# Patient Record
Sex: Female | Born: 1935 | Race: White | Hispanic: No | Smoking: Never smoker
Health system: Southern US, Community
[De-identification: ages and names within clinical notes are randomized; demographics above are authoritative.]

## PROBLEM LIST (undated history)

## (undated) DIAGNOSIS — I509 Heart failure, unspecified: Secondary | ICD-10-CM

## (undated) DIAGNOSIS — D509 Iron deficiency anemia, unspecified: Secondary | ICD-10-CM

## (undated) DIAGNOSIS — I10 Essential (primary) hypertension: Secondary | ICD-10-CM

## (undated) DIAGNOSIS — E785 Hyperlipidemia, unspecified: Secondary | ICD-10-CM

## (undated) DIAGNOSIS — I2510 Atherosclerotic heart disease of native coronary artery without angina pectoris: Secondary | ICD-10-CM

## (undated) DIAGNOSIS — K922 Gastrointestinal hemorrhage, unspecified: Secondary | ICD-10-CM

## (undated) DIAGNOSIS — K449 Diaphragmatic hernia without obstruction or gangrene: Secondary | ICD-10-CM

## (undated) DIAGNOSIS — K219 Gastro-esophageal reflux disease without esophagitis: Secondary | ICD-10-CM

## (undated) HISTORY — DX: Diaphragmatic hernia without obstruction or gangrene: K44.9

## (undated) HISTORY — PX: CHOLECYSTECTOMY: SHX55

## (undated) HISTORY — DX: Gastrointestinal hemorrhage, unspecified: K92.2

## (undated) HISTORY — DX: Essential (primary) hypertension: I10

## (undated) HISTORY — DX: Hyperlipidemia, unspecified: E78.5

## (undated) HISTORY — DX: Iron deficiency anemia, unspecified: D50.9

## (undated) HISTORY — PX: APPENDECTOMY: SHX54

## (undated) HISTORY — DX: Gastro-esophageal reflux disease without esophagitis: K21.9

## (undated) HISTORY — DX: Heart failure, unspecified: I50.9

## (undated) HISTORY — DX: Atherosclerotic heart disease of native coronary artery without angina pectoris: I25.10

---

## 2003-02-18 ENCOUNTER — Ambulatory Visit (HOSPITAL_COMMUNITY): Admission: RE | Admit: 2003-02-18 | Discharge: 2003-02-18 | Payer: Self-pay | Admitting: Pulmonary Disease

## 2003-11-04 ENCOUNTER — Ambulatory Visit (HOSPITAL_COMMUNITY): Admission: RE | Admit: 2003-11-04 | Discharge: 2003-11-04 | Payer: Self-pay | Admitting: Pulmonary Disease

## 2003-11-18 ENCOUNTER — Ambulatory Visit (HOSPITAL_COMMUNITY): Admission: RE | Admit: 2003-11-18 | Discharge: 2003-11-18 | Payer: Self-pay | Admitting: Pulmonary Disease

## 2003-11-21 ENCOUNTER — Ambulatory Visit (HOSPITAL_COMMUNITY): Admission: RE | Admit: 2003-11-21 | Discharge: 2003-11-21 | Payer: Self-pay | Admitting: Pulmonary Disease

## 2004-11-04 ENCOUNTER — Ambulatory Visit (HOSPITAL_COMMUNITY): Admission: RE | Admit: 2004-11-04 | Discharge: 2004-11-04 | Payer: Self-pay

## 2004-12-23 ENCOUNTER — Ambulatory Visit (HOSPITAL_COMMUNITY): Admission: RE | Admit: 2004-12-23 | Discharge: 2004-12-23 | Payer: Self-pay | Admitting: Pulmonary Disease

## 2006-08-15 HISTORY — PX: COLONOSCOPY: SHX174

## 2006-12-19 ENCOUNTER — Ambulatory Visit (HOSPITAL_COMMUNITY): Admission: RE | Admit: 2006-12-19 | Discharge: 2006-12-19 | Payer: Self-pay | Admitting: Pulmonary Disease

## 2007-01-15 ENCOUNTER — Ambulatory Visit: Payer: Self-pay | Admitting: Internal Medicine

## 2007-01-24 ENCOUNTER — Ambulatory Visit (HOSPITAL_COMMUNITY): Admission: RE | Admit: 2007-01-24 | Discharge: 2007-01-24 | Payer: Self-pay | Admitting: Internal Medicine

## 2007-01-24 ENCOUNTER — Encounter: Payer: Self-pay | Admitting: Internal Medicine

## 2007-01-24 ENCOUNTER — Ambulatory Visit: Payer: Self-pay | Admitting: Internal Medicine

## 2008-06-20 ENCOUNTER — Ambulatory Visit (HOSPITAL_COMMUNITY): Admission: RE | Admit: 2008-06-20 | Discharge: 2008-06-20 | Payer: Self-pay | Admitting: Pulmonary Disease

## 2008-07-14 ENCOUNTER — Ambulatory Visit (HOSPITAL_COMMUNITY): Admission: RE | Admit: 2008-07-14 | Discharge: 2008-07-14 | Payer: Self-pay | Admitting: Ophthalmology

## 2008-10-06 ENCOUNTER — Encounter (INDEPENDENT_AMBULATORY_CARE_PROVIDER_SITE_OTHER): Payer: Self-pay | Admitting: *Deleted

## 2008-10-06 LAB — CONVERTED CEMR LAB
ALT: 11 units/L
AST: 18 units/L
Chloride: 5.2 meq/L
Creatinine, Ser: 0.89 mg/dL
Total Protein: 6.6 g/dL

## 2008-10-08 ENCOUNTER — Ambulatory Visit (HOSPITAL_COMMUNITY): Admission: RE | Admit: 2008-10-08 | Discharge: 2008-10-08 | Payer: Self-pay | Admitting: Pulmonary Disease

## 2008-10-08 ENCOUNTER — Ambulatory Visit: Payer: Self-pay | Admitting: Cardiology

## 2008-10-08 ENCOUNTER — Encounter (INDEPENDENT_AMBULATORY_CARE_PROVIDER_SITE_OTHER): Payer: Self-pay | Admitting: Pulmonary Disease

## 2008-10-10 ENCOUNTER — Ambulatory Visit (HOSPITAL_COMMUNITY): Admission: RE | Admit: 2008-10-10 | Discharge: 2008-10-10 | Payer: Self-pay | Admitting: Pulmonary Disease

## 2008-11-10 ENCOUNTER — Ambulatory Visit: Payer: Self-pay | Admitting: Cardiology

## 2008-11-11 ENCOUNTER — Inpatient Hospital Stay (HOSPITAL_COMMUNITY): Admission: AD | Admit: 2008-11-11 | Discharge: 2008-11-12 | Payer: Self-pay | Admitting: Internal Medicine

## 2008-11-11 ENCOUNTER — Ambulatory Visit: Payer: Self-pay | Admitting: Cardiovascular Disease

## 2008-11-11 ENCOUNTER — Ambulatory Visit: Payer: Self-pay | Admitting: Cardiology

## 2008-11-20 ENCOUNTER — Ambulatory Visit: Payer: Self-pay | Admitting: Cardiology

## 2008-11-28 ENCOUNTER — Ambulatory Visit: Payer: Self-pay | Admitting: Cardiology

## 2008-12-03 ENCOUNTER — Ambulatory Visit: Payer: Self-pay | Admitting: Cardiology

## 2008-12-22 ENCOUNTER — Encounter (INDEPENDENT_AMBULATORY_CARE_PROVIDER_SITE_OTHER): Payer: Self-pay | Admitting: *Deleted

## 2008-12-22 LAB — CONVERTED CEMR LAB
BUN: 24 mg/dL
Chloride: 103 meq/L
Creatinine, Ser: 0.85 mg/dL
Glucose, Bld: 76 mg/dL

## 2008-12-29 ENCOUNTER — Ambulatory Visit: Payer: Self-pay | Admitting: Cardiology

## 2009-03-02 ENCOUNTER — Ambulatory Visit (HOSPITAL_COMMUNITY): Admission: RE | Admit: 2009-03-02 | Discharge: 2009-03-02 | Payer: Self-pay | Admitting: Ophthalmology

## 2009-08-18 ENCOUNTER — Encounter: Payer: Self-pay | Admitting: *Deleted

## 2009-08-18 DIAGNOSIS — E7800 Pure hypercholesterolemia, unspecified: Secondary | ICD-10-CM

## 2009-08-18 DIAGNOSIS — I509 Heart failure, unspecified: Secondary | ICD-10-CM | POA: Insufficient documentation

## 2009-08-18 DIAGNOSIS — I10 Essential (primary) hypertension: Secondary | ICD-10-CM

## 2009-08-19 ENCOUNTER — Ambulatory Visit: Payer: Self-pay | Admitting: Cardiology

## 2009-08-19 ENCOUNTER — Encounter (INDEPENDENT_AMBULATORY_CARE_PROVIDER_SITE_OTHER): Payer: Self-pay | Admitting: *Deleted

## 2009-08-19 DIAGNOSIS — I5042 Chronic combined systolic (congestive) and diastolic (congestive) heart failure: Secondary | ICD-10-CM

## 2009-08-19 DIAGNOSIS — I2510 Atherosclerotic heart disease of native coronary artery without angina pectoris: Secondary | ICD-10-CM

## 2009-10-19 ENCOUNTER — Emergency Department (HOSPITAL_COMMUNITY): Admission: EM | Admit: 2009-10-19 | Discharge: 2009-10-20 | Payer: Self-pay | Admitting: Emergency Medicine

## 2010-03-31 ENCOUNTER — Ambulatory Visit: Payer: Self-pay | Admitting: Cardiology

## 2010-09-14 NOTE — Miscellaneous (Signed)
Summary: labs bmp,12/22/2008  Clinical Lists Changes  Observations: Added new observation of CALCIUM: 8.8 mg/dL (52/84/1324 40:10) Added new observation of CREATININE: 0.85 mg/dL (27/25/3664 40:34) Added new observation of BUN: 24 mg/dL (74/25/9563 87:56) Added new observation of BG RANDOM: 76 mg/dL (43/32/9518 84:16) Added new observation of CO2 PLSM/SER: 29 meq/L (12/22/2008 10:05) Added new observation of CL SERUM: 103 meq/L (12/22/2008 10:05) Added new observation of K SERUM: 4.4 meq/L (12/22/2008 10:05) Added new observation of NA: 138 meq/L (12/22/2008 10:05) Added new observation of CALCIUM: 9.5 mg/dL (60/63/0160 10:93) Added new observation of ALBUMIN: 4.4 g/dL (23/55/7322 02:54) Added new observation of PROTEIN, TOT: 6.6 g/dL (27/01/2375 28:31) Added new observation of SGPT (ALT): 11 units/L (10/06/2008 10:05) Added new observation of SGOT (AST): 18 units/L (10/06/2008 10:05) Added new observation of ALK PHOS: 72 units/L (10/06/2008 10:05) Added new observation of CREATININE: 0.89 mg/dL (51/76/1607 37:10) Added new observation of BUN: 27 mg/dL (62/69/4854 62:70) Added new observation of BG RANDOM: 72 mg/dL (35/00/9381 82:99) Added new observation of CO2 PLSM/SER: 104 meq/L (10/06/2008 10:05) Added new observation of CL SERUM: 5.2 meq/L (10/06/2008 10:05) Added new observation of K SERUM: 144 meq/L (10/06/2008 10:05)

## 2010-09-14 NOTE — Assessment & Plan Note (Signed)
Summary: Z6X   Visit Type:  Follow-up Primary Provider:  Dr.Edward Juanetta Gosling   History of Present Illness: Susan Glass returns today for evaluation management of her chronic combined congestive heart failure, nonobstructive coronary artery disease, moderate pulmonary hypertension, lower extremity edema, and hypertension.  She be 77 years of age this April. She remains very active and independent. She has had no chest discomfort, shortness of breath, her only limiting factor is her left knee.  In May 16, 2023, her husband died of lung cancer. She is very lonely and was tearful today. She lives alone. She does have a son and several grandchildren that check on her. Her memory is good and she is not prone to fall.  Current Medications (verified): 1)  Amlodipine Besylate 2.5 Mg Tabs (Amlodipine Besylate) .... Take One Tablet By Mouth Daily 2)  Furosemide 40 Mg Tabs (Furosemide) .... Take 1 Tab Daily 3)  Vitamin B-12 Cr 2000 Mcg Cr-Tabs (Cyanocobalamin) .... Take 1 Tab Alternating With 2 Next Day 4)  Vitamin D3 1000000 Unit/gm Liqd (Cholecalciferol) .... Take 1 Tab Daily 5)  Ferrous Sulfate 325 (65 Fe) Mg Tbec (Ferrous Sulfate) .... Take 1 Tab Daily 6)  Aspirin 325 Mg Tabs (Aspirin) .... Take 1/4 Tab Daily  Allergies (verified): No Known Drug Allergies  Past History:  Past Medical History: Last updated: 2009/09/05 Current Problems:  HYPERTENSION (ICD-401.9) HYPERCHOLESTEROLEMIA (ICD-272.0) CAD (ICD-414.00) CHF (ICD-428.0)  Past Surgical History: Last updated: 09-05-2009 cholecystectomy appendectomy  Family History: Last updated: 09/05/09 Father:deceased age 67 due to unatural causes Mother:deceased age 72 chf  Social History: Last updated: 09-05-09 Retired  Married  Tobacco Use - No.  Alcohol Use - no Regular Exercise - no Drug Use - no  Risk Factors: Exercise: no (2009-09-05)  Risk Factors: Smoking Status: never (2009-09-05)  Review of Systems       negative  other than history of present illness  Vital Signs:  Patient profile:   76 year old female Height:      61 inches Weight:      133 pounds Pulse rate:   71 / minute BP sitting:   136 / 76  (right arm)  Vitals Entered By: Dreama Saa, CNA (August 19, 2009 1:21 PM)  Physical Exam  General:  elderly, very sharp Head:  normocephalic and atraumatic Eyes:  PERRLA/EOM intact; conjunctiva and lids normal. Neck:  Neck supple, no JVD. No masses, thyromegaly or abnormal cervical nodes. Lungs:  Clear bilaterally to auscultation and percussion. Heart:  Non-displaced PMI, chest non-tender; regular rate and rhythm, S1, S2 without murmurs, rubs or gallops. Carotid upstroke normal, no bruit. Normal abdominal aortic size, no bruits. Femorals normal pulses, no bruits. Pedals normal pulses. No edema, no varicosities. Msk:  decreased ROM.   Pulses:  pulses normal in all 4 extremities Extremities:  No clubbing or cyanosis. Neurologic:  Alert and oriented x 3. Skin:  Intact without lesions or rashes. Psych:  Normal affect.   Problems:  Medical Problems Added: 1)  Dx of Cad, Native Vessel  (ICD-414.01) 2)  Dx of Combined Heart Failure, Chronic  (ICD-428.42)  Impression & Recommendations:  Problem # 1:  COMBINED HEART FAILURE, CHRONIC (ICD-428.42) Assessment Unchanged  Her updated medication list for this problem includes:    Amlodipine Besylate 2.5 Mg Tabs (Amlodipine besylate) .Marland Kitchen... Take one tablet by mouth daily    Furosemide 40 Mg Tabs (Furosemide) .Marland Kitchen... Take 1 tab daily    Aspirin 325 Mg Tabs (Aspirin) .Marland Kitchen... Take 1/4 tab daily  Problem # 2:  HYPERTENSION (ICD-401.9) Assessment: Improved  Her updated medication list for this problem includes:    Amlodipine Besylate 2.5 Mg Tabs (Amlodipine besylate) .Marland Kitchen... Take one tablet by mouth daily    Furosemide 40 Mg Tabs (Furosemide) .Marland Kitchen... Take 1 tab daily    Aspirin 325 Mg Tabs (Aspirin) .Marland Kitchen... Take 1/4 tab daily  Problem # 3:  CAD, NATIVE  VESSEL (ICD-414.01) Assessment: Unchanged  Her updated medication list for this problem includes:    Amlodipine Besylate 2.5 Mg Tabs (Amlodipine besylate) .Marland Kitchen... Take one tablet by mouth daily    Aspirin 325 Mg Tabs (Aspirin) .Marland Kitchen... Take 1/4 tab daily  Patient Instructions: 1)  Your physician recommends that you schedule a follow-up appointment in: 6 months

## 2010-09-14 NOTE — Assessment & Plan Note (Signed)
Summary: V4U   Visit Type:  Follow-up Primary Susan Glass:  Susan Glass  CC:  no cardiology complaints.  History of Present Illness: Susan Glass returns today for evaluation of her coronary disease, hypertension and palpitations.  She has no complaints today. Her husband died about 6 months ago and she now lives in an apartment which is made life a lot easier. She is quite lonely.  She denies any angina or chest pain. She still quite active. She goes to church almost every Sunday. She denies orthopnea, PND or edema. She has not fallen. She is very compliant with her medications.  Clinical Reports Reviewed:  Cardiac Cath:  11/11/2008: Cardiac Cath Findings:   IMPRESSION:   1. Mild nonobstructive coronary artery disease.   2. Mild global left ventricular systolic dysfunction.   3. Mild-to-moderate elevation in pulmonary artery pressure.   4. Elevated wedge pressure.      RECOMMENDATIONS:  I will transfer the patient to the telemetry unit   following the catheterization.  We will begin mild diuresis and aim for   better blood pressure control.  I would also like to perform a surface   echocardiogram prior to discharge to get another assessment of her   overall left ventricular systolic function.  This would also be helpful   to look for abnormal relaxation and possible diastolic dysfunction as an   etiology of her chest discomfort.  I also would like to have another   assessment of her mitral regurgitation.               Christopher McAlhany, MD   Electronically Signed    Current Medications (verified): 1)  Amlodipine Besylate 2.5 Mg Tabs (Amlodipine Besylate) .... Take One Tablet By Mouth Daily 2)  Furosemide 40 Mg Tabs (Furosemide) .... Take 1 Tab Daily 3)  Ferrous Sulfate 325 (65 Fe) Mg Tbec (Ferrous Sulfate) .... Take Prn 4)  Aspirin 325 Mg Tabs (Aspirin) .... Take 1/4 Tab Daily 5)  Fish Oil 1000 Mg Caps (Omega-3 Fatty Acids) .... Take 2 Tabs Daily  Allergies  (verified): No Known Drug Allergies  Review of Systems       negative other than history of present illness  Vital Signs:  Patient profile:   77 year old female Weight:      128 pounds BMI:     24 .27 Pulse rate:   41 / minute BP sitting:   141 / 70  (right arm)  Vitals Entered By: Dreama Saa, CNA (March 31, 2010 4:00 PM)  Physical Exam  General:  elderly, alert and oriented, in no acute distress Head:  normocephalic and atraumatic Eyes:  Lasix is otherwise normal Neck:  Neck supple, no JVD. No masses, thyromegaly or abnormal cervical nodes. Chest Wall:  no deformities or breast masses noted Lungs:  Clear bilaterally to auscultation and percussion. Heart:  PMI nondisplaced, irregular rate and rhythm, heart rate 60 Msk:  decreased ROM.   Pulses:  pulses normal in all 4 extremities Extremities:  trace left pedal edema and trace right pedal edema.   Neurologic:  Alert and oriented x 3. Skin:  Intact without lesions or rashes. Psych:  depressed affect.     Impression & Recommendations:  Problem # 1:  CAD, NATIVE VESSEL (ICD-414.01)  Her updated medication list for this problem includes:    Amlodipine Besylate 2.5 Mg Tabs (Amlodipine besylate) .Marland Kitchen... Take one tablet by mouth daily    Aspirin 325 Mg Tabs (Aspirin) .Marland Kitchen... Take 1/4 tab daily  Her  updated medication list for this problem includes:    Amlodipine Besylate 2.5 Mg Tabs (Amlodipine besylate) .Marland Kitchen... Take one tablet by mouth daily    Aspirin 325 Mg Tabs (Aspirin) .Marland Kitchen... Take 1/4 tab daily  Problem # 2:  COMBINED HEART FAILURE, CHRONIC (ICD-428.42)  Her updated medication list for this problem includes:    Amlodipine Besylate 2.5 Mg Tabs (Amlodipine besylate) .Marland Kitchen... Take one tablet by mouth daily    Furosemide 40 Mg Tabs (Furosemide) .Marland Kitchen... Take 1 tab daily    Aspirin 325 Mg Tabs (Aspirin) .Marland Kitchen... Take 1/4 tab daily  Her updated medication list for this problem includes:    Amlodipine Besylate 2.5 Mg Tabs (Amlodipine  besylate) .Marland Kitchen... Take one tablet by mouth daily    Furosemide 40 Mg Tabs (Furosemide) .Marland Kitchen... Take 1 tab daily    Aspirin 325 Mg Tabs (Aspirin) .Marland Kitchen... Take 1/4 tab daily  Problem # 3:  HYPERTENSION (ICD-401.9)  Her updated medication list for this problem includes:    Amlodipine Besylate 2.5 Mg Tabs (Amlodipine besylate) .Marland Kitchen... Take one tablet by mouth daily    Furosemide 40 Mg Tabs (Furosemide) .Marland Kitchen... Take 1 tab daily    Aspirin 325 Mg Tabs (Aspirin) .Marland Kitchen... Take 1/4 tab daily  Her updated medication list for this problem includes:    Amlodipine Besylate 2.5 Mg Tabs (Amlodipine besylate) .Marland Kitchen... Take one tablet by mouth daily    Furosemide 40 Mg Tabs (Furosemide) .Marland Kitchen... Take 1 tab daily    Aspirin 325 Mg Tabs (Aspirin) .Marland Kitchen... Take 1/4 tab daily  Problem # 4:  HYPERCHOLESTEROLEMIA (ICD-272.0)  Patient Instructions: 1)  Your physician recommends that you schedule a follow-up appointment in: 6 months 2)  Your physician recommends that you continue on your current medications as directed. Please refer to the Current Medication list given to you today.

## 2010-11-07 LAB — CBC
MCHC: 34.9 g/dL (ref 30.0–36.0)
MCV: 93.7 fL (ref 78.0–100.0)
Platelets: 162 10*3/uL (ref 150–400)
RDW: 14.5 % (ref 11.5–15.5)

## 2010-11-07 LAB — POCT CARDIAC MARKERS
CKMB, poc: 2.1 ng/mL (ref 1.0–8.0)
Myoglobin, poc: 88 ng/mL (ref 12–200)

## 2010-11-07 LAB — DIFFERENTIAL
Basophils Absolute: 0 10*3/uL (ref 0.0–0.1)
Basophils Relative: 0 % (ref 0–1)
Eosinophils Absolute: 0.1 10*3/uL (ref 0.0–0.7)
Neutro Abs: 3.9 10*3/uL (ref 1.7–7.7)
Neutrophils Relative %: 60 % (ref 43–77)

## 2010-11-07 LAB — BASIC METABOLIC PANEL
BUN: 19 mg/dL (ref 6–23)
CO2: 27 mEq/L (ref 19–32)
Calcium: 8.8 mg/dL (ref 8.4–10.5)
Creatinine, Ser: 0.84 mg/dL (ref 0.4–1.2)
GFR calc Af Amer: >60 mL/min (ref >60)
Glucose, Bld: 103 mg/dL — ABNORMAL HIGH (ref 70–99)

## 2010-11-17 ENCOUNTER — Encounter: Payer: Self-pay | Admitting: Adult Health

## 2010-11-17 ENCOUNTER — Ambulatory Visit (INDEPENDENT_AMBULATORY_CARE_PROVIDER_SITE_OTHER): Payer: Medicare Other | Admitting: Adult Health

## 2010-11-17 DIAGNOSIS — R609 Edema, unspecified: Secondary | ICD-10-CM

## 2010-11-17 DIAGNOSIS — R600 Localized edema: Secondary | ICD-10-CM | POA: Insufficient documentation

## 2010-11-17 DIAGNOSIS — I2510 Atherosclerotic heart disease of native coronary artery without angina pectoris: Secondary | ICD-10-CM

## 2010-11-17 DIAGNOSIS — I5042 Chronic combined systolic (congestive) and diastolic (congestive) heart failure: Secondary | ICD-10-CM

## 2010-11-17 DIAGNOSIS — I10 Essential (primary) hypertension: Secondary | ICD-10-CM

## 2010-11-17 NOTE — Patient Instructions (Signed)
Your physician recommends that you schedule a follow-up appointment in: 6 months Your physician recommends that you return for lab work in: today Your physician recommends that you continue on your current medications as directed. Please refer to the Current Medication list given to you today.

## 2010-11-17 NOTE — Progress Notes (Signed)
   Patient ID: Susan Glass, female    DOB: 1917-07-02, 77 y.o.   MRN: [id number]  HPI Susan Glass is a pleasant lady we are following for ongoing assessment and treatment of CAD, hypertension, and palpitations. She has a history of nonobstructive CAD per cardiac cath in 2010 but was found to have mild diastolic dysfunction.  She also has a history of debilitating arthritis which causes her to be quite sedentary.  She comes today with complaints of LEE that has been ongoing for about one month.  She has also had some skin cancer lesions removed from her lower extremities around the ankles bilaterally.  She is complaining of some discomfort with that as well.  She denies chest pain, shortness of breath, dizziness.  She is a little anxious, but once we talk a while she calms down.   Upon review of her medications, it is found that she is taking Alleve twice a day. She uses this for arthritis.  This is a new medication for her, as she was taking Tylenol Arthritis.    Review of Systems Review of systems complete and found to be negative unless listed above    Physical Exam General: Well developed, well nourished, in no acute distress Head: Eyes PERRLA, No xanthomas.   Normal cephalic and atramatic  Lungs: Clear bilaterally to auscultation and percussion. Heart: HRRR S1 S2,1/6 systolic murmur..  Pulses are 2+ & equal.            No carotid bruit. No JVD.  No abdominal bruits. No femoral bruits. Abdomen: Bowel sounds are positive, abdomen soft and non-tender without masses or                  Hernia's noted. Msk:  Kyphosis is noted, normal gait. Normal strength and tone for age. Healing skin wounds from            Recent removal of skin cancer lesions on her ankles. Extremities: No clubbing, cyanosis or edema.  DP +1 Neuro: Alert and oriented X 3. Psych:  Good affect, responds appropriately    Current Outpatient Prescriptions on File Prior to Visit  Medication Sig Dispense Refill  . amLODipine  (NORVASC) 2.5 MG tablet Take 2.5 mg by mouth daily.        . ferrous sulfate 325 (65 FE) MG tablet Take 325 mg by mouth daily with breakfast.        . furosemide (LASIX) 40 MG tablet Take 40 mg by mouth daily.        . Omega-3 Fatty Acids (FISH OIL) 1000 MG CAPS Take 1 capsule by mouth daily.        Marland Kitchen DISCONTD: aspirin 325 MG tablet Take 81 mg by mouth daily.

## 2010-11-17 NOTE — Assessment & Plan Note (Signed)
She is stable from cardiac standpoint.  No recurrence of chest pain, no increased DOE.  She is to continue current medications as directed.

## 2010-11-17 NOTE — Assessment & Plan Note (Signed)
Currently well controled.  She wants to go up on the lasix and I have advised against this, since she is stopping the Alleve. I do not want her to become dehydrated or hypotensive.

## 2010-11-18 LAB — BASIC METABOLIC PANEL
BUN: 23 mg/dL (ref 6–23)
Creat: 0.87 mg/dL (ref 0.40–1.20)
Glucose, Bld: 93 mg/dL (ref 70–99)

## 2010-11-21 LAB — HEMOGLOBIN AND HEMATOCRIT, BLOOD
HCT: 37.6 % (ref 36.0–46.0)
Hemoglobin: 13 g/dL (ref 12.0–15.0)

## 2010-11-21 LAB — BASIC METABOLIC PANEL
CO2: 28 mEq/L (ref 19–32)
Calcium: 9.2 mg/dL (ref 8.4–10.5)
Creatinine, Ser: 0.85 mg/dL (ref 0.4–1.2)
GFR calc Af Amer: >60 mL/min (ref >60)
GFR calc non Af Amer: >60 mL/min (ref >60)
Glucose, Bld: 120 mg/dL — ABNORMAL HIGH (ref 70–99)
Sodium: 142 mEq/L (ref 135–145)

## 2010-11-25 ENCOUNTER — Telehealth: Payer: Self-pay | Admitting: Adult Health

## 2010-11-25 LAB — POCT I-STAT 3, ART BLOOD GAS (G3+)
O2 Saturation: 91 %
TCO2: 23 mmol/L (ref 0–100)
pCO2 arterial: 36.6 mmHg (ref 35.0–45.0)
pH, Arterial: 7.383 (ref 7.350–7.400)

## 2010-11-25 LAB — POCT I-STAT 3, VENOUS BLOOD GAS (G3P V)
O2 Saturation: 63 %
TCO2: 24 mmol/L (ref 0–100)
pCO2, Ven: 38.9 mmHg — ABNORMAL LOW (ref 45.0–50.0)
pH, Ven: 7.383 — ABNORMAL HIGH (ref 7.250–7.300)

## 2010-11-25 LAB — CBC
HCT: 37.1 % (ref 36.0–46.0)
Hemoglobin: 12.7 g/dL (ref 12.0–15.0)
MCHC: 34.2 g/dL (ref 30.0–36.0)
MCV: 92.4 fL (ref 78.0–100.0)
Platelets: 123 10*3/uL — ABNORMAL LOW (ref 150–400)
Platelets: 135 10*3/uL — ABNORMAL LOW (ref 150–400)
RDW: 13.4 % (ref 11.5–15.5)
RDW: 13.7 % (ref 11.5–15.5)
WBC: 6.1 10*3/uL (ref 4.0–10.5)

## 2010-11-25 LAB — HEPARIN LEVEL (UNFRACTIONATED): Heparin Unfractionated: <0.1 IU/mL — ABNORMAL LOW (ref 0.30–0.70)

## 2010-11-25 LAB — COMPREHENSIVE METABOLIC PANEL
Albumin: 3.3 g/dL — ABNORMAL LOW (ref 3.5–5.2)
Alkaline Phosphatase: 58 U/L (ref 39–117)
BUN: 18 mg/dL (ref 6–23)
Creatinine, Ser: 0.79 mg/dL (ref 0.4–1.2)
Glucose, Bld: 82 mg/dL (ref 70–99)
Total Protein: 5.6 g/dL — ABNORMAL LOW (ref 6.0–8.3)

## 2010-11-25 LAB — LIPID PANEL
LDL Cholesterol: 76 mg/dL (ref 0–99)
Total CHOL/HDL Ratio: 3.3 RATIO
Triglycerides: 68 mg/dL (ref <150)
VLDL: 14 mg/dL (ref 0–40)

## 2010-11-25 LAB — CARDIAC PANEL(CRET KIN+CKTOT+MB+TROPI)
CK, MB: 2.7 ng/mL (ref 0.3–4.0)
Troponin I: <0.01 ng/mL (ref 0.00–0.06)

## 2010-11-25 LAB — PROTIME-INR
INR: 1.2 (ref 0.00–1.49)
Prothrombin Time: 15.3 seconds — ABNORMAL HIGH (ref 11.6–15.2)

## 2010-11-25 LAB — BASIC METABOLIC PANEL
BUN: 15 mg/dL (ref 6–23)
Calcium: 8.5 mg/dL (ref 8.4–10.5)
Creatinine, Ser: 0.87 mg/dL (ref 0.4–1.2)
GFR calc non Af Amer: >60 mL/min (ref >60)
Glucose, Bld: 91 mg/dL (ref 70–99)

## 2010-11-25 LAB — APTT: aPTT: 33 seconds (ref 24–37)

## 2010-11-25 NOTE — Telephone Encounter (Signed)
**Note De-Identified  Obfuscation** Telephone number provided [phone]) has been disconnected, results letter mailed to patient's address with a note asking pt. to contact this office with current phone number.

## 2010-11-25 NOTE — Telephone Encounter (Signed)
PT CALLING FOR LAB RESULTS

## 2010-11-30 ENCOUNTER — Telehealth: Payer: Self-pay | Admitting: Adult Health

## 2010-11-30 LAB — BLOOD GAS, ARTERIAL
FIO2: 0.21 %
Patient temperature: 37
TCO2: 21 mmol/L (ref 0–100)
pCO2 arterial: 34.8 mmHg — ABNORMAL LOW (ref 35.0–45.0)
pH, Arterial: 7.453 — ABNORMAL HIGH (ref 7.350–7.400)

## 2010-11-30 NOTE — Telephone Encounter (Signed)
Patient would like results of lab work / tg  °

## 2010-12-01 ENCOUNTER — Other Ambulatory Visit: Payer: Self-pay | Admitting: Cardiology

## 2010-12-28 NOTE — Procedures (Signed)
NAME:  Susan Glass, Susan Glass                ACCOUNT NO.:  [id number]   MEDICAL RECORD NO.:  [id number]          PATIENT TYPE:  INP   LOCATION:  2002                         FACILITY:  [facility]   PHYSICIAN:  Jesse Sans. Wall, MD, FACCDATE OF BIRTH:  01-21-1917   DATE OF PROCEDURE:  11/11/2008  DATE OF DISCHARGE:                                  STRESS TEST   INDICATIONS:  PVCs and dyspnea on exertion.   Susan Glass had a baseline blood pressure of 168/66.  Based on EKG, it  was normal except for PVCs.  At one point, she had ventricular bigeminy  which we have been seeing in the office the day before.   She exercised in a modified Bruce protocol.  One minute and 48 seconds  in exercise she experienced dizziness.  At that point, her heart rate  was about 110 which is close to 85% of predicted maximum.  There were no  ST-segment changes though her ectopy remained.  Right after we stopped  the treadmill, she had 1 brief period where she was having no  ventricular ectopy.   At the end of exercise, her blood pressure had dropped to 142/80.   About 2 minutes into recovery, she was back in ventricular bigeminy with  no ST-segment changes.  Her blood pressures came back up.   ASSESSMENT:  1. Extreme poor exercise tolerance.  2. Decrease in blood pressure with dizziness and shortness of breath,      rule out severe coronary artery disease.  3. No suppression of ventricular bigeminy with exercise up to a heart      rate of 110.  4. No ST-segment changes.   In retrospect, Susan Glass tells me she has been taking Actifed.  She  took 1 this morning for a cold.  In addition, she also tells me now she  has had some chest tightness off and on during the night.  I am very  concerned about severe coronary artery disease.   PLAN:  Transfer to [facility] for cardiac catheterization.  [facility] has  been notified and will arrange transport to a step-down bed.  We will go  ahead and heparinize.  She takes  aspirin.      Thomas C. Daleen Squibb, MD, [facility]  Electronically Signed     TCW/MEDQ  D:  11/11/2008  T:  11/12/2008  Job:  [id number]

## 2010-12-28 NOTE — H&P (Signed)
NAME:  Susan, Glass                ACCOUNT NO.:  [id number]   MEDICAL RECORD NO.:  [id number]          PATIENT TYPE:  AMB   LOCATION:  DAY                           FACILITY:  [facility]   PHYSICIAN:  R. Roetta Sessions, M.D. DATE OF BIRTH:  01-10-1917   DATE OF ADMISSION:  DATE OF DISCHARGE:  LH                              HISTORY & PHYSICAL   REASON FOR CONSULTATION:  Abdominal discomfort, diarrhea, iron  deficiency anemia.   HISTORY OF PRESENT ILLNESS:  [name]. Susan Glass is a very pleasant 13-  year-old Caucasian female with no prior GI issues aside from getting her  gallbladder out many years ago who, over the past couple of months,  developed abdominal discomfort and cramps and nonbloody diarrhea.  She  temporarily relates these symptoms to taking a nonsteroidal agent (query  meloxicam) for arthritis.  She stopped taking the medication and the  above-mentioned GI symptoms have resolved.  She has not had any melena  or rectal bleeding, although Dr. Juanetta Gosling documented her to be iron  deficient back in January with iron level of 29, saturation 8%, ferritin  10, H&H was 10.6 and 33.4, MCV was 83.9.  She was also of B12 deficiency  now on supplementation. She denies melena or hematochezia.  As far as I  known, she is not known to be hemoccult positive.  She has not lost any  weight.  She has never had her lower GI tract evaluated.  There is no  family history of colorectal neoplasia.  She denies any outpatient  dysphagia or early signs of reflux symptoms, nausea or vomiting.   PAST MEDICAL HISTORY:  She has enjoyed very good health over the years.  She is status post cholecystectomy many years ago, status post  appendectomy many years ago.   CURRENT MEDICATIONS:  1. B12 supplement.  2. Fish oil supplement.  3. ASA 81 mg daily.  4. Fluid pill daily.   ALLERGIES:  NO KNOWN DRUG ALLERGIES   FAMILY HISTORY:  Mother died with congestive heart failure age 62.  Father died with old age  at age 61.  One sister 66 with Alzheimer's  disease.  There is no history chronic GI or liver illness.   SOCIAL HISTORY:  The patient is married, has one son.  She lost her  first husband to cancer at 76 years of marriage.  She is currently her  second marriage of 14 years duration.  No tobacco, no alcohol, no  illicit drugs.   REVIEW OF SYSTEMS:  She walks two miles every day.  She is a Futures trader.  No chest pain.  No dyspnea on exertion.   PHYSICAL EXAMINATION:  GENERAL:  Reveals a pleasant, 77 year old lady,  alert, conversant, oriented.  VITAL SIGNS:  Weight 130.5, temperature 98.1, BP 148/80, pulse 64.  SKIN:  Warm and dry.  There is no jaundice or stigmata of chronic liver  disease.  HEENT:  No scleral icterus. JVD is not prominent.  CHEST:  Lungs are clear to auscultation.  CARDIAC:  Regular rate and rhythm without murmur, rub.  BREAST:  Exams  deferred.  ABDOMEN:  Nondistended.  Positive bowel sounds, soft, nontender without  appreciable mass or organomegaly.  EXTREMITIES:  No edema.  RECTAL:  Deferred for time of colonoscopy.   IMPRESSION:  [name] is a pleasant 77 year old lady with a  history of microcytic anemia.  She has had some nonspecific abdominal  symptoms and diarrhea, at least temporally related to what is believed  nonsteroidal therapy.  This agent was withdrawn, and the above-mentioned  GI symptoms have resolved.  I am going to start with the fact that this  lady has never had a colonoscopy.  It is certainly possible, if she was  taking a nonsteroidal this could cause upheaval to her entire GI tract  and could have caused enteritis or even a colitis with nonbloody  diarrhea.   It is certainly also possible she could have suffered NSAID gastropathy  with nonsteroidal administration.  However, symptoms she originally  complained of have now unfortunately resolved.   I told [name] she ought to go ahead and have a colonoscopy.  Potential risks,  benefits, alternatives have been reviewed, questions  answered.  She is agreeable.  Her questions were answered.  Will set up  a colonoscopy in the very near future.   I would like to thank Dr. Juanetta Gosling for allowing me to see this nice lady  today.   Please note this lady was originally scheduled on Dec 26, 2006, but she  got her appointments fouled up and inadvertently missed her appointment  in early May.      Jonathon Bellows, M.D.  Electronically Signed     RMR/MEDQ  D:  01/15/2007  T:  01/15/2007  Job:  [id number]   cc:   Ramon Dredge L. Juanetta Gosling, M.D.  Fax: [phone]

## 2010-12-28 NOTE — Assessment & Plan Note (Signed)
[facility]                       [place] CARDIOLOGY OFFICE NOTE   NAME:Susan Glass, Susan Glass                       MRN:          [id number]  DATE:12/03/2008                            DOB:          28-May-1917    Ms. Turay comes in today as a walk-in for chest discomfort that lasted  since 1:30 this morning.  She said she felt like she had a brick  pressure on her chest.   She denied any nausea, vomiting, diaphoresis, or shortness of breath.  She denied any reflux symptoms such as water brash or heartburn.  She  denies any melena or hematochezia.   She did not get her amlodipine filled it 2.5 mg a day as we had asked  her.  She has been eating a lot of potassium-laden fruits and her  potassium is 5.9 on December 01, 2008.  Her BUN was 30 and creatinine was  0.87.  We advised her to cut way back on her potassium.   MEDICATIONS:  She is currently on;  1. Aspirin 81 mg a day.  2. Furosemide 20-40 mg every other day.   PHYSICAL EXAMINATION:  VITAL SIGNS:  Today, her blood pressure is  136/70, her pulse is 60 and she is again in ventricular bigeminy by EKG  with no acute changes. Weight is 128 and stable.  GENERAL:  She is in no acute distress.  SKIN:  Warm and dry.  HEENT:  Unchanged.  NECK:  Carotid upstrokes are equal bilaterally without bruits.  Thyroid  is not enlarged.  Trachea is midline.  LUNGS:  Clear to auscultation and percussion with no rub and no absent  breath sounds.  HEART:  A irregular rate and rhythm.  No gallop.  There is no rub.  ABDOMEN:  Soft, good bowel sounds.  No tenderness.  EXTREMITIES:  No cyanosis, clubbing, or edema.  There is no sign of DVT.  NEURO:  Intact.   ASSESSMENT:  Chest discomfort with minimal coronary artery disease.  Her  EKG shows no ST-segment changes, just persistent ventricular bigeminy  which has been present since the initial evaluation.  I suspect, she has  had some esophageal spasm or reflux.    PLAN:  1. Begin amlodipine 2.5 mg a day for blood pressure and for esophageal      spasm.  2. Begin omeprazole 40 mg p.o. daily  3. Tums p.r.n. which she has at home.  4. Continue with her other medications, except cut back on potassium-      rich fruits and      vegetables.  5. Check a BMET in a week.     Thomas C. Daleen Squibb, MD, [facility]  Electronically Signed    TCW/MedQ  DD: 12/03/2008  DT: 12/03/2008  Job #: [id number]   cc:   Ramon Dredge L. Juanetta Gosling, M.D.

## 2010-12-28 NOTE — Assessment & Plan Note (Signed)
[facility]                       [place] CARDIOLOGY OFFICE NOTE   NAME:Susan Glass, Susan Glass                       MRN:          [id number]  DATE:11/10/2008                            DOB:          10/19/1916    Susan Glass is a delightful 77 year old younger appearing than stated  age, married white female, who comes in today with shortness of breath  and chest discomfort.  Her real complaint is shortness of breath.   She saw Dr. Juanetta Gosling and has had pulmonary function studies, which were  normal.  A chest x-ray a year ago was nonrevealing.  In addition, she  has had a 2-D echocardiogram, which also was normal except for some mild  increase in her pulmonary pressures, being around a peak of 42 mmHg, and  mildly reduced right ventricular systolic function.  She has some mild  mitral regurgitation, mild-to-moderate enlargement of her left atrium.  Inferior vena cava was upper limits of normal and respiratory phasic  changes were blunted.   She does not smoke.  She has not had a history of any arrhythmias  including atrial fib.  There is no history of pulmonary emboli.   Her biggest complaint is that of giving out with shortness of breath  about a quarter mile into her walk with her husband.  They walked at  relatively slow pace, but she has always been able to walk further than  this.  On one occasion, she had some uncomfortable feeling in the center  of her chest, which she cannot really describe per se.  It was certainly  of about 30 minutes duration, which slowly went away.  Rest of the time,  she has not had this.   At time, she will have this discomfort just sitting and doing nothing.   She denies any nausea, vomiting, or diaphoresis.  She has had no  hemoptysis, hematemesis, difficulty swallowing, gastroesophageal reflux,  change in bowel habits, and melena.   PAST MEDICAL HISTORY:  She has no known drug allergies.   MEDICATIONS:  She  currently takes:  1. Aspirin 81 mg a day.  2. Torsemide 50 mg a day for lower extremity edema, which she has been      on for some time she says.   The only surgery she lists is colonoscopy.   SOCIAL HISTORY:  She does not smoke or drink.  She is retired.  She has  lost her first husband with a cancer and has been remarried for 16  years.  She says they are still on their honeymoon.  She has 1 child.   FAMILY HISTORY:  Really noncontributory at this point.   REVIEW OF SYSTEMS:  She has had left eye cataract surgery.  She wears  glasses or corrective lenses.  She wears bilateral hearing aids.  Her  teeth are in good shape and she has no dentures.  The rest of her review  of systems are totally negative except for some arthritis in her knees.   PHYSICAL EXAMINATION:  VITAL SIGNS:  Blood pressure 140/74, her pulse is  81 and irregular.  HEENT:  Atraumatic, normocephalic.  She is mildly overweight.  HEENT:  Essentially normal otherwise.  NECK:  Carotids were equal bilateral without bruits.  Thyroid is not  enlarged.  Trachea is midline.  There is no obvious JVD.  LUNGS:  Remarkable for some expiratory crackles in both bases.  She has  some minimal wheezing.  She has a slight cough and a nasal drip.  HEART:  Normal S1 and S2 with a irregular rhythm consistent with  bigeminy.  ABDOMEN:  Soft.  Good bowel sounds.  No midline bruit.  No hepatomegaly.  EXTREMITIES:  No major edema maybe trace to 1+.  She has a lot of venous  varicosities.  No sign of DVT.  No calf tenderness.  Pulses were brisk  2+ or 4+, but dorsalis pedis and posterior tibial.  NEUROLOGIC:  Grossly intact.   EKG confirms sinus rhythm with ventricular bigeminy, which is of  unifocal source.  She has some left axis deviation.  There is no ST-  segment changes.  PR, QRS, and QTc intervals are otherwise normal.   ASSESSMENT:  Dyspnea on exertion.  Some of this or all of this could be  due to her ventricular bigeminy with  ineffective heart rate of about 40.  It would be interesting to see what she does with increased activity and  reproduce her symptoms.  She could also have some obstructive coronary  artery disease, considering her age and then 1 or 2 episodes of chest  tightness or discomfort.   PLAN:  GXT, modified, by me, to see the response of her heart rate and  whether her ventricular ectopy worsens or goes away.  I would like to  try to reproduce her symptoms to correlate.   This is not felt to be the major cause after the treadmill, we will  proceed with cardiac catheterization.  She is agreeable.     Thomas C. Daleen Squibb, MD, [facility]  Electronically Signed    TCW/MedQ  DD: 11/10/2008  DT: 11/11/2008  Job #: [id number]   cc:   Ramon Dredge L. Juanetta Gosling, M.D.

## 2010-12-28 NOTE — Assessment & Plan Note (Signed)
[facility]                       [place] CARDIOLOGY OFFICE NOTE   NAME:Susan Glass, Susan Glass                       MRN:          [id number]  DATE:12/29/2008                            DOB:          Feb 21, 1917    Ms. Menendez returns today for her dyspnea, elevated pulmonary capillary  wedge pressure, nonobstructive coronary disease, and hypertension.   She says she feels remarkably better.  She denies any orthopnea, PND,  but does have a little bit of pedal edema, particularly in the left  lower extremity at the end of the day.  It goes down at night.  Her  dyspnea is improved, and her blood pressure readings that she brings in  today are remarkably better.  Her average pressure is now running about  125-130 systolic.  Diastolics are in the 60s and 70s.  Her heart rate  again in the mornings is running in the high 40s but during the today is  in the 60s.   She is on amlodipine 2.5 mg a day, omeprazole 40 mg a day, iron,  furosemide 20-40 mg every other day, and aspirin 81 mg.   PHYSICAL EXAMINATION:  GENERAL:  She is in no acute distress.  VITAL SIGNS:  Respirations 18 and unlabored.  NECK:  No JVD.  Carotid upstrokes are equal bilateral bruits.  Thyroid  is not enlarged.  Trachea is midline.  CHEST:  Lungs are clear to auscultation and percussion.  Heart reveals a  normal S1 and S2.  No gallop.  ABDOMEN:  Soft, good bowel sounds.  EXTREMITIES:  Pitting 1+ edema on the left lower extremity, trace on the  right.  Pulses are present bilaterally symmetrical.  NEUROLOGIC:  Intact.  Gait is slow but steady.   ASSESSMENT AND PLAN:  Ms. Joo is doing remarkably better both in  terms of her blood pressure as well as her symptoms of dyspnea.  We will  plan on seeing her back again in 6 months.   She would like to stop her omeprazole.  We had prescribed this back in  the past for what we thought were reflux symptoms.  I have told her to  take Tums p.r.n.  which she prefers, but if she starts having more  symptoms of chest pain she will go back on the omeprazole.     Thomas C. Daleen Squibb, MD, [facility]  Electronically Signed    TCW/MedQ  DD: 12/29/2008  DT: 12/30/2008  Job #: [id number]   cc:   Ramon Dredge L. Juanetta Gosling, M.D.

## 2010-12-28 NOTE — Assessment & Plan Note (Signed)
[facility]                       [place] CARDIOLOGY OFFICE NOTE   Glass, Susan Glass                       MRN:          [id number]  DATE:11/28/2008                            DOB:          03/13/1917    Susan Glass comes back today after undergoing cardiac catheterization for  significant dyspnea on exertion with reduced exercise tolerance and a  drop in her blood pressure with exercise.  She also has ventricular  bigeminy which seems to be permanent.  She had normal 2-D echocardiogram  prior to that.   She did not tolerate metoprolol and it did not have any effect on her  PVCs as well.   Her catheterization showed minimal coronary artery disease with elevated  left ventricular end-diastolic pressure as well as some mild-to-moderate  pulmonary hypertension.   Since going home on furosemide 40 mg a day, she feels remarkably better.  Her shortness of breath is improved.   Unfortunately, her blood pressure has been running about 170-180  systolic and over 100 diastolic when she first gets up in the morning.  This is not with activity per se.  When she sits down and rest, it comes  back down.   She denies any PND, orthopnea, or peripheral edema.  She has had no  syncope.   PHYSICAL EXAMINATION:  VITAL SIGNS:  Her blood pressure today is 136/70,  pulse is 60 and she is in ventricular bigeminy once again.  Her weight  is 128 and stable.  HEENT:  Unchanged, unremarkable.  NECK:  There is no JVD.  Thyroid is not enlarged.  LUNGS:  Clear to auscultation and percussion.  HEART:  An irregular rate and rhythm consistent with ventricular  bigeminy.  ABDOMEN:  Soft.  EXTREMITIES:  No edema.  Pulses are present.  NEUROLOGIC:  Grossly intact.   EKG again shows the ventricular bigeminy.   ASSESSMENT:  1. Dyspnea on exertion, which is now improved by lowering her wedge      pressure with a diuretic.  2. Hypertension, which appears to be at rest.  3. Relatively asymptomatic ventricular bigeminy and held by beta-      blockade.   PLAN:  1. Check BMET for potential potassium replacement.  2. Begin amlodipine 2.5 mg p.o. q.a.m.  I have told her son that if      her blood pressure continues to run 160-170 range, we need to be      notified.  At that point, we would increase      her amlodipine to 5 mg a day.  I do want to be too aggressive with      her considering her age.  3. Follow with me in 4-6 weeks.     Thomas C. Daleen Squibb, MD, [facility]  Electronically Signed    TCW/MedQ  DD: 11/28/2008  DT: 11/29/2008  Job #: [id number]   cc:   Ramon Dredge L. Juanetta Gosling, M.D.

## 2010-12-28 NOTE — Cardiovascular Report (Signed)
NAME:  Susan Glass, Susan Glass                ACCOUNT NO.:  [id number]   MEDICAL RECORD NO.:  [id number]          PATIENT TYPE:  INP   LOCATION:  2002                         FACILITY:  [facility]   PHYSICIAN:  Verne Carrow, MDDATE OF BIRTH:  1916-10-14   DATE OF PROCEDURE:  DATE OF DISCHARGE:                            CARDIAC CATHETERIZATION   PRIMARY CARDIOLOGIST:  Maisie Fus C. Wall, MD, [facility]   PROCEDURE PERFORMED:  1. Left heart catheterization.  2. Selective coronary angiography.  3. Right heart catheterization  4. Left ventricular angiogram.   OPERATOR:  Verne Carrow, MD   INDICATIONS:  Chest pain and dyspnea on exertion in a 77 year old female  who has no prior cardiac history and no prior cardiac workup.  The  patient exercised on a treadmill earlier this morning in our regional  office with Dr. Valera Castle supervising and developed shortness of  breath and chest pain on the treadmill with a slight drop in her  systolic blood pressure and a rapid increase in her heart rate with 2  minutes of exercise.  There was some concern for multivessel coronary  artery disease, and the patient was transferred to [facility]  for diagnostic left and right heart catheterization today.   DETAILS OF PROCEDURE:  The patient was brought to the inpatient cardiac  catheterization laboratory after signing informed consent for the  procedure.  The right groin was prepped and draped in sterile fashion.  1% lidocaine was used for local anesthesia.  Initially, a 5-French  sheath was inserted into the right femoral artery without difficulty.  Selective coronary angiography was performed with standard diagnostic  catheters.  A left ventricular angiogram was performed with a 5-French  pigtail catheter.  Following the left heart catheterization, I felt that  it was important to perform a right heart catheterization to assess her  pulmonary artery pressures and her filling pressures.  A right  heart  catheterization was performed with a multipurpose catheter through the  right femoral vein.  A 6-French sheath was inserted into the right  femoral vein for access.  The patient tolerated the procedure well and  was taken to the holding area in stable condition.   HEMODYNAMIC FINDINGS:  Central aortic pressure 171/76, left ventricular  pressure 157/9, left ventricular end-diastolic pressure 12, right atrial  pressure 8, right ventricular pressure 51/4, right ventricular end-  diastolic pressure 10, pulmonary artery pressure 50/16 with a mean of  26, pulmonary capillary wedge pressure mean of 18-20, central aortic  saturation 91%, pulmonary artery saturation 63%.  Cardiac output 4.32  liters/per minute (Fick).  Cardiac index 2.75 liters/minute/meter  squared.   ANGIOGRAPHIC FINDINGS:  1. The left main coronary artery had no evidence of obstructive      disease.  2. The left anterior descending has a large vessel that courses to the      apex and has a smooth 30% plaque in the proximal portion and a      smooth 30% plaque in the midportion.  This gives off two small-to-      moderate sized diagonal branches that are free  of disease.  There      is no obstructive disease in the LAD system.  3. Circumflex artery has a large caliber vessel that has a mild 10-20%      plaque in the proximal portion.  The first obtuse marginal is very      small.  The second obtuse marginal is a moderate-sized vessel that      is tortuous but has no severe disease.  There is a large      bifurcating posterolateral branch that has no evidence of disease.      The AV groove circumflex has no evidence of disease.  4. The right coronary artery has a moderate size codominant vessel      that has no evidence of obstructive disease.  5. Left ventricular angiogram was performed in the RAO projection and      shows mild global left ventricular dysfunction with ejection      fraction of 40-45%.  There is mild  mitral regurgitation noted.  Of      note, there were multiple premature ventricular contractions that      occurred during the injection into the left ventricle.  This could      have altered the termination of overall left ventricular systolic      function slightly.   IMPRESSION:  1. Mild nonobstructive coronary artery disease.  2. Mild global left ventricular systolic dysfunction.  3. Mild-to-moderate elevation in pulmonary artery pressure.  4. Elevated wedge pressure.   RECOMMENDATIONS:  I will transfer the patient to the telemetry unit  following the catheterization.  We will begin mild diuresis and aim for  better blood pressure control.  I would also like to perform a surface  echocardiogram prior to discharge to get another assessment of her  overall left ventricular systolic function.  This would also be helpful  to look for abnormal relaxation and possible diastolic dysfunction as an  etiology of her chest discomfort.  I also would like to have another  assessment of her mitral regurgitation.      Verne Carrow, MD  Electronically Signed     CM/MEDQ  D:  11/11/2008  T:  11/12/2008  Job:  [id number]   cc:   Thomas C. Wall, MD, [facility]

## 2010-12-28 NOTE — Assessment & Plan Note (Signed)
[facility]                       [place] CARDIOLOGY OFFICE NOTE   NAME:Susan Glass                       MRN:          [id number]  DATE:11/20/2008                            DOB:          [birth year]    Susan Glass returns today after having a cardiac catheterization for  severe dyspnea, exercise limitation on a treadmill, as well as chest  discomfort.   Remarkably, she had minimal coronary artery disease with an ejection  fraction of 45%.  Specifically, she had 30% in the proximal and mid LAD  that was smooth.   She had an mild-to-moderate elevation of pulmonary artery pressure and  she had a wedge pressure that was elevated.   Dr. Clifton James and I spoke on the phone.  We placed on furosemide 40 mg a  day as well as metoprolol 25 mg p.o. b.i.d.   Unfortunately, she has continued to have problems with this viral URI.  She saw Dr. Juanetta Gosling the other day.  She is still significantly short of  breath, may be a little bit better.  She denies any palpitations or  chest pain.   MEDICINES:  1. Furosemide 40 mg a day.  2. Metoprolol 25 b.i.d.  3. Aspirin 81 mg a day.   PHYSICAL EXAMINATION:  VITAL SIGNS:  Her weight today is 128, down on 6  pounds since I saw her initially.  Her blood pressure is still high at  156/64, her heart rate was 79 and she is in ventricular bigeminy.  Her  effective heart rate is only about 41 in the periphery.  HEENT:  Her eyes were swollen and red.  Facial symmetry is normal.  The  rest of HEENT is unchanged.  NECK:  Supple.  Carotid upstrokes were equal bilaterally without bruits.  Thyroid is not enlarged.  Trachea is midline.  There is no JVD.  LUNGS:  Clear.  She has some upper respiratory sounds, but no wheezing  or rhonchi.  HEART:  A nondisplaced PMI.  She has a ventricular bigeminal rhythm with  no gallop.  ABDOMEN:  Soft, good bowel sounds.  EXTREMITIES:  No edema.  Pulses are present.  NEURO:  Intact.   Susan Glass is still having significant dyspnea, which much may be due to  her viral URI.  We have been able to get some fluid.  Also, I suspect a  wedge pressure has dropped.  The metoprolol has done no good with her  ventricular bigeminy and in fact she has been having diarrhea.   PLAN:  1. Continue aspirin and furosemide.  2. Potassium rich foods encouraged.  3. See me back in a couple of weeks to see if her dyspnea has improved      when she is over this viral URI.  If not, we may try a calcium      channel blocker to suppress her ventricular ectopy.  We will check      a BMET at that time as well.     Thomas C. Daleen Squibb, MD, [facility]  Electronically Signed    TCW/MedQ  DD: 11/20/2008  DT: 11/21/2008  Job #: [id number]   cc:   Oneal Deputy. Juanetta Gosling, M.D.

## 2010-12-28 NOTE — Op Note (Signed)
NAME:  Susan Glass, Susan Glass                ACCOUNT NO.:  [id number]   MEDICAL RECORD NO.:  [id number]          PATIENT TYPE:  AMB   LOCATION:  DAY                           FACILITY:  [facility]   PHYSICIAN:  R. Roetta Sessions, M.D. DATE OF BIRTH:  1916-12-30   DATE OF PROCEDURE:  01/24/2007  DATE OF DISCHARGE:                               OPERATIVE REPORT   PROCEDURE:  Colonoscopy with ileoscopy and biopsy.   INDICATIONS FOR PROCEDURE:  An 77 year old lady noted to have recent  iron deficiency anemia and some diarrhea and abdominal cramps which have  resolved since she stopped taking meloxicam.  She has never had a  colonoscopy.  There is no family history of colorectal neoplasia.  Colonoscopy is now being done.  This approach has been discussed with  the patient at length.  Potential risks, benefits and alternatives have  been reviewed.  Please see documentation in the medical record.   PROCEDURE NOTE:  O2 saturation, blood pressure, pulse and respirations  were monitored throughout the entire procedure.   CONSCIOUS SEDATION:  Versed 4 mg IV and Demerol 50 mg IV in divided  doses.   INSTRUMENT:  Pentax video chip adult and pediatric colonoscope.   FINDINGS:  Digital rectal exam revealed external hemorrhoids.   ENDOSCOPIC FINDINGS:  The prep was good.  Examination of colonic mucosa  was undertaken from the rectosigmoid junction through the left  transverse  and right colon to area of the appendiceal orifice,  ileocecal valve and cecum.  These structures were seen and photographed  for the record.  Terminal ileum was intubated to 5 cm.  From this level,  the scope was slowly withdrawn.  All previously mentioned mucosal  surfaces were again seen.  I initially had difficulty keeping the lumen  in view advancing the adult scope.  I was unable to get past the left  colon with the adult scope and pulled out and went with the pediatric  scope, was able to make it around.  The patient was noted  have a 3 mm  erosion in the ileocecal valve which was actually oozing a small amount  of blood.  Please see photos.  The patient had left-sided diverticula.  The terminal ileum appeared normal.  There was the 4 mm polyp adjacent  to a tick in the mid sigmoid colon which was cold biopsy/removed.  The  erosion at the ileocecal valve was also biopsied.  Remainder of colon  mucosa appeared normal.  Scope was pulled down the rectum where thorough  examination of the rectal mucosa including retroflexed view of the anal  verge demonstrated no abnormalities.  The patient tolerated the  procedure well as reactive to endoscopy.   IMPRESSION:  1. External hemorrhoids, otherwise normal rectum.  2. Left-sided diverticula.  3. Diminutive polyp mid sigmoid cold biopsy/removed.  4. Oozing erosion at ileocecal valve biopsied.  5. Otherwise colonic mucosa appeared normal, normal terminal ileum.   I suspect the patient has suffered nonsteroidal anti-inflammatory drug  insult to her GI tract and the erosion at the ileocecal valve may be the  residual  of that insult.   RECOMMENDATIONS:  1. Will see where we stand with a CBC today.  Diverticulosis      literature provided to Ms. Montfort.  Follow-up on path.  2. Recommended daily fiber supplement.  3. Further recommendations to follow.      Jonathon Bellows, M.D.  Electronically Signed     RMR/MEDQ  D:  01/24/2007  T:  01/24/2007  Job:  [id number]   cc:   Ramon Dredge L. Juanetta Gosling, M.D.  Fax: [phone]

## 2010-12-28 NOTE — Discharge Summary (Signed)
NAME:  Susan Glass, Susan Glass                ACCOUNT NO.:  [id number]   MEDICAL RECORD NO.:  [id number]          PATIENT TYPE:  INP   LOCATION:  2002                         FACILITY:  [facility]   PHYSICIAN:  Verne Carrow, MDDATE OF BIRTH:  1917-02-20   DATE OF ADMISSION:  11/11/2008  DATE OF DISCHARGE:  11/12/2008                               DISCHARGE SUMMARY   PRIMARY CARDIOLOGIST:  Maisie Fus C. Wall, MD, [facility], in [place] office.   PRIMARY CARE PHYSICIAN:  Edward L. Juanetta Gosling, MD   PROCEDURES PERFORMED DURING HOSPITALIZATION:  Cardiac catheterization  completed by Dr. Verne Carrow on November 11, 2008.  A.  Left main; no obstructive disease.  LAD; 30% proximal smooth plaque,  luminal irregularities mid LAD with 2 small diagonals.  Circ; mild  plaque proximal circumflex, OM1 small, OM2 moderate sized no disease.  LPDA; no disease.  Right coronary artery; moderate size, no obstructive  disease.  LVEF revealing global LV dysfunction with an EF of 40-45% with  mild MR, multiple PVCs during injection.   FINAL DISCHARGE DIAGNOSES:  1. 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  2. Nonobstructive coronary artery disease.      a.     Status post cardiac catheterization on November 11, 2008.  3. Hypercholesterolemia.  4. Frequent premature ventricular contractions.  5. Mild pulmonary hypertension.   HOSPITAL COURSE:  This is a very pleasant 77 year old Caucasian female  who was seen originally by Dr. Valera Castle on November 10, 2008, after  being referred there by Dr. Juanetta Gosling.  The patient had been complaining  of dyspnea on exertion.  The patient did have pulmonary function studies  and echocardiogram which were fairly normal.  She did have mild  pulmonary hypertension with a peak of 42 mmHg.  The patient had  complaints of chest pressure and uncomfortable feeling in the center of  her chest and lasting approximately about 30 minutes and slowly went  away.  The patient was also  noted to be in ventricular bigeminy.  A  stress test was planned for the patient at the request of Dr. Daleen Squibb to  evaluate the patient's heart rate response to exercise and to see if her  ventricular ectopy worsens and goes away.   The patient did have a modified stress test in the office in [place]  on March 30 and while observing the EKG during the stress test, her  heart rate went up to 110 in less than 2 minutes and she remained in  ventricular bigeminy, but there was no ST changes.  Her blood pressure  did drop some from original of 158/66 to 142/68 and the patient denied  feeling any chest pain, dizziness, or shortness of breath.   Based upon the results of the stress test, the patient was transferred  to [facility] by care line for planned cardiac catheterization  that day.  The patient did undergo cardiac catheterization per Dr.  Verne Carrow as described above.  Please see Dr. Cindra Eves note for more details.  Following cardiac catheterization,  the patient had no further complaints.  She ambulated  in the hallway  with assistance and did not have any recurrence of her chest pain.  The  patient continued to have multiple PVCs but felt much better.  The  patient was given Lasix during hospitalization and torsemide was  discontinued.  Dr. Clifton James did speak with Dr. Valera Castle on day of  discharge to discuss medication changes and for followup.  The patient  will be started on Lopressor 25 mg twice a day and will monitor for  bradycardia.  We will not start ACE inhibitor secondary to hypotension  during recent catheterization.  The patient will follow up with Dr. Daleen Squibb  in the [place] office in 1 week and will have a BMET drawn prior to  seeing him to evaluate renal function.  The patient was anxious to  return home and verbalized understanding about followup appointments.   DISCHARGE LABORATORY DATA:  Sodium 137, potassium 4.2, chloride 105,  CO2  of 27, BUN 15, creatinine 0.87, and glucose 91.  Hemoglobin 13,  hematocrit 38, white blood cells 6.1, and platelets 135.  Echocardiogram  dated October 08, 2008, revealing an EF of 55% with trivial AI, mild  MR, and moderate LAE.   DISCHARGE VITAL SIGNS:  Blood pressure 121/65, pulse 66, respirations  18, temperature 98.1, and O2 sat 93% on room air.  The patient diuresed  greater than 770 mL during admission.   DISCHARGE MEDICATIONS:  1. Aspirin 81 mg daily.  2. Lopressor 25 mg twice a day.  3. Lasix 40 mg 1 pill once daily on an empty stomach.  4. Lipitor 20 mg daily.   ALLERGIES:  No known drug allergies.   FOLLOWUP PLANS AND APPOINTMENT:  1. The patient will follow in the  office on Monday for a      BMET to be drawn with results sent to Dr. Daleen Squibb in the [place]      office.  2. The patient will follow with Dr. Valera Castle on November 20, 2008, at      11:45 p.m. for discussion of the patient's symptoms and response to      treatment.  3. The patient is given post cardiac catheterization instructions with      particular emphasis on the right groin site for evidence of      bleeding, hematoma, or signs of infection.  4. The patient is to follow up with her primary care physician, Dr.      Juanetta Gosling, on her own accord.   Time spent with the patient to include physician time 35 minutes.      Bettey Mare. Lyman Bishop, NP      Verne Carrow, MD  Electronically Signed    KML/MEDQ  D:  11/12/2008  T:  11/13/2008  Job:  [id number]   cc:   Ramon Dredge L. Juanetta Gosling, M.D.

## 2010-12-31 NOTE — Procedures (Signed)
NAME:  Susan Glass, GRAGERT                ACCOUNT NO.:  [id number]   MEDICAL RECORD NO.:  [id number]          PATIENT TYPE:  OUT   LOCATION:  RESP                          FACILITY:  [facility]   PHYSICIAN:  Edward L. Juanetta Gosling, M.D.DATE OF BIRTH:  09/12/1916   DATE OF PROCEDURE:  DATE OF DISCHARGE:                            PULMONARY FUNCTION TEST   PULMONARY FUNCTION TEST:  1. Spirometry shows no ventilatory defect, but does show evidence of      airflow obstruction.  2. Lung volumes are normal.  3. DLCO is normal.  4. Arterial blood gases are normal.      Edward L. Juanetta Gosling, M.D.  Electronically Signed     ELH/MEDQ  D:  10/11/2008  T:  10/11/2008  Job:  [id number]

## 2011-05-17 LAB — BASIC METABOLIC PANEL
BUN: 29 — ABNORMAL HIGH
Calcium: 9.5
Chloride: 103
Creatinine, Ser: 1.09

## 2011-05-17 LAB — HEMOGLOBIN AND HEMATOCRIT, BLOOD: Hemoglobin: 13.4

## 2011-06-02 LAB — CBC
HCT: 34.6 — ABNORMAL LOW
Hemoglobin: 11.8 — ABNORMAL LOW
MCHC: 34.2
Platelets: 169
RDW: 14.9 — ABNORMAL HIGH

## 2011-07-22 ENCOUNTER — Other Ambulatory Visit: Payer: Self-pay | Admitting: Cardiology

## 2011-09-15 ENCOUNTER — Other Ambulatory Visit: Payer: Self-pay | Admitting: Cardiology

## 2011-10-31 ENCOUNTER — Other Ambulatory Visit: Payer: Self-pay | Admitting: Cardiology

## 2011-10-31 MED ORDER — AMLODIPINE BESYLATE 2.5 MG PO TABS: 2.5000 mg | ORAL_TABLET | Freq: Every day | ORAL | Status: DC

## 2011-11-01 ENCOUNTER — Ambulatory Visit: Payer: Medicare Other | Admitting: Adult Health

## 2011-11-08 ENCOUNTER — Ambulatory Visit (INDEPENDENT_AMBULATORY_CARE_PROVIDER_SITE_OTHER): Payer: Medicare Other | Admitting: Adult Health

## 2011-11-08 ENCOUNTER — Encounter: Payer: Self-pay | Admitting: Adult Health

## 2011-11-08 ENCOUNTER — Other Ambulatory Visit: Payer: Self-pay | Admitting: Adult Health

## 2011-11-08 VITALS — BP 154/75 | HR 42 | Ht 60.0 in | Wt 126.0 lb

## 2011-11-08 DIAGNOSIS — I5042 Chronic combined systolic (congestive) and diastolic (congestive) heart failure: Secondary | ICD-10-CM

## 2011-11-08 DIAGNOSIS — I2510 Atherosclerotic heart disease of native coronary artery without angina pectoris: Secondary | ICD-10-CM

## 2011-11-08 DIAGNOSIS — I10 Essential (primary) hypertension: Secondary | ICD-10-CM

## 2011-11-08 DIAGNOSIS — E7800 Pure hypercholesterolemia, unspecified: Secondary | ICD-10-CM

## 2011-11-08 NOTE — Assessment & Plan Note (Signed)
Mildly elevated on this visit, but lower on recheck in the exam room of 140/72. She will continue medications without changes. BMET and CBC will be ordered. She will have them drawn today with results to Dr. Juanetta Gosling.

## 2011-11-08 NOTE — Assessment & Plan Note (Signed)
No cardiac complaints. She is very sedentary and does not exert herself very much. She is stable at this time. Refills on all medications are provided.

## 2011-11-08 NOTE — Assessment & Plan Note (Signed)
She is followed by Dr. Juanetta Gosling for this.  Not on any statin's at this time.

## 2011-11-08 NOTE — Patient Instructions (Signed)
Your physician recommends that you schedule a follow-up appointment in: 1 year  Your physician recommends that you return for lab work in: Today   

## 2011-11-08 NOTE — Progress Notes (Signed)
   HPI: [name] is a pleasant 78 y/o patient of Dr. Juanito Doom we are following for ongoing assessment for diastolic CHF, CAD, hypertension, with history of debilitating arthritis. She has not been seen for one year. She is also followed by Dr. Juanetta Gosling, PCP, but has not seen him for 2 years. She has had no hospitalizations or ER visits. She is quite sedentary with arthritis and is basically homebound. She has had no cardiac complaints   No Known Allergies  Current Outpatient Prescriptions  Medication Sig Dispense Refill  . amLODipine (NORVASC) 2.5 MG tablet Take 1 tablet (2.5 mg total) by mouth daily.  30 tablet  1  . aspirin 81 MG tablet Take 81 mg by mouth daily.        . ferrous sulfate 325 (65 FE) MG tablet Take 325 mg by mouth daily with breakfast.        . furosemide (LASIX) 40 MG tablet TAKE ONE TABLET BY MOUTH EVERY DAY  30 tablet  6  . naproxen sodium (ALEVE) 220 MG tablet Take 220 mg by mouth as needed.        . Omega-3 Fatty Acids (FISH OIL) 1000 MG CAPS Take 1 capsule by mouth daily.          Past Medical History  Diagnosis Date  . Hypertension   . Hyperlipidemia     hypercholesterolemia  . CAD (coronary artery disease)   . CHF (congestive heart failure)     Past Surgical History  Procedure Date  . Cholecystectomy   . Appendectomy     HQI:ONGEXB of systems complete and found to be negative unless listed above PHYSICAL EXAM BP 154/75  Pulse 42  Ht 5' (1.524 m)  Wt 126 lb (57.153 kg)  BMI 24.61 kg/m2 \\General : Well developed, well nourished, in no acute distress Head: Eyes PERRLA, No xanthomas.   Normal cephalic and atramatic  Lungs: Clear bilaterally to auscultation and percussion. Heart: HRIR , soft 1/6 systolic murmur.  Pulses are 2+ & equal.            No carotid bruit. No JVD.  No abdominal bruits. No femoral bruits. Abdomen: Bowel sounds are positive, abdomen soft and non-tender without masses or                  Hernia's noted. Msk:  Back kyphosis is  noted, slow gait.dimished strength and tone for age.Uses cane for ambulation Extremities: No clubbing, cyanosis Mild nonpitting edema of the knees and ankles..  DP +1 Neuro: Alert and oriented X 3. Psych:  Good affect, responds appropriately    ASSESSMENT AND PLAN

## 2011-11-09 LAB — CBC WITH DIFFERENTIAL/PLATELET
Basophils Relative: 0 % (ref 0–1)
Eosinophils Absolute: 0.1 10*3/uL (ref 0.0–0.7)
MCH: 30 pg (ref 26.0–34.0)
MCHC: 33.5 g/dL (ref 30.0–36.0)
Neutrophils Relative %: 58 % (ref 43–77)
Platelets: 178 10*3/uL (ref 150–400)
RBC: 4.5 MIL/uL (ref 3.87–5.11)

## 2011-11-23 DIAGNOSIS — M1990 Unspecified osteoarthritis, unspecified site: Secondary | ICD-10-CM | POA: Diagnosis not present

## 2011-11-23 DIAGNOSIS — I2510 Atherosclerotic heart disease of native coronary artery without angina pectoris: Secondary | ICD-10-CM | POA: Diagnosis not present

## 2011-11-23 DIAGNOSIS — G4700 Insomnia, unspecified: Secondary | ICD-10-CM | POA: Diagnosis not present

## 2011-11-23 DIAGNOSIS — I10 Essential (primary) hypertension: Secondary | ICD-10-CM | POA: Diagnosis not present

## 2012-01-31 ENCOUNTER — Other Ambulatory Visit: Payer: Self-pay | Admitting: Cardiology

## 2012-02-01 ENCOUNTER — Other Ambulatory Visit: Payer: Self-pay | Admitting: *Deleted

## 2012-02-01 MED ORDER — AMLODIPINE BESYLATE 2.5 MG PO TABS: 2.5000 mg | ORAL_TABLET | Freq: Every day | ORAL | Status: DC

## 2012-02-03 MED ORDER — AMLODIPINE BESYLATE 2.5 MG PO TABS: 2.5000 mg | ORAL_TABLET | Freq: Every day | ORAL | Status: DC

## 2012-02-03 NOTE — Addendum Note (Signed)
Addended by: Marrion Coy L on: 02/03/2012 11:21 AM   Modules accepted: Orders

## 2012-04-05 DIAGNOSIS — L570 Actinic keratosis: Secondary | ICD-10-CM | POA: Diagnosis not present

## 2012-04-05 DIAGNOSIS — L219 Seborrheic dermatitis, unspecified: Secondary | ICD-10-CM | POA: Diagnosis not present

## 2012-06-01 ENCOUNTER — Other Ambulatory Visit: Payer: Self-pay | Admitting: Adult Health

## 2012-06-13 DIAGNOSIS — Z23 Encounter for immunization: Secondary | ICD-10-CM | POA: Diagnosis not present

## 2012-06-26 DIAGNOSIS — E785 Hyperlipidemia, unspecified: Secondary | ICD-10-CM | POA: Diagnosis not present

## 2012-06-26 DIAGNOSIS — F411 Generalized anxiety disorder: Secondary | ICD-10-CM | POA: Diagnosis not present

## 2012-06-26 DIAGNOSIS — M1990 Unspecified osteoarthritis, unspecified site: Secondary | ICD-10-CM | POA: Diagnosis not present

## 2012-06-26 DIAGNOSIS — I10 Essential (primary) hypertension: Secondary | ICD-10-CM | POA: Diagnosis not present

## 2012-07-03 DIAGNOSIS — I259 Chronic ischemic heart disease, unspecified: Secondary | ICD-10-CM | POA: Diagnosis not present

## 2012-07-03 DIAGNOSIS — I10 Essential (primary) hypertension: Secondary | ICD-10-CM | POA: Diagnosis not present

## 2012-07-03 DIAGNOSIS — E785 Hyperlipidemia, unspecified: Secondary | ICD-10-CM | POA: Diagnosis not present

## 2012-07-03 DIAGNOSIS — M1990 Unspecified osteoarthritis, unspecified site: Secondary | ICD-10-CM | POA: Diagnosis not present

## 2012-10-29 ENCOUNTER — Ambulatory Visit: Payer: Medicare Other | Admitting: Cardiology

## 2012-10-31 ENCOUNTER — Encounter: Payer: Self-pay | Admitting: Adult Health

## 2012-10-31 NOTE — Progress Notes (Signed)
   HPI: Susan Glass is a pleasant 79 y/o patient of Dr. Juanito Doom we are following for ongoing assessment for diastolic CHF, CAD, hypertension, with history of debilitating arthritis. She has not been seen for one year. She is also followed by Dr. Juanetta Gosling, PCP.     She has had no ER visits or hospitalizations since being seen last. Saw Dr. Juanetta Gosling last month with a clean bill of health. She has no complaints of chest pain or DOE. Arthritis in the knees concerns her sometimes.    No Known Allergies  Current Outpatient Prescriptions  Medication Sig Dispense Refill  . ALPRAZolam (XANAX) 0.5 MG tablet Take 0.5 mg by mouth 2 (two) times daily.      Marland Kitchen amLODipine (NORVASC) 2.5 MG tablet Take 1 tablet (2.5 mg total) by mouth daily.  30 tablet  6  . aspirin 81 MG tablet Take 81 mg by mouth daily.        . furosemide (LASIX) 40 MG tablet TAKE ONE TABLET BY MOUTH EVERY DAY  30 tablet  5  . naproxen sodium (ALEVE) 220 MG tablet Take 220 mg by mouth as needed.        . Omega-3 Fatty Acids (FISH OIL) 1000 MG CAPS Take 1 capsule by mouth daily.         No current facility-administered medications for this visit.    Past Medical History  Diagnosis Date  . Hypertension   . Hyperlipidemia     hypercholesterolemia  . CAD (coronary artery disease)   . CHF (congestive heart failure)     Past Surgical History  Procedure Laterality Date  . Cholecystectomy    . Appendectomy      AVW:UJWJXB of systems complete and found to be negative unless listed above  PHYSICAL EXAM BP 131/76  Pulse 73  Ht 5' (1.524 m)  Wt 124 lb 12 oz (56.586 kg)  BMI 24.36 kg/m2  General: Well developed, well nourished, in no acute distress Head: Eyes PERRLA, No xanthomas.   Normal cephalic and atramatic  Lungs: Clear bilaterally to auscultation and percussion. Heart: HRRR S1 S2, without MRG.  Pulses are 2+ & equal.            No carotid bruit. No JVD.  No abdominal bruits. No femoral bruits. Abdomen: Bowel sounds are  positive, abdomen soft and non-tender without masses or                  Hernia's noted. Msk:  Back normal, normal gait. Normal strength and tone for age. Extremities: No clubbing, cyanosis or edema.  DP +1 Neuro: Alert and oriented X 3. Psych:  Good affect, responds appropriately  EKG: Normal sinus rhythm with decreasing R wave progression. Nonspecific T-wave abnormality in the inferior leads. Rate of 75 beats per minute  ASSESSMENT AND PLAN

## 2012-11-01 ENCOUNTER — Encounter: Payer: Self-pay | Admitting: Adult Health

## 2012-11-01 ENCOUNTER — Ambulatory Visit (INDEPENDENT_AMBULATORY_CARE_PROVIDER_SITE_OTHER): Payer: Medicare Other | Admitting: Adult Health

## 2012-11-01 VITALS — BP 131/76 | HR 73 | Ht 60.0 in | Wt 124.8 lb

## 2012-11-01 DIAGNOSIS — I2510 Atherosclerotic heart disease of native coronary artery without angina pectoris: Secondary | ICD-10-CM

## 2012-11-01 DIAGNOSIS — I10 Essential (primary) hypertension: Secondary | ICD-10-CM

## 2012-11-01 DIAGNOSIS — E7800 Pure hypercholesterolemia, unspecified: Secondary | ICD-10-CM

## 2012-11-01 NOTE — Patient Instructions (Addendum)
Your physician recommends that you schedule a follow-up appointment in: 1 year  

## 2012-11-01 NOTE — Assessment & Plan Note (Signed)
She is without cardiac complaints. She is now been hospitalized or seen in the ER for cardiac issue. In fact she has not been seen in the hospital or ER at all in the last year. She remains as active as she can with her debilitating arthritis in both her knees and back. She remains in good spirits has good family support. She is recently seen by Dr. Juanetta Gosling who has completed a labs within the last month. We will make no changes on her medications. Dr. Juanetta Gosling was provided refills on all medication she is taking. We will continue risk management in this very nice elderly woman.

## 2012-11-01 NOTE — Assessment & Plan Note (Signed)
Currently controlled. She will continue on medication regimen as directed. She is not on a statin but is taking fish oil tablets.

## 2012-11-01 NOTE — Progress Notes (Deleted)
Name: Susan Glass    DOB: Mar 10, 1917  Age: 79 y.o.  MR#: [id number]       PCP:  Fredirick Maudlin, MD      Insurance: Payor: MEDICARE  Plan: MEDICARE PART B  Product Type: *No Product type*    CC:    Chief Complaint  Patient presents with  . Coronary Artery Disease  . Hypertension    VS Filed Vitals:   11/01/12 1356  BP: 131/76  Pulse: 73  Height: 5' (1.524 m)  Weight: 124 lb 12 oz (56.586 kg)    Weights Current Weight  11/01/12 124 lb 12 oz (56.586 kg)  11/08/11 126 lb (57.153 kg)  11/17/10 133 lb (60.328 kg)    Blood Pressure  BP Readings from Last 3 Encounters:  11/01/12 131/76  11/08/11 154/75  11/17/10 137/72     Admit date:  (Not on file) Last encounter with RMR:  06/01/2012   Allergy Review of patient's allergies indicates no known allergies.  Current Outpatient Prescriptions  Medication Sig Dispense Refill  . ALPRAZolam (XANAX) 0.5 MG tablet Take 0.5 mg by mouth 2 (two) times daily.      Marland Kitchen amLODipine (NORVASC) 2.5 MG tablet Take 1 tablet (2.5 mg total) by mouth daily.  30 tablet  6  . aspirin 81 MG tablet Take 81 mg by mouth daily.        . furosemide (LASIX) 40 MG tablet TAKE ONE TABLET BY MOUTH EVERY DAY  30 tablet  5  . naproxen sodium (ALEVE) 220 MG tablet Take 220 mg by mouth as needed.        . Omega-3 Fatty Acids (FISH OIL) 1000 MG CAPS Take 1 capsule by mouth daily.         No current facility-administered medications for this visit.    Discontinued Meds:    Medications Discontinued During This Encounter  Medication Reason  . amLODipine (NORVASC) 2.5 MG tablet Error  . ferrous sulfate 325 (65 FE) MG tablet Error    Patient Active Problem List  Diagnosis  . HYPERCHOLESTEROLEMIA  . HYPERTENSION  . CAD, NATIVE VESSEL  . CHF  . COMBINED HEART FAILURE, CHRONIC  . Edema extremities    LABS    Component Value Date/Time   NA 141 11/17/2010 1407   NA 139 10/19/2009 2304   NA 142 02/24/2009 1353   K 4.4 11/17/2010 1407   K 4.0 10/19/2009 2304    K 4.2 02/24/2009 1353   CL 105 11/17/2010 1407   CL 107 10/19/2009 2304   CL 109 02/24/2009 1353   CO2 24 11/17/2010 1407   CO2 27 10/19/2009 2304   CO2 28 02/24/2009 1353   GLUCOSE 93 11/17/2010 1407   GLUCOSE 103* 10/19/2009 2304   GLUCOSE 120* 02/24/2009 1353   BUN 23 11/17/2010 1407   BUN 19 10/19/2009 2304   BUN 22 02/24/2009 1353   CREATININE 0.87 11/17/2010 1407   CREATININE 0.84 10/19/2009 2304   CREATININE 0.85 02/24/2009 1353   CREATININE 0.85 12/22/2008   CALCIUM 8.9 11/17/2010 1407   CALCIUM 8.8 10/19/2009 2304   CALCIUM 9.2 02/24/2009 1353   GFRNONAA >60 10/19/2009 2304   GFRNONAA >60 02/24/2009 1353   GFRNONAA >60 11/12/2008 0415   GFRAA  Value: >60        The eGFR has been calculated using the MDRD equation. This calculation has not been validated in all clinical situations. eGFR's persistently <60 mL/min signify possible Chronic Kidney Disease. 10/19/2009 2304   GFRAA  Value: >60        The eGFR has been calculated using the MDRD equation. This calculation has not been validated in all clinical situations. eGFR's persistently <60 mL/min signify possible Chronic Kidney Disease. 02/24/2009 1353   GFRAA  Value: >60        The eGFR has been calculated using the MDRD equation. This calculation has not been validated in all clinical situations. eGFR's persistently <60 mL/min signify possible Chronic Kidney Disease. 11/12/2008 0415   CMP     Component Value Date/Time   NA 141 11/17/2010 1407   K 4.4 11/17/2010 1407   CL 105 11/17/2010 1407   CO2 24 11/17/2010 1407   GLUCOSE 93 11/17/2010 1407   BUN 23 11/17/2010 1407   CREATININE 0.87 11/17/2010 1407   CREATININE 0.84 10/19/2009 2304   CALCIUM 8.9 11/17/2010 1407   PROT 5.6* 11/11/2008 1304   ALBUMIN 3.3* 11/11/2008 1304   AST 23 11/11/2008 1304   ALT 16 11/11/2008 1304   ALKPHOS 58 11/11/2008 1304   BILITOT 0.5 11/11/2008 1304   GFRNONAA >60 10/19/2009 2304   GFRAA  Value: >60        The eGFR has been calculated using the MDRD equation. This calculation has not been validated  in all clinical situations. eGFR's persistently <60 mL/min signify possible Chronic Kidney Disease. 10/19/2009 2304       Component Value Date/Time   WBC 7.1 11/08/2011 0000   WBC 6.5 10/19/2009 2304   WBC 6.1 11/12/2008 0415   HGB 13.5 11/08/2011 0000   HGB 12.8 10/19/2009 2304   HGB 13.0 02/24/2009 1353   HCT 40.3 11/08/2011 0000   HCT 36.8 10/19/2009 2304   HCT 37.6 02/24/2009 1353   MCV 89.6 11/08/2011 0000   MCV 93.7 10/19/2009 2304   MCV 93.1 11/12/2008 0415    Lipid Panel     Component Value Date/Time   CHOL  Value: 129        ATP III CLASSIFICATION:  <200     mg/dL   Desirable  960-454  mg/dL   Borderline High  >=098    mg/dL   High        09/02/1476 0400   TRIG 68 11/12/2008 0400   HDL 39* 11/12/2008 0400   CHOLHDL 3.3 11/12/2008 0400   VLDL 14 11/12/2008 0400   LDLCALC  Value: 76        Total Cholesterol/HDL:CHD Risk Coronary Heart Disease Risk Table                     Men   Women  1/2 Average Risk   3.4   3.3  Average Risk       5.0   4.4  2 X Average Risk   9.6   7.1  3 X Average Risk  23.4   11.0        Use the calculated Patient Ratio above and the CHD Risk Table to determine the patient's CHD Risk.        ATP III CLASSIFICATION (LDL):  <100     mg/dL   Optimal  295-621  mg/dL   Near or Above                    Optimal  130-159  mg/dL   Borderline  308-657  mg/dL   High  >846     mg/dL   Very High 9/62/9528 4132    ABG    Component Value  Date/Time   PHART 7.383 11/11/2008 1608   PCO2ART 36.6 11/11/2008 1608   PO2ART 62.0* 11/11/2008 1608   HCO3 21.8 11/11/2008 1608   TCO2 23 11/11/2008 1608   ACIDBASEDEF 3.0* 11/11/2008 1608   O2SAT 91.0 11/11/2008 1608     Lab Results  Component Value Date   TSH 0.750 ***Test methodology is 3rd generation TSH**** 11/11/2008   BNP (last 3 results) No results found for this basename: PROBNP,  in the last 8760 hours Cardiac Panel (last 3 results) No results found for this basename: CKTOTAL, CKMB, TROPONINI, RELINDX,  in the last 72 hours   Iron/TIBC/Ferritin No results found for this basename: iron, tibc, ferritin     EKG Orders placed in visit on 11/01/12  . EKG 12-LEAD     Prior Assessment and Plan Problem List as of 11/01/2012     ICD-9-CM   HYPERCHOLESTEROLEMIA   Last Assessment & Plan   11/08/2011 Office Visit Written 11/08/2011  2:36 PM by Jodelle Gross, NP     She is followed by Dr. Juanetta Gosling for this.  Not on any statin's at this time.    HYPERTENSION   Last Assessment & Plan   11/08/2011 Office Visit Written 11/08/2011  2:36 PM by Jodelle Gross, NP     Mildly elevated on this visit, but lower on recheck in the exam room of 140/72. She will continue medications without changes. BMET and CBC will be ordered. She will have them drawn today with results to Dr. Juanetta Gosling.    CAD, NATIVE VESSEL   Last Assessment & Plan   11/08/2011 Office Visit Written 11/08/2011  2:35 PM by Jodelle Gross, NP     No cardiac complaints. She is very sedentary and does not exert herself very much. She is stable at this time. Refills on all medications are provided.    CHF   COMBINED HEART FAILURE, CHRONIC   Edema extremities       Imaging: No results found.

## 2012-11-01 NOTE — Assessment & Plan Note (Signed)
Good control of blood pressure on amlodipine. She also has Lasix daily. Patient will continue her medications as she is tolerating them well without evidence of lower extremity edema, or dehydration. A followup on annual basis.

## 2013-01-14 DIAGNOSIS — M1990 Unspecified osteoarthritis, unspecified site: Secondary | ICD-10-CM | POA: Diagnosis not present

## 2013-01-14 DIAGNOSIS — E785 Hyperlipidemia, unspecified: Secondary | ICD-10-CM | POA: Diagnosis not present

## 2013-01-14 DIAGNOSIS — J449 Chronic obstructive pulmonary disease, unspecified: Secondary | ICD-10-CM | POA: Diagnosis not present

## 2013-01-14 DIAGNOSIS — I509 Heart failure, unspecified: Secondary | ICD-10-CM | POA: Diagnosis not present

## 2013-02-05 ENCOUNTER — Other Ambulatory Visit: Payer: Self-pay | Admitting: Cardiology

## 2013-02-05 ENCOUNTER — Telehealth: Payer: Self-pay

## 2013-02-05 MED ORDER — FUROSEMIDE 40 MG PO TABS: ORAL_TABLET | ORAL | Status: DC

## 2013-02-05 MED ORDER — AMLODIPINE BESYLATE 2.5 MG PO TABS: ORAL_TABLET | ORAL | Status: DC

## 2013-02-05 NOTE — Telephone Encounter (Signed)
Medication sent via escribe for Norvasc and Lasix

## 2013-02-05 NOTE — Telephone Encounter (Signed)
furosemide (LASIX) 40 MG tablet RX number [id number]-amLODipine (NORVASC) 2.5 MG tablet  Patient needs refill of medication

## 2013-03-27 DIAGNOSIS — I10 Essential (primary) hypertension: Secondary | ICD-10-CM | POA: Diagnosis not present

## 2013-03-27 DIAGNOSIS — I69998 Other sequelae following unspecified cerebrovascular disease: Secondary | ICD-10-CM | POA: Diagnosis not present

## 2013-03-27 DIAGNOSIS — H532 Diplopia: Secondary | ICD-10-CM | POA: Diagnosis not present

## 2013-03-27 DIAGNOSIS — M1990 Unspecified osteoarthritis, unspecified site: Secondary | ICD-10-CM | POA: Diagnosis not present

## 2013-06-06 DIAGNOSIS — Z23 Encounter for immunization: Secondary | ICD-10-CM | POA: Diagnosis not present

## 2013-07-15 DIAGNOSIS — I10 Essential (primary) hypertension: Secondary | ICD-10-CM | POA: Diagnosis not present

## 2013-07-15 DIAGNOSIS — R42 Dizziness and giddiness: Secondary | ICD-10-CM | POA: Diagnosis not present

## 2013-07-15 DIAGNOSIS — I110 Hypertensive heart disease with heart failure: Secondary | ICD-10-CM | POA: Diagnosis not present

## 2013-07-15 DIAGNOSIS — M1990 Unspecified osteoarthritis, unspecified site: Secondary | ICD-10-CM | POA: Diagnosis not present

## 2013-07-17 DIAGNOSIS — L259 Unspecified contact dermatitis, unspecified cause: Secondary | ICD-10-CM | POA: Diagnosis not present

## 2013-07-17 DIAGNOSIS — D235 Other benign neoplasm of skin of trunk: Secondary | ICD-10-CM | POA: Diagnosis not present

## 2013-07-17 DIAGNOSIS — L821 Other seborrheic keratosis: Secondary | ICD-10-CM | POA: Diagnosis not present

## 2013-07-17 DIAGNOSIS — I781 Nevus, non-neoplastic: Secondary | ICD-10-CM | POA: Diagnosis not present

## 2013-08-21 ENCOUNTER — Other Ambulatory Visit: Payer: Self-pay | Admitting: Adult Health

## 2013-08-22 ENCOUNTER — Telehealth: Payer: Self-pay | Admitting: Adult Health

## 2013-08-22 MED ORDER — AMLODIPINE BESYLATE 2.5 MG PO TABS: ORAL_TABLET | ORAL | Status: DC

## 2013-08-22 NOTE — Telephone Encounter (Signed)
Received fax refill request  Rx # [id number] Medication:  Amlodipine Besylate 2.5 mg tab Qty 30 Sig:  Take one tablet by mouth once daily Physician:  Purcell Nails

## 2013-08-22 NOTE — Telephone Encounter (Signed)
rx to pharmacy as requested

## 2013-11-26 ENCOUNTER — Encounter: Payer: Self-pay | Admitting: Adult Health

## 2013-11-26 ENCOUNTER — Ambulatory Visit (INDEPENDENT_AMBULATORY_CARE_PROVIDER_SITE_OTHER): Payer: Medicare Other | Admitting: Adult Health

## 2013-11-26 VITALS — BP 166/69 | HR 86 | Ht 60.0 in | Wt 125.0 lb

## 2013-11-26 DIAGNOSIS — I2510 Atherosclerotic heart disease of native coronary artery without angina pectoris: Secondary | ICD-10-CM

## 2013-11-26 DIAGNOSIS — I5042 Chronic combined systolic (congestive) and diastolic (congestive) heart failure: Secondary | ICD-10-CM | POA: Diagnosis not present

## 2013-11-26 DIAGNOSIS — I10 Essential (primary) hypertension: Secondary | ICD-10-CM | POA: Diagnosis not present

## 2013-11-26 NOTE — Progress Notes (Deleted)
Name: Susan Glass    DOB: [birth year]  Age: 80 y.o.  MR#: [id number]       PCP:  Alonza Bogus, MD      Insurance: Payor: MEDICARE / Plan: MEDICARE PART B / Product Type: *No Product type* /   CC:    Chief Complaint  Patient presents with  . Coronary Artery Disease  . Hypertension    VS Filed Vitals:   11/26/13 1442  BP: 166/69  Pulse: 86  Height: 5' (1.524 m)  Weight: 125 lb (56.7 kg)    Weights Current Weight  11/26/13 125 lb (56.7 kg)  11/01/12 124 lb 12 oz (56.586 kg)  11/08/11 126 lb (57.153 kg)    Blood Pressure  BP Readings from Last 3 Encounters:  11/26/13 166/69  11/01/12 131/76  11/08/11 154/75     Admit date:  (Not on file) Last encounter with RMR:  08/22/2013   Allergy Review of patient's allergies indicates no known allergies.  Current Outpatient Prescriptions  Medication Sig Dispense Refill  . ALPRAZolam (XANAX) 0.5 MG tablet Take 0.5 mg by mouth 2 (two) times daily.      Marland Kitchen amLODipine (NORVASC) 2.5 MG tablet TAKE ONE TABLET BY MOUTH EVERY DAY  30 tablet  6  . aspirin 81 MG tablet Take 81 mg by mouth daily.        . furosemide (LASIX) 40 MG tablet TAKE ONE TABLET BY MOUTH ONCE DAILY  30 tablet  3  . naproxen sodium (ALEVE) 220 MG tablet Take 220 mg by mouth as needed.        . Omega-3 Fatty Acids (FISH OIL) 1000 MG CAPS Take 1 capsule by mouth daily.         No current facility-administered medications for this visit.    Discontinued Meds:   There are no discontinued medications.  Patient Active Problem List   Diagnosis Date Noted  . Edema extremities 11/17/2010  . CAD, NATIVE VESSEL 08/19/2009  . COMBINED HEART FAILURE, CHRONIC 08/19/2009  . HYPERCHOLESTEROLEMIA 08/18/2009  . HYPERTENSION 08/18/2009  . CHF 08/18/2009    LABS    Component Value Date/Time   NA 141 11/17/2010 1407   NA 139 10/19/2009 2304   NA 142 02/24/2009 1353   K 4.4 11/17/2010 1407   K 4.0 10/19/2009 2304   K 4.2 02/24/2009 1353   CL 105 11/17/2010 1407   CL 107 10/19/2009  2304   CL 109 02/24/2009 1353   CO2 24 11/17/2010 1407   CO2 27 10/19/2009 2304   CO2 28 02/24/2009 1353   GLUCOSE 93 11/17/2010 1407   GLUCOSE 103* 10/19/2009 2304   GLUCOSE 120* 02/24/2009 1353   BUN 23 11/17/2010 1407   BUN 19 10/19/2009 2304   BUN 22 02/24/2009 1353   CREATININE 0.87 11/17/2010 1407   CREATININE 0.84 10/19/2009 2304   CREATININE 0.85 02/24/2009 1353   CREATININE 0.85 12/22/2008   CALCIUM 8.9 11/17/2010 1407   CALCIUM 8.8 10/19/2009 2304   CALCIUM 9.2 02/24/2009 1353   GFRNONAA >60 10/19/2009 2304   GFRNONAA >60 02/24/2009 1353   GFRNONAA >60 11/12/2008 0415   GFRAA  Value: >60        The eGFR has been calculated using the MDRD equation. This calculation has not been validated in all clinical situations. eGFR's persistently <60 mL/min signify possible Chronic Kidney Disease. 10/19/2009 2304   GFRAA  Value: >60        The eGFR has been calculated using the MDRD equation.  This calculation has not been validated in all clinical situations. eGFR's persistently <60 mL/min signify possible Chronic Kidney Disease. 02/24/2009 1353   GFRAA  Value: >60        The eGFR has been calculated using the MDRD equation. This calculation has not been validated in all clinical situations. eGFR's persistently <60 mL/min signify possible Chronic Kidney Disease. 11/12/2008 0415   CMP     Component Value Date/Time   NA 141 11/17/2010 1407   K 4.4 11/17/2010 1407   CL 105 11/17/2010 1407   CO2 24 11/17/2010 1407   GLUCOSE 93 11/17/2010 1407   BUN 23 11/17/2010 1407   CREATININE 0.87 11/17/2010 1407   CREATININE 0.84 10/19/2009 2304   CALCIUM 8.9 11/17/2010 1407   PROT 5.6* 11/11/2008 1304   ALBUMIN 3.3* 11/11/2008 1304   AST 23 11/11/2008 1304   ALT 16 11/11/2008 1304   ALKPHOS 58 11/11/2008 1304   BILITOT 0.5 11/11/2008 1304   GFRNONAA >60 10/19/2009 2304   GFRAA  Value: >60        The eGFR has been calculated using the MDRD equation. This calculation has not been validated in all clinical situations. eGFR's persistently <60 mL/min  signify possible Chronic Kidney Disease. 10/19/2009 2304       Component Value Date/Time   WBC 7.1 11/08/2011 0000   WBC 6.5 10/19/2009 2304   WBC 6.1 11/12/2008 0415   HGB 13.5 11/08/2011 0000   HGB 12.8 10/19/2009 2304   HGB 13.0 02/24/2009 1353   HCT 40.3 11/08/2011 0000   HCT 36.8 10/19/2009 2304   HCT 37.6 02/24/2009 1353   MCV 89.6 11/08/2011 0000   MCV 93.7 10/19/2009 2304   MCV 93.1 11/12/2008 0415    Lipid Panel     Component Value Date/Time   CHOL  Value: 129        ATP III CLASSIFICATION:  <200     mg/dL   Desirable  200-239  mg/dL   Borderline High  >=240    mg/dL   High        11/12/2008 0400   TRIG 68 11/12/2008 0400   HDL 39* 11/12/2008 0400   CHOLHDL 3.3 11/12/2008 0400   VLDL 14 11/12/2008 0400   LDLCALC  Value: 76        Total Cholesterol/HDL:CHD Risk Coronary Heart Disease Risk Table                     Men   Women  1/2 Average Risk   3.4   3.3  Average Risk       5.0   4.4  2 X Average Risk   9.6   7.1  3 X Average Risk  23.4   11.0        Use the calculated Patient Ratio above and the CHD Risk Table to determine the patient's CHD Risk.        ATP III CLASSIFICATION (LDL):  <100     mg/dL   Optimal  100-129  mg/dL   Near or Above                    Optimal  130-159  mg/dL   Borderline  160-189  mg/dL   High  >190     mg/dL   Very High 11/12/2008 0400    ABG    Component Value Date/Time   PHART 7.383 11/11/2008 1608   PCO2ART 36.6 11/11/2008 1608   PO2ART 62.0* 11/11/2008  1608   HCO3 21.8 11/11/2008 1608   TCO2 23 11/11/2008 1608   ACIDBASEDEF 3.0* 11/11/2008 1608   O2SAT 91.0 11/11/2008 1608     Lab Results  Component Value Date   TSH 0.750 ***Test methodology is 3rd generation TSH**** 11/11/2008   BNP (last 3 results) No results found for this basename: PROBNP,  in the last 8760 hours Cardiac Panel (last 3 results) No results found for this basename: CKTOTAL, CKMB, TROPONINI, RELINDX,  in the last 72 hours  Iron/TIBC/Ferritin No results found for this basename: iron, tibc,  ferritin     EKG Orders placed in visit on 11/26/13  . EKG 12-LEAD     Prior Assessment and Plan Problem List as of 11/26/2013     Cardiovascular and Mediastinum   HYPERTENSION   Last Assessment & Plan   11/01/2012 Office Visit Written 11/01/2012  2:13 PM by Lendon Colonel, NP     Good control of blood pressure on amlodipine. She also has Lasix daily. Patient will continue her medications as she is tolerating them well without evidence of lower extremity edema, or dehydration. A followup on annual basis.    CAD, NATIVE VESSEL   Last Assessment & Plan   11/01/2012 Office Visit Written 11/01/2012  2:12 PM by Lendon Colonel, NP     She is without cardiac complaints. She is now been hospitalized or seen in the ER for cardiac issue. In fact she has not been seen in the hospital or ER at all in the last year. She remains as active as she can with her debilitating arthritis in both her knees and back. She remains in good spirits has good family support. She is recently seen by Dr. Luan Pulling who has completed a labs within the last month. We will make no changes on her medications. Dr. Luan Pulling was provided refills on all medication she is taking. We will continue risk management in this very nice elderly woman.    CHF   COMBINED HEART FAILURE, CHRONIC     Other   HYPERCHOLESTEROLEMIA   Last Assessment & Plan   11/01/2012 Office Visit Written 11/01/2012  2:12 PM by Lendon Colonel, NP     Currently controlled. She will continue on medication regimen as directed. She is not on a statin but is taking fish oil tablets.    Edema extremities       Imaging: No results found.

## 2013-11-26 NOTE — Progress Notes (Signed)
    HPI: Susan Glass, is a 80 year old former patient of Dr. Verl Blalock to be est. with Dr. Bronson Ing we are following for ongoing assessment and management of diastolic CHF, CAD, and hypertension, with history of debilitating arthritis. She was last seen in the office one year ago and was stable from a cardiac standpoint.   She comes today for annual followup and is without complaints with exception of some mild lightheadedness. She is normally followed by Dr. Luan Pulling for medical management. Has not been admitted to the hospital or been seen in the emergency room.        No Known Allergies  Current Outpatient Prescriptions  Medication Sig Dispense Refill  . ALPRAZolam (XANAX) 0.5 MG tablet Take 0.5 mg by mouth 2 (two) times daily.      Marland Kitchen amLODipine (NORVASC) 2.5 MG tablet TAKE ONE TABLET BY MOUTH EVERY DAY  30 tablet  6  . aspirin 81 MG tablet Take 81 mg by mouth daily.        . furosemide (LASIX) 40 MG tablet TAKE ONE TABLET BY MOUTH ONCE DAILY  30 tablet  3  . naproxen sodium (ALEVE) 220 MG tablet Take 220 mg by mouth as needed.        . Omega-3 Fatty Acids (FISH OIL) 1000 MG CAPS Take 1 capsule by mouth daily.         No current facility-administered medications for this visit.    Past Medical History  Diagnosis Date  . Hypertension   . Hyperlipidemia     hypercholesterolemia  . CAD (coronary artery disease)   . CHF (congestive heart failure)     Past Surgical History  Procedure Laterality Date  . Cholecystectomy    . Appendectomy      ROS:  Review of systems complete and found to be negative unless listed above  PHYSICAL EXAM BP 166/69  Pulse 86  Ht 5' (1.524 m)  Wt 125 lb (56.7 kg)  BMI 24.41 kg/m2 General: Well developed, well nourished, in no acute distress Head: Eyes PERRLA, No xanthomas.   Normal cephalic and atramatic  Lungs: Clear bilaterally to auscultation and percussion. Heart: HRRR S1 S2, without MRG.  Pulses are 2+ & equal.            No carotid bruit. No  JVD.  No abdominal bruits. No femoral bruits. Abdomen: Bowel sounds are positive, abdomen soft and non-tender without masses or                  Hernia's noted. Msk:  Back normal, normal gait. Normal strength and tone for age. Debilitating arthritis of her knees, bilateral, and bilateral arthritis of her hands and elbows. Extremities: No clubbing, cyanosis or edema.  DP +1 Neuro: Alert and oriented X 3. Psych:  Good affect, responds appropriately   ASSESSMENT AND PLAN

## 2013-11-26 NOTE — Assessment & Plan Note (Signed)
She is completely asymptomatic from a cardiac standpoint. She denies any recurrent chest pain or dyspnea on exertion. The patient is basically sedentary due to her debilitating arthritis. She will be continued on current medication regimen to include aspirin 81 mg daily. No cardiac testing is planned. We will see her in one year unless she is symptomatic.

## 2013-11-26 NOTE — Assessment & Plan Note (Signed)
No evidence of fluid overload. As stated I was concerned about her dose of Lasix causing her to be lightheaded. She is not orthostatic. We will order a BMET, and a CBC. She is to be seen again in one year. She is avoiding salty foods. She is on Aleve daily due to her arthritis.

## 2013-11-26 NOTE — Assessment & Plan Note (Signed)
Blood pressure is well-controlled. I concern about her dose of Lasix at 40 mg daily causing her to be mildly lightheaded. Orthostatic blood pressures were completed in the office. Lying 118/76 with a heart rate of 76, sitting 122/78 with a heart rate of 83, standing 138/82 with a heart rate of 88. She will continue amlodipine 2.5 mg daily as directed to

## 2013-11-26 NOTE — Patient Instructions (Signed)
Your physician recommends that you schedule a follow-up appointment in: 1 year with Dr Koneswaran You will receive a reminder letter two months in advance reminding you to call and schedule your appointment. If you don't receive this letter, please contact our office.  Your physician recommends that you continue on your current medications as directed. Please refer to the Current Medication list given to you today.     

## 2013-11-27 NOTE — Addendum Note (Signed)
Addended by: Barbarann Ehlers A on: 11/27/2013 08:51 AM   Modules accepted: Orders

## 2013-11-27 NOTE — Addendum Note (Signed)
Addended by: Barbarann Ehlers A on: 11/27/2013 12:38 PM   Modules accepted: Orders

## 2013-12-03 ENCOUNTER — Telehealth: Payer: Self-pay | Admitting: *Deleted

## 2013-12-03 NOTE — Telephone Encounter (Signed)
Pt is just calling to let us know that she is not going to get labs done at this time. She has been taking care of her son and just doesn't have time. She feels like her kidneys are doing well. She has been having clear out put.

## 2013-12-03 NOTE — Telephone Encounter (Signed)
Ok thanks 

## 2013-12-03 NOTE — Telephone Encounter (Signed)
FYI: Told the pt she could get in done later if she would like. Pt states her son is sick at the moment and that's her only transportation.

## 2013-12-20 ENCOUNTER — Other Ambulatory Visit: Payer: Self-pay | Admitting: Adult Health

## 2014-01-14 DIAGNOSIS — I5042 Chronic combined systolic (congestive) and diastolic (congestive) heart failure: Secondary | ICD-10-CM | POA: Diagnosis not present

## 2014-01-14 DIAGNOSIS — I10 Essential (primary) hypertension: Secondary | ICD-10-CM | POA: Diagnosis not present

## 2014-01-14 DIAGNOSIS — F411 Generalized anxiety disorder: Secondary | ICD-10-CM | POA: Diagnosis not present

## 2014-01-14 DIAGNOSIS — R6889 Other general symptoms and signs: Secondary | ICD-10-CM | POA: Diagnosis not present

## 2014-01-14 DIAGNOSIS — M1990 Unspecified osteoarthritis, unspecified site: Secondary | ICD-10-CM | POA: Diagnosis not present

## 2014-01-15 LAB — CBC
HCT: 39.1 % (ref 36.0–46.0)
Hemoglobin: 13.4 g/dL (ref 12.0–15.0)
MCH: 29.5 pg (ref 26.0–34.0)
MCHC: 34.3 g/dL (ref 30.0–36.0)
MCV: 86.1 fL (ref 78.0–100.0)
PLATELETS: 206 10*3/uL (ref 150–400)
RBC: 4.54 MIL/uL (ref 3.87–5.11)
RDW: 14.7 % (ref 11.5–15.5)
WBC: 7.5 10*3/uL (ref 4.0–10.5)

## 2014-01-15 LAB — BASIC METABOLIC PANEL
BUN: 20 mg/dL (ref 6–23)
CALCIUM: 9.3 mg/dL (ref 8.4–10.5)
CHLORIDE: 101 meq/L (ref 96–112)
CO2: 28 mEq/L (ref 19–32)
CREATININE: 0.77 mg/dL (ref 0.50–1.10)
Glucose, Bld: 84 mg/dL (ref 70–99)
Potassium: 4.5 mEq/L (ref 3.5–5.3)
Sodium: 137 mEq/L (ref 135–145)

## 2014-03-21 ENCOUNTER — Other Ambulatory Visit: Payer: Self-pay | Admitting: Adult Health

## 2014-07-21 ENCOUNTER — Other Ambulatory Visit: Payer: Self-pay | Admitting: Cardiovascular Disease

## 2014-07-21 MED ORDER — FUROSEMIDE 40 MG PO TABS: 40.0000 mg | ORAL_TABLET | Freq: Every day | ORAL | Status: DC

## 2014-07-21 NOTE — Telephone Encounter (Signed)
Refill complete 

## 2014-07-21 NOTE — Telephone Encounter (Signed)
Received fax refill request  Rx # [id number]  Medication:  Furosemide 40 mg tab  Qty 30 Sig:  Take one tablet by mouth once daily Physician:  Bronson Ing

## 2014-09-22 DIAGNOSIS — I509 Heart failure, unspecified: Secondary | ICD-10-CM | POA: Diagnosis not present

## 2014-09-22 DIAGNOSIS — M159 Polyosteoarthritis, unspecified: Secondary | ICD-10-CM | POA: Diagnosis not present

## 2014-09-22 DIAGNOSIS — I10 Essential (primary) hypertension: Secondary | ICD-10-CM | POA: Diagnosis not present

## 2014-09-22 DIAGNOSIS — I2510 Atherosclerotic heart disease of native coronary artery without angina pectoris: Secondary | ICD-10-CM | POA: Diagnosis not present

## 2014-10-22 ENCOUNTER — Other Ambulatory Visit: Payer: Self-pay | Admitting: Adult Health

## 2014-11-27 ENCOUNTER — Encounter (HOSPITAL_COMMUNITY): Payer: Self-pay | Admitting: *Deleted

## 2014-11-27 ENCOUNTER — Inpatient Hospital Stay (HOSPITAL_COMMUNITY)
Admission: EM | Admit: 2014-11-27 | Discharge: 2014-11-29 | DRG: 378 | Disposition: A | Discharge status: Home Health | Payer: Medicare Other | Attending: Pulmonary Disease | Admitting: Pulmonary Disease

## 2014-11-27 DIAGNOSIS — E780 Pure hypercholesterolemia: Secondary | ICD-10-CM | POA: Diagnosis present

## 2014-11-27 DIAGNOSIS — I5042 Chronic combined systolic (congestive) and diastolic (congestive) heart failure: Secondary | ICD-10-CM | POA: Diagnosis present

## 2014-11-27 DIAGNOSIS — K209 Esophagitis, unspecified without bleeding: Secondary | ICD-10-CM | POA: Diagnosis present

## 2014-11-27 DIAGNOSIS — K259 Gastric ulcer, unspecified as acute or chronic, without hemorrhage or perforation: Secondary | ICD-10-CM | POA: Diagnosis not present

## 2014-11-27 DIAGNOSIS — I10 Essential (primary) hypertension: Secondary | ICD-10-CM | POA: Diagnosis not present

## 2014-11-27 DIAGNOSIS — R531 Weakness: Secondary | ICD-10-CM | POA: Diagnosis not present

## 2014-11-27 DIAGNOSIS — K2970 Gastritis, unspecified, without bleeding: Secondary | ICD-10-CM | POA: Diagnosis present

## 2014-11-27 DIAGNOSIS — R1110 Vomiting, unspecified: Secondary | ICD-10-CM | POA: Diagnosis not present

## 2014-11-27 DIAGNOSIS — I2510 Atherosclerotic heart disease of native coronary artery without angina pectoris: Secondary | ICD-10-CM | POA: Diagnosis present

## 2014-11-27 DIAGNOSIS — K922 Gastrointestinal hemorrhage, unspecified: Secondary | ICD-10-CM | POA: Diagnosis present

## 2014-11-27 DIAGNOSIS — M1990 Unspecified osteoarthritis, unspecified site: Secondary | ICD-10-CM | POA: Diagnosis present

## 2014-11-27 DIAGNOSIS — D62 Acute posthemorrhagic anemia: Secondary | ICD-10-CM | POA: Diagnosis not present

## 2014-11-27 DIAGNOSIS — K921 Melena: Secondary | ICD-10-CM | POA: Diagnosis not present

## 2014-11-27 DIAGNOSIS — R030 Elevated blood-pressure reading, without diagnosis of hypertension: Secondary | ICD-10-CM | POA: Diagnosis not present

## 2014-11-27 DIAGNOSIS — K208 Other esophagitis: Secondary | ICD-10-CM | POA: Diagnosis present

## 2014-11-27 DIAGNOSIS — K222 Esophageal obstruction: Secondary | ICD-10-CM | POA: Diagnosis not present

## 2014-11-27 DIAGNOSIS — R110 Nausea: Secondary | ICD-10-CM | POA: Diagnosis not present

## 2014-11-27 DIAGNOSIS — K2990 Gastroduodenitis, unspecified, without bleeding: Secondary | ICD-10-CM | POA: Diagnosis present

## 2014-11-27 DIAGNOSIS — R42 Dizziness and giddiness: Secondary | ICD-10-CM | POA: Diagnosis not present

## 2014-11-27 DIAGNOSIS — K449 Diaphragmatic hernia without obstruction or gangrene: Secondary | ICD-10-CM | POA: Diagnosis present

## 2014-11-27 DIAGNOSIS — E785 Hyperlipidemia, unspecified: Secondary | ICD-10-CM | POA: Diagnosis present

## 2014-11-27 DIAGNOSIS — K2210 Ulcer of esophagus without bleeding: Secondary | ICD-10-CM | POA: Diagnosis not present

## 2014-11-27 LAB — CBC
HEMATOCRIT: 27.1 % — AB (ref 36.0–46.0)
HEMOGLOBIN: 9.2 g/dL — AB (ref 12.0–15.0)
MCH: 30.2 pg (ref 26.0–34.0)
MCHC: 33.9 g/dL (ref 30.0–36.0)
MCV: 88.9 fL (ref 78.0–100.0)
Platelets: 157 10*3/uL (ref 150–400)
RBC: 3.05 MIL/uL — AB (ref 3.87–5.11)
RDW: 14.4 % (ref 11.5–15.5)
WBC: 12.5 10*3/uL — AB (ref 4.0–10.5)

## 2014-11-27 LAB — IRON AND TIBC
IRON: 20 ug/dL — AB (ref 42–145)
Saturation Ratios: 6 % — ABNORMAL LOW (ref 20–55)
TIBC: 316 ug/dL (ref 250–470)
UIBC: 296 ug/dL (ref 125–400)

## 2014-11-27 LAB — CBC WITH DIFFERENTIAL/PLATELET
BASOS ABS: 0 10*3/uL (ref 0.0–0.1)
BASOS PCT: 0 % (ref 0–1)
Eosinophils Absolute: 0 10*3/uL (ref 0.0–0.7)
Eosinophils Relative: 0 % (ref 0–5)
HCT: 19.7 % — ABNORMAL LOW (ref 36.0–46.0)
HEMOGLOBIN: 6.5 g/dL — AB (ref 12.0–15.0)
Lymphocytes Relative: 23 % (ref 12–46)
Lymphs Abs: 2.1 10*3/uL (ref 0.7–4.0)
MCH: 30.2 pg (ref 26.0–34.0)
MCHC: 33 g/dL (ref 30.0–36.0)
MCV: 91.6 fL (ref 78.0–100.0)
MONOS PCT: 6 % (ref 3–12)
Monocytes Absolute: 0.6 10*3/uL (ref 0.1–1.0)
NEUTROS ABS: 6.3 10*3/uL (ref 1.7–7.7)
NEUTROS PCT: 70 % (ref 43–77)
Platelets: 202 10*3/uL (ref 150–400)
RBC: 2.15 MIL/uL — AB (ref 3.87–5.11)
RDW: 13.7 % (ref 11.5–15.5)
WBC: 8.9 10*3/uL (ref 4.0–10.5)

## 2014-11-27 LAB — COMPREHENSIVE METABOLIC PANEL
ALBUMIN: 3.4 g/dL — AB (ref 3.5–5.2)
ALT: 10 U/L (ref 0–35)
ANION GAP: 9 (ref 5–15)
AST: 18 U/L (ref 0–37)
Alkaline Phosphatase: 45 U/L (ref 39–117)
BUN: 64 mg/dL — ABNORMAL HIGH (ref 6–23)
CO2: 24 mmol/L (ref 19–32)
CREATININE: 0.79 mg/dL (ref 0.50–1.10)
Calcium: 8.6 mg/dL (ref 8.4–10.5)
Chloride: 106 mmol/L (ref 96–112)
GFR calc Af Amer: 79 mL/min — ABNORMAL LOW (ref >90)
GFR calc non Af Amer: 68 mL/min — ABNORMAL LOW (ref >90)
Glucose, Bld: 122 mg/dL — ABNORMAL HIGH (ref 70–99)
Potassium: 4.3 mmol/L (ref 3.5–5.1)
Sodium: 139 mmol/L (ref 135–145)
TOTAL PROTEIN: 5.7 g/dL — AB (ref 6.0–8.3)
Total Bilirubin: 0.4 mg/dL (ref 0.3–1.2)

## 2014-11-27 LAB — FOLATE: Folate: 12.4 ng/mL

## 2014-11-27 LAB — POC OCCULT BLOOD, ED: Fecal Occult Bld: POSITIVE — AB

## 2014-11-27 LAB — FERRITIN: Ferritin: 12 ng/mL (ref 10–291)

## 2014-11-27 LAB — RETICULOCYTES
RBC.: 1.98 MIL/uL — ABNORMAL LOW (ref 3.87–5.11)
Retic Count, Absolute: 61.4 10*3/uL (ref 19.0–186.0)
Retic Ct Pct: 3.1 % (ref 0.4–3.1)

## 2014-11-27 LAB — PREPARE RBC (CROSSMATCH)

## 2014-11-27 LAB — ABO/RH: ABO/RH(D): O POS

## 2014-11-27 LAB — VITAMIN B12: VITAMIN B 12: 136 pg/mL — AB (ref 211–911)

## 2014-11-27 MED ORDER — ACETAMINOPHEN 650 MG RE SUPP: 650.0000 mg | Freq: Four times a day (QID) | RECTAL | Status: DC | PRN

## 2014-11-27 MED ORDER — SODIUM CHLORIDE 0.9 % IJ SOLN
3.0000 mL | Freq: Two times a day (BID) | INTRAMUSCULAR | Status: DC
  Administered 2014-11-27 – 2014-11-28 (×3): 3 mL via INTRAVENOUS

## 2014-11-27 MED ORDER — PANTOPRAZOLE SODIUM 40 MG IV SOLR
40.0000 mg | Freq: Once | INTRAVENOUS | Status: AC
  Administered 2014-11-27: 40 mg via INTRAVENOUS
  Filled 2014-11-27: qty 40

## 2014-11-27 MED ORDER — FUROSEMIDE 40 MG PO TABS
40.0000 mg | ORAL_TABLET | Freq: Every day | ORAL | Status: DC
  Administered 2014-11-27: 40 mg via ORAL
  Filled 2014-11-27: qty 1

## 2014-11-27 MED ORDER — TRAZODONE HCL 50 MG PO TABS: 25.0000 mg | ORAL_TABLET | Freq: Every evening | ORAL | Status: DC | PRN

## 2014-11-27 MED ORDER — ACETAMINOPHEN 325 MG PO TABS: 650.0000 mg | ORAL_TABLET | Freq: Four times a day (QID) | ORAL | Status: DC | PRN

## 2014-11-27 MED ORDER — SODIUM CHLORIDE 0.9 % IJ SOLN: 3.0000 mL | INTRAMUSCULAR | Status: DC | PRN

## 2014-11-27 MED ORDER — ALPRAZOLAM 0.5 MG PO TABS
0.5000 mg | ORAL_TABLET | Freq: Two times a day (BID) | ORAL | Status: DC
  Administered 2014-11-27 – 2014-11-29 (×4): 0.5 mg via ORAL
  Filled 2014-11-27 (×5): qty 1

## 2014-11-27 MED ORDER — PANTOPRAZOLE SODIUM 40 MG IV SOLR
40.0000 mg | Freq: Two times a day (BID) | INTRAVENOUS | Status: DC
  Administered 2014-11-27 – 2014-11-28 (×2): 40 mg via INTRAVENOUS
  Filled 2014-11-27 (×2): qty 40

## 2014-11-27 MED ORDER — HYDROCODONE-ACETAMINOPHEN 5-325 MG PO TABS: 1.0000 | ORAL_TABLET | ORAL | Status: DC | PRN

## 2014-11-27 MED ORDER — ONDANSETRON HCL 4 MG PO TABS: 4.0000 mg | ORAL_TABLET | Freq: Four times a day (QID) | ORAL | Status: DC | PRN

## 2014-11-27 MED ORDER — SODIUM CHLORIDE 0.9 % IV SOLN: Freq: Once | INTRAVENOUS | Status: DC

## 2014-11-27 MED ORDER — BISACODYL 5 MG PO TBEC: 5.0000 mg | DELAYED_RELEASE_TABLET | Freq: Every day | ORAL | Status: DC | PRN

## 2014-11-27 MED ORDER — SODIUM CHLORIDE 0.9 % IV SOLN: 250.0000 mL | INTRAVENOUS | Status: DC | PRN

## 2014-11-27 MED ORDER — SODIUM CHLORIDE 0.9 % IV BOLUS (SEPSIS)
250.0000 mL | Freq: Once | INTRAVENOUS | Status: AC
  Administered 2014-11-27: 250 mL via INTRAVENOUS

## 2014-11-27 MED ORDER — ONDANSETRON HCL 4 MG/2ML IJ SOLN: 4.0000 mg | Freq: Four times a day (QID) | INTRAMUSCULAR | Status: DC | PRN

## 2014-11-27 NOTE — ED Notes (Addendum)
Hospitalist at bedside 

## 2014-11-27 NOTE — Progress Notes (Signed)
2nd of RBC's began transfusing at 1420. Vital signs within range for pt. Will monitor

## 2014-11-27 NOTE — ED Notes (Signed)
Pt states dizziness began yesterday, also states hx of vertigo. States bilateral ear pain, left side hurting more, which began during the night. States diarrhea began yesterday, states, three lose stools, "black as black can be."

## 2014-11-27 NOTE — H&P (Signed)
[facility] History and Physical  KEANU FRICKEY JXB:[id number] DOB: December 12, 1916 DOA: 11/27/2014  Referring physician:  PCP: Alonza Bogus, MD   Chief Complaint: weakness/dizziness  HPI: Susan Glass is a very pleasant 81 y.o. female with a past medical history that includes hypertension, chronic combined heart failure, CAD, resents to the emergency department with the chief complaint of weakness and dizziness that began 3 days ago. Initial evaluation in the emergency department reveals hemoglobin of 6.5 and heme positive stool.  Patient reports 2 days ago she had 3 or 4 episodes of large amount very loose stool that was very dark. Since that time she has felt weak and dizzy particularly with position change. Associated symptoms include nausea without vomiting and decreased by mouth intake. She denies headache shortness of breath chest pain palpitations abdominal pain syncope or near-syncope. She denies fever chills dysuria hematuria frequency or urgency. Home medications include Aleve which she takes "fairly frequently" for arthritic pain. She does report having had a colonoscopy in the past approximately 3 years ago and had been told she had polyps.  Workup in the emergency department includes a complete blood count significant for hemoglobin of 6.5 hematocrit 19.7 otherwise unremarkable, basic metabolic panel significant for BUN of 64 and serum glucose of 122. FOBT positive, KG with sinus rhythm prolonged QT. In the emergency department she is afebrile hemodynamically stable and not hypoxic. She is typed and crossed and 2 units of packed RBCs were ordered the first unit being hung during my examination.    Review of Systems:  10 point review of systems completed and all systems are negative except as indicated in the history of present illness Past Medical History  Diagnosis Date  . Hypertension   . Hyperlipidemia     hypercholesterolemia  . CAD (coronary artery disease)   .  CHF (congestive heart failure)    Past Surgical History  Procedure Laterality Date  . Cholecystectomy    . Appendectomy     Social History:  reports that she has never smoked. She has never used smokeless tobacco. She reports that she does not drink alcohol or use illicit drugs. She is retired she lives in an apartment alone she has had no recent falls she does use a walker she cooks and does her laundry but has family that assists her with housecleaning and transportation to appointments. She is fairly independent with ADLs No Known Allergies  No family history on file. family medical history is reviewed and noncontributory to the admission of this 81 year old female  Prior to Admission medications   Medication Sig Start Date End Date Taking? Authorizing Provider  ALPRAZolam Duanne Moron) 0.5 MG tablet Take 0.5 mg by mouth 2 (two) times daily.   Yes Historical Provider, MD  amLODipine (NORVASC) 2.5 MG tablet TAKE ONE TABLET BY MOUTH ONCE DAILY 10/22/14  Yes Lendon Colonel, NP  aspirin 81 MG tablet Take 81 mg by mouth daily.     Yes Historical Provider, MD  furosemide (LASIX) 40 MG tablet Take 1 tablet (40 mg total) by mouth daily. 07/21/14  Yes Herminio Commons, MD  naproxen sodium (ALEVE) 220 MG tablet Take 220 mg by mouth as needed (pain).    Yes Historical Provider, MD  Omega-3 Fatty Acids (FISH OIL) 1000 MG CAPS Take 1 capsule by mouth daily.     Yes Historical Provider, MD   Physical Exam: Filed Vitals:   11/27/14 1030 11/27/14 1041 11/27/14 1100 11/27/14 1105  BP: 111/53 120/55 116/44 116/44  Pulse: 77 77 82 84  Temp:  97.7 F (36.5 C)  97.9 F (36.6 C)  TempSrc:  Oral  Oral  Resp: 14 20 17 12   Height:      Weight:      SpO2: 100% 98% 97% 100%    Wt Readings from Last 3 Encounters:  11/27/14 56.7 kg (125 lb)  11/26/13 56.7 kg (125 lb)  11/01/12 56.586 kg (124 lb 12 oz)    General:  Appears calm and comfortable somewhat thin and frail and pale Eyes: PERRL, normal lids,  irises & conjunctiva ENT: grossly normal hearing, lips & tongue mucous membranes of her mouth are pale slightly dry Neck: no LAD, masses or thyromegaly Cardiovascular: RRR, no m/r/g. Trace lower extremity edema.   Respiratory: CTA bilaterally, Normal respiratory effort. Rest sounds slightly diminished throughout no wheezes no rhonchi Abdomen: soft, ntnd positive bowel sounds somewhat sluggish nondistended no guarding or rebounding Skin: no rash or induration seen on limited exam Musculoskeletal: grossly normal tone BUE/BLE Psychiatric: grossly normal mood and affect, speech fluent and appropriate Neurologic: grossly non-focal. Speech clear facial symmetry she is alert and oriented 3           Labs on Admission:  Basic Metabolic Panel:  Recent Labs Lab 11/27/14 0842  NA 139  K 4.3  CL 106  CO2 24  GLUCOSE 122*  BUN 64*  CREATININE 0.79  CALCIUM 8.6   Liver Function Tests:  Recent Labs Lab 11/27/14 0842  AST 18  ALT 10  ALKPHOS 45  BILITOT 0.4  PROT 5.7*  ALBUMIN 3.4*   No results for input(s): LIPASE, AMYLASE in the last 168 hours. No results for input(s): AMMONIA in the last 168 hours. CBC:  Recent Labs Lab 11/27/14 0842  WBC 8.9  NEUTROABS 6.3  HGB 6.5*  HCT 19.7*  MCV 91.6  PLT 202   Cardiac Enzymes: No results for input(s): CKTOTAL, CKMB, CKMBINDEX, TROPONINI in the last 168 hours.  BNP (last 3 results) No results for input(s): BNP in the last 8760 hours.  ProBNP (last 3 results) No results for input(s): PROBNP in the last 8760 hours.  CBG: No results for input(s): GLUCAP in the last 168 hours.  Radiological Exams on Admission: No results found.  EKG: Independently reviewed sinus rhythm with a prolonged QT  Assessment/Plan Principal Problem:   Acute blood loss anemia: Likely related to GI bleed. Patient does take Aleve fairly frequently. No history of the same. She does report being diagnosed with polyps on last colonoscopy at least 3  years ago. Will admit to MedSurg. 2 units of packed RBCs have been ordered. Will check a CBC later this evening. She's had no melena for 36 hours. Active Problems:    Essential hypertension GI bleed: Positive FOBT. We'll continue protonic every 12. She does take Aleve fairly frequently. Will request gastroenterology consult. Provider with clear liquids until they see her. Monitor vital signs closely. Home medications include 81 mg of aspirin all hold this. See #1  Weakness: Likely related to #1. However son does report gradual decline over the last year or so as she has stopped going to church due to weakness. Will give her 2 units of packed red blood cells as indicated and request PT consult tomorrow. She may benefit from assisted living or short-term rehabilitation.    COMBINED HEART FAILURE, CHRONIC: Compensated. Last cardiology visit 1 year ago exactly. Home medications include 40 mg of Lasix daily. I will continue this today specifically as she  is getting 2 units of packed RBCs. Blood pressure fairly stable. He does not appear overloaded. Will monitor intake and output and obtain daily weights.   Hypertension: She is on Lasix 40 mg daily as well as amlodipine. I will hold the amlodipine for now. I will give her Lasix as she is given 2 units of blood. Will monitor closely  CAD. Denies chest pain. I will hold her aspirin due to #2.  Arthritis. Fairly debilitating. She takes Aleve almost daily. Will hold this for now. PT consult tomorrow   gastroenterology      Code Status: DNR DVT Prophylaxis: Family Communication: son at bedside Disposition Plan: may benefit assisted living  Time spent: 50 minutes  [facility] Pager [phone]

## 2014-11-27 NOTE — Consult Note (Signed)
Referring Provider: No ref. provider found Primary Care Physician:  Alonza Bogus, MD Primary Gastroenterologist:  Dr.  Date of Admission: 11/27/14 Date of Consultation: 11/27/14  Reason for Consultation:  Anemia, melena  HPI:  81 year old female presented to the emergency room with onset of weakness and dizziness from 3 days ago and hemoglobin is 6.5 as well as heme positive stool. Patient admitted 3-4 episodes of large tarry loose black sticky stools approximately 2 days ago. Since then she has had progressive weakness and dizziness especially with positional changes. She also admits intermittent nausea. She also states she has esophageal burning and has to take TUMS after every meal. She also admits dysphagia and sensation of food getting "stuck." Denies hematochezia. States at night she lies flat on her back to sleep without any issue. She denies shortness of breath and chest pain, palpitations, syncope, near syncope. She admits to taking daily aspirin as well as Aleve "fairly regularly" for her arthritis, but there is some question as to the accuracy of this from her son and PCPs reports.. She had a colonoscopy approximately 3 years ago which patient states showed she had polyps, however no records are available in the system. Emergency department she was hemodynamically stable. She was typed and crossed 2 units of PRBCs. She was subsequently admitted to the medical floor for further evaluation and treatment. Per admitting provider, based on son's report, she cares for herself at home, does her own cooking and laundry. Is very self-sufficient.   Past Medical History  Diagnosis Date  . Hypertension   . Hyperlipidemia     hypercholesterolemia  . CAD (coronary artery disease)   . CHF (congestive heart failure)     Past Surgical History  Procedure Laterality Date  . Cholecystectomy    . Appendectomy      Prior to Admission medications   Medication Sig Start Date End Date Taking?  Authorizing Provider  ALPRAZolam Duanne Moron) 0.5 MG tablet Take 0.5 mg by mouth 2 (two) times daily.   Yes Historical Provider, MD  amLODipine (NORVASC) 2.5 MG tablet TAKE ONE TABLET BY MOUTH ONCE DAILY 10/22/14  Yes Lendon Colonel, NP  aspirin 81 MG tablet Take 81 mg by mouth daily.     Yes Historical Provider, MD  furosemide (LASIX) 40 MG tablet Take 1 tablet (40 mg total) by mouth daily. 07/21/14  Yes Herminio Commons, MD  naproxen sodium (ALEVE) 220 MG tablet Take 220 mg by mouth as needed (pain).    Yes Historical Provider, MD  Omega-3 Fatty Acids (FISH OIL) 1000 MG CAPS Take 1 capsule by mouth daily.     Yes Historical Provider, MD    Current Facility-Administered Medications  Medication Dose Route Frequency Provider Last Rate Last Dose  . 0.9 %  sodium chloride infusion   Intravenous Once Milton Ferguson, MD      . 0.9 %  sodium chloride infusion  250 mL Intravenous PRN Radene Gunning, NP      . acetaminophen (TYLENOL) tablet 650 mg  650 mg Oral Q6H PRN Radene Gunning, NP       Or  . acetaminophen (TYLENOL) suppository 650 mg  650 mg Rectal Q6H PRN Radene Gunning, NP      . ALPRAZolam Duanne Moron) tablet 0.5 mg  0.5 mg Oral BID Lezlie Octave Black, NP   0.5 mg at 11/27/14 1256  . bisacodyl (DULCOLAX) EC tablet 5 mg  5 mg Oral Daily PRN Radene Gunning, NP      .  furosemide (LASIX) tablet 40 mg  40 mg Oral Daily Radene Gunning, NP   40 mg at 11/27/14 1256  . HYDROcodone-acetaminophen (NORCO/VICODIN) 5-325 MG per tablet 1-2 tablet  1-2 tablet Oral Q4H PRN Radene Gunning, NP      . ondansetron [facility]) tablet 4 mg  4 mg Oral Q6H PRN Radene Gunning, NP       Or  . ondansetron [facility]) injection 4 mg  4 mg Intravenous Q6H PRN Radene Gunning, NP      . pantoprazole (PROTONIX) injection 40 mg  40 mg Intravenous Q12H Lezlie Octave Black, NP      . sodium chloride 0.9 % injection 3 mL  3 mL Intravenous Q12H Radene Gunning, NP   3 mL at 11/27/14 1256  . sodium chloride 0.9 % injection 3 mL  3 mL Intravenous PRN Radene Gunning, NP      . traZODone (DESYREL) tablet 25 mg  25 mg Oral QHS PRN Radene Gunning, NP        Allergies as of 11/27/2014  . (No Known Allergies)    History reviewed. No pertinent family history.  History   Social History  . Marital Status: Married    Spouse Name: N/A  . Number of Children: N/A  . Years of Education: N/A   Occupational History  . Not on file.   Social History Main Topics  . Smoking status: Never Smoker   . Smokeless tobacco: Never Used  . Alcohol Use: No  . Drug Use: No  . Sexual Activity: Not on file   Other Topics Concern  . Not on file   Social History Narrative    Review of Systems: Gen: Denies fever, chills, loss of appetite, change in weight or weight loss CV: Denies chest pain, heart palpitations, syncope. Admits baseline LE edema per her history, currently stable.  Resp: Denies shortness of breath with rest, cough, wheezing GI: See HPI. Denies vomiting blood, jaundice, and fecal incontinence.  MS: Admits arthritis pain. Derm: Denies rash, itching, dry skin Psych: Denies depression, anxiety,confusion. Heme: Denies bruising, bleeding, and enlarged lymph nodes.  Physical Exam: Vital signs in last 24 hours: Temp:  [97.6 F (36.4 C)-98.6 F (37 C)] 97.9 F (36.6 C) (04/14 1435) Pulse Rate:  [63-89] 71 (04/14 1435) Resp:  [12-20] 14 (04/14 1435) BP: (101-129)/(43-76) 111/53 mmHg (04/14 1435) SpO2:  [96 %-100 %] 97 % (04/14 1435) Weight:  [125 lb (56.7 kg)] 125 lb (56.7 kg) (04/14 0832) Last BM Date: 11/25/14 General:   Alert,  Well-developed, well-nourished, pleasant and cooperative in NAD. Lying comfortably in bed. Head:  Normocephalic and atraumatic. Eyes:  Sclera clear, no icterus. Conjunctiva pale. Ears:  Normal auditory acuity. Mouth:  No deformity or lesions. No OP edema. Neck:  Supple; no masses or thyromegaly. Lungs:  Clear throughout to auscultation.   No wheezes, crackles, or rhonchi. No acute distress. Heart:  Regular rate  and rhythm; no murmurs, clicks, rubs,  or gallops. Abdomen:  Soft, nontender and nondistended. No masses, hepatosplenomegaly or hernias noted. Normal bowel sounds, without guarding, and without rebound.   Rectal:  Deferred.   Pulses:  Normal 2+ DP pulses noted. Extremities:  Without clubbing. Mild non-pitting bilateral LE edema. Neurologic:  Alert and  oriented;  grossly normal neurologically. Skin:  Intact without significant lesions or rashes. Psych:  Alert and cooperative. Normal mood and affect.  Intake/Output from previous day:   Intake/Output this shift:    Lab Results:  Recent Labs  11/27/14 0842  WBC 8.9  HGB 6.5*  HCT 19.7*  PLT 202   BMET  Recent Labs  11/27/14 0842  NA 139  K 4.3  CL 106  CO2 24  GLUCOSE 122*  BUN 64*  CREATININE 0.79  CALCIUM 8.6   LFT  Recent Labs  11/27/14 0842  PROT 5.7*  ALBUMIN 3.4*  AST 18  ALT 10  ALKPHOS 45  BILITOT 0.4   PT/INR No results for input(s): LABPROT, INR in the last 72 hours. Hepatitis Panel No results for input(s): HEPBSAG, HCVAB, HEPAIGM, HEPBIGM in the last 72 hours. C-Diff No results for input(s): CDIFFTOX in the last 72 hours.  Studies/Results: No results found.  Impression: 81 year old female with melena stools/GI bleed and anemia. Melena occurred about 2 days ago for 3-4 large dark loose bowel movements. No recurrent melena or hematochezia since. No hematemesis. Takes daily 81 mg ASA and possibly additional NSAIDs for arthritis pain. Also describes GERD symptoms requiring TUMS with every meal and dysphagia. GI bleed possibly from reflux esophagitis, NSAID gastritis, or PUD. Currently receiveing 2nd unit of PRBC as well as fluids. Continued weakness, lightheadedness especially with positional changes. Remains hemodynamically stable.  Plan: 1. Continue bid PPI 2. Closely monitor H/H, transfuse as necessary 3. Closely monitor for any signs of recurrent GI bleed 4. NPO after midnight for diagnostic  endoscopy tomorrow with possible dilation   Walden Field, AGNP-C Adult & Gerontological Nurse Practitioner [facility]    LOS: 0 days     11/27/2014, 3:15 PM

## 2014-11-27 NOTE — ED Notes (Addendum)
CRITICAL VALUE ALERT  Critical value received:  Hgb 6.5  Date of notification:11/27/2014  Time of notification:  0930 Critical value read back: yes  Nurse who received alert: Di Kindle, RN  MD notified (1st page):  Dr.Zammit  Time of first page:  0930  MD notified (2nd page):  Time of second page:  Responding MD:  Dr. Roderic Palau Time MD responded: 0300

## 2014-11-27 NOTE — ED Provider Notes (Signed)
CSN: [id number]     Arrival date & time 11/27/14  1696 History  This chart was scribed for Milton Ferguson, MD by Mercy Moore, ED scribe.  This patient was seen in room APA12/APA12 and the patient's care was started at 8:47 AM.   Chief Complaint  Patient presents with  . Diarrhea  . Dizziness   Patient is a 81 y.o. female presenting with diarrhea. The history is provided by the patient and a relative. No language interpreter was used.  Diarrhea Quality:  Black and tarry and semi-solid Onset quality:  Sudden Number of episodes:  3 Duration:  2 days Relieved by:  Nothing Worsened by:  Nothing tried Ineffective treatments:  None tried Associated symptoms: no abdominal pain, no chills, no recent cough, no diaphoresis, no fever, no headaches, no URI and no vomiting    HPI Comments: BURNELL MATLIN is a 81 y.o. female who presents to the Emergency Department complaining of an array of symptoms which all began yesterday. Patient with a history of vertigo reports dizziness and bilateral ear pain. Most concerningly patient complains of diarrhea and melena with a total of three loose stools yesterday. Patient's son accompanies her states that patient endorses a normal bowel movement Tuesday morning after waking and then development of incessant nausea. When asked patient denies true abdominal or rectal pain, but reports weakness, dizziness and tarry, coal like stools of which she's never had a history.     Past Medical History  Diagnosis Date  . Hypertension   . Hyperlipidemia     hypercholesterolemia  . CAD (coronary artery disease)   . CHF (congestive heart failure)    Past Surgical History  Procedure Laterality Date  . Cholecystectomy    . Appendectomy     No family history on file. History  Substance Use Topics  . Smoking status: Never Smoker   . Smokeless tobacco: Never Used  . Alcohol Use: No   OB History    No data available     Review of Systems  Constitutional: Negative  for fever, chills, diaphoresis, appetite change and fatigue.  HENT: Positive for ear pain. Negative for congestion, ear discharge, sinus pressure and sore throat.   Eyes: Negative for pain and discharge.  Respiratory: Negative for cough.   Cardiovascular: Negative for chest pain.  Gastrointestinal: Positive for nausea, diarrhea and blood in stool. Negative for vomiting, abdominal pain and rectal pain.  Genitourinary: Negative for dysuria, frequency and hematuria.  Musculoskeletal: Negative for back pain.  Skin: Negative for rash.  Allergic/Immunologic: Negative for immunocompromised state.  Neurological: Positive for dizziness. Negative for seizures and headaches.  Psychiatric/Behavioral: Negative for hallucinations.  All other systems reviewed and are negative.     Allergies  Review of patient's allergies indicates no known allergies.  Home Medications   Prior to Admission medications   Medication Sig Start Date End Date Taking? Authorizing Provider  ALPRAZolam Duanne Moron) 0.5 MG tablet Take 0.5 mg by mouth 2 (two) times daily.   Yes Historical Provider, MD  amLODipine (NORVASC) 2.5 MG tablet TAKE ONE TABLET BY MOUTH ONCE DAILY 10/22/14  Yes Lendon Colonel, NP  aspirin 81 MG tablet Take 81 mg by mouth daily.     Yes Historical Provider, MD  furosemide (LASIX) 40 MG tablet Take 1 tablet (40 mg total) by mouth daily. 07/21/14  Yes Herminio Commons, MD  naproxen sodium (ALEVE) 220 MG tablet Take 220 mg by mouth as needed (pain).    Yes Historical Provider, MD  Omega-3 Fatty Acids (FISH OIL) 1000 MG CAPS Take 1 capsule by mouth daily.     Yes Historical Provider, MD   Triage Vitals: BP 129/76 mmHg  Pulse 89  Temp(Src) 97.7 F (36.5 C) (Oral)  Resp 16  Ht 5' (1.524 m)  Wt 125 lb (56.7 kg)  BMI 24.41 kg/m2  SpO2 100% Physical Exam  Constitutional: She is oriented to person, place, and time. She appears well-developed.  HENT:  Head: Normocephalic.  Dry mucus membranes  Eyes:  Conjunctivae and EOM are normal. No scleral icterus.  Pale conjunctiva  Neck: Neck supple. No thyromegaly present.  Cardiovascular: Normal rate and regular rhythm.  Exam reveals no gallop and no friction rub.   No murmur heard. Pulmonary/Chest: No stridor. She has no wheezes. She has no rales. She exhibits no tenderness.  Abdominal: She exhibits no distension. There is no tenderness. There is no rebound.  Genitourinary: Rectum normal.  Dark brown stool was heme positive   Musculoskeletal: Normal range of motion. She exhibits no edema.  Severe arthritis   Lymphadenopathy:    She has no cervical adenopathy.  Neurological: She is oriented to person, place, and time. She exhibits normal muscle tone. Coordination normal.  Skin: No rash noted. No erythema.  Psychiatric: She has a normal mood and affect. Her behavior is normal.  Nursing note and vitals reviewed.   ED Course  Procedures (including critical care time)  COORDINATION OF CARE: 8:57 AM- Discussed treatment plan with patient at bedside and patient agreed to plan.   Labs Review Labs Reviewed - No data to display  Imaging Review No results found.   EKG Interpretation   Date/Time:  Thursday November 27 2014 08:36:36 EDT Ventricular Rate:  86 PR Interval:  34 QRS Duration: 68 QT Interval:  468 QTC Calculation: 560 R Axis:   28 Text Interpretation:  Sinus rhythm Short PR interval Low voltage,  precordial leads Borderline T abnormalities, diffuse leads Prolonged QT  interval Confirmed by [place]  MD, Myrle Wanek ([id number]) on 11/27/2014 10:48:59 AM     CRITICAL CARE Performed by: Oscar Forman L Total critical care time:40 Critical care time was exclusive of separately billable procedures and treating other patients. Critical care was necessary to treat or prevent imminent or life-threatening deterioration. Critical care was time spent personally by me on the following activities: development of treatment plan with patient and/or  surrogate as well as nursing, discussions with consultants, evaluation of patient's response to treatment, examination of patient, obtaining history from patient or surrogate, ordering and performing treatments and interventions, ordering and review of laboratory studies, ordering and review of radiographic studies, pulse oximetry and re-evaluation of patient's condition.   MDM   Final diagnoses:  None    The chart was scribed for me under my direct supervision.  I personally performed the history, physical, and medical decision making and all procedures in the evaluation of this patient.Milton Ferguson, MD 11/27/14 [id number]

## 2014-11-28 ENCOUNTER — Encounter (HOSPITAL_COMMUNITY): Payer: Self-pay | Admitting: *Deleted

## 2014-11-28 ENCOUNTER — Ambulatory Visit: Payer: Medicare Other | Admitting: Cardiovascular Disease

## 2014-11-28 ENCOUNTER — Encounter (HOSPITAL_COMMUNITY)
Admission: EM | Disposition: A | Discharge status: Home Health | Payer: Self-pay | Source: Home / Self Care | Attending: Pulmonary Disease

## 2014-11-28 DIAGNOSIS — K209 Esophagitis, unspecified: Secondary | ICD-10-CM

## 2014-11-28 DIAGNOSIS — K222 Esophageal obstruction: Secondary | ICD-10-CM

## 2014-11-28 DIAGNOSIS — K922 Gastrointestinal hemorrhage, unspecified: Secondary | ICD-10-CM | POA: Diagnosis present

## 2014-11-28 DIAGNOSIS — K921 Melena: Principal | ICD-10-CM

## 2014-11-28 HISTORY — PX: ESOPHAGEAL DILATION: SHX303

## 2014-11-28 HISTORY — PX: ESOPHAGOGASTRODUODENOSCOPY: SHX5428

## 2014-11-28 LAB — TYPE AND SCREEN
ABO/RH(D): O POS
Antibody Screen: NEGATIVE
UNIT DIVISION: 0
UNIT DIVISION: 0

## 2014-11-28 LAB — CBC
HCT: 24.9 % — ABNORMAL LOW (ref 36.0–46.0)
Hemoglobin: 8.2 g/dL — ABNORMAL LOW (ref 12.0–15.0)
MCH: 29.5 pg (ref 26.0–34.0)
MCHC: 32.9 g/dL (ref 30.0–36.0)
MCV: 89.6 fL (ref 78.0–100.0)
Platelets: 150 10*3/uL (ref 150–400)
RBC: 2.78 MIL/uL — ABNORMAL LOW (ref 3.87–5.11)
RDW: 14.8 % (ref 11.5–15.5)
WBC: 6.6 10*3/uL (ref 4.0–10.5)

## 2014-11-28 LAB — BASIC METABOLIC PANEL
Anion gap: 7 (ref 5–15)
BUN: 38 mg/dL — AB (ref 6–23)
CO2: 27 mmol/L (ref 19–32)
Calcium: 8.1 mg/dL — ABNORMAL LOW (ref 8.4–10.5)
Chloride: 108 mmol/L (ref 96–112)
Creatinine, Ser: 0.7 mg/dL (ref 0.50–1.10)
GFR calc non Af Amer: 71 mL/min — ABNORMAL LOW (ref >90)
GFR, EST AFRICAN AMERICAN: 82 mL/min — AB (ref >90)
GLUCOSE: 89 mg/dL (ref 70–99)
Potassium: 3.7 mmol/L (ref 3.5–5.1)
SODIUM: 142 mmol/L (ref 135–145)

## 2014-11-28 LAB — HEMOGLOBIN AND HEMATOCRIT, BLOOD
HEMATOCRIT: 27.2 % — AB (ref 36.0–46.0)
Hemoglobin: 9 g/dL — ABNORMAL LOW (ref 12.0–15.0)

## 2014-11-28 SURGERY — EGD (ESOPHAGOGASTRODUODENOSCOPY)
Anesthesia: Moderate Sedation

## 2014-11-28 MED ORDER — MIDAZOLAM HCL 5 MG/5ML IJ SOLN
INTRAMUSCULAR | Status: AC
Start: 2014-11-28 — End: 2014-11-29
  Filled 2014-11-28: qty 10

## 2014-11-28 MED ORDER — MIDAZOLAM HCL 5 MG/5ML IJ SOLN
INTRAMUSCULAR | Status: DC | PRN
  Administered 2014-11-28 (×5): 1 mg via INTRAVENOUS

## 2014-11-28 MED ORDER — STERILE WATER FOR IRRIGATION IR SOLN
Status: DC | PRN
  Administered 2014-11-28: 13:00:00

## 2014-11-28 MED ORDER — PANTOPRAZOLE SODIUM 40 MG PO TBEC
40.0000 mg | DELAYED_RELEASE_TABLET | Freq: Two times a day (BID) | ORAL | Status: DC
  Administered 2014-11-28 – 2014-11-29 (×2): 40 mg via ORAL
  Filled 2014-11-28 (×2): qty 1

## 2014-11-28 MED ORDER — ATROPINE SULFATE 1 MG/ML IJ SOLN
INTRAMUSCULAR | Status: DC | PRN
  Administered 2014-11-28: .5 mg via INTRAVENOUS

## 2014-11-28 MED ORDER — MINERAL OIL PO OIL
TOPICAL_OIL | ORAL | Status: AC
  Filled 2014-11-28: qty 30

## 2014-11-28 MED ORDER — LIDOCAINE VISCOUS 2 % MT SOLN
OROMUCOSAL | Status: AC
  Filled 2014-11-28: qty 15

## 2014-11-28 MED ORDER — LIDOCAINE VISCOUS 2 % MT SOLN
OROMUCOSAL | Status: DC | PRN
  Administered 2014-11-28: 4 mL via OROMUCOSAL

## 2014-11-28 MED ORDER — MEPERIDINE HCL 100 MG/ML IJ SOLN
INTRAMUSCULAR | Status: AC
  Filled 2014-11-28: qty 2

## 2014-11-28 MED ORDER — SODIUM CHLORIDE 0.9 % IV SOLN: 250.0000 mL | INTRAVENOUS | Status: DC | PRN

## 2014-11-28 MED ORDER — SODIUM CHLORIDE 0.9 % IV SOLN: 250.0000 mL | INTRAVENOUS | Status: AC | PRN

## 2014-11-28 MED ORDER — SODIUM CHLORIDE 0.9 % IV SOLN
INTRAVENOUS | Status: DC
Start: 2014-11-28 — End: 2014-11-28
  Administered 2014-11-28: 12:00:00 via INTRAVENOUS

## 2014-11-28 MED ORDER — MEPERIDINE HCL 100 MG/ML IJ SOLN
INTRAMUSCULAR | Status: DC | PRN
  Administered 2014-11-28: 25 mg via INTRAVENOUS

## 2014-11-28 MED ORDER — ATROPINE SULFATE 1 MG/ML IJ SOLN
INTRAMUSCULAR | Status: AC
  Filled 2014-11-28: qty 1

## 2014-11-28 NOTE — Op Note (Addendum)
[place], [id number]   ENDOSCOPY PROCEDURE REPORT  PATIENT: Susan, Glass  MR#: [id number] BIRTHDATE: [birth year] , 89  yrs. old GENDER: female  ENDOSCOPIST: Danie Binder, MD REFFERED NO:[facility] Luan Pulling, M.D.  [place]field Cornea, M.D. PROCEDURE DATE:  12/16/14 PROCEDURE:   EGD with biopsy and EGD with dilatation over guidewire   INDICATIONS:1.  dysphagia.   2.  melenic bleeding.   3.  anemia. MEDICATIONS: Demerol 25 mg IV, Versed 5 mg IV, and Atropine 0.5 mg IV TOPICAL ANESTHETIC: Viscous Xylocaine  DESCRIPTION OF PROCEDURE:   After the risks benefits and alternatives of the procedure were thoroughly explained, informed consent was obtained.  The EG-[id number] ([id number])  endoscope was introduced through the mouth and advanced to the second portion of the duodenum. The instrument was slowly withdrawn as the mucosa was carefully examined.  Prior to withdrawal of the scope, the guidwire was placed.  The esophagus was dilated successfully.  The patient was recovered in endoscopy and discharged home in satisfactory condition.   ESOPHAGUS: FRIABLE 3-4 cm segment in distal esophagus with erythema, edema, and ulcerations/exudate.  Cold biopsies(3) obtained & active bleeding noted.  CAUTERY APPLIED WITH 7 Fr GOLD PROBE(20W). HEMOSTASIS ACHEIVED.  STRICTURE AT [place].  DIAGNOSTIC GASTROSCOPE PASSED WITHOUT RESISTANCE.   STOMACH: A hiatal hernia was found in the gastric body, gastric fundus, and cardia.  A few cameron ulcers were found.  Multiple biopsies were performed using cold forceps.   Mild non-erosive gastritis (inflammation) was found in the gastric antrum.  Multiple biopsies were performed using cold forceps.   DUODENUM: The duodenal mucosa showed no abnormalities in the bulb and second portion of the duodenum.   Dilation was then performed at the gastroesphageal junction Dilator: Savary over guidewire Size(s): 12.8-16 MM Resistance:  moderate Heme: yes  COMPLICATIONS: s.  Atropine 0.5 mg iv given due to HR 40-45. HR INCREASED TO > 70 FOR REMAINDER OF EGD.  ENDOSCOPIC IMPRESSION: 1.   UGI BLEED DUE TO SEVERE DISTAL EROSIVE ESOPHAGITIS IN SETTING OF GE JXN STRICTURE 2.   LARGE SLIDING Hiatal hernia 3.   MILD Non-erosive gastritis  RECOMMENDATIONS: BID PPI FOR 3 MOS THEN DAILY FOREVER ADVANCE DIET AWAIT BIOPSY. OPV IN 3 MOS WITH DR.  Gala Romney. DISCUSSED WITH SON(STEVE HOPPER).   eSigned:  Danie Binder, MD 12/16/14 1:42 PM Revised: Dec 16, 2014 1:42 PM  CPT CODES: ICD CODES:  The ICD and CPT codes recommended by this software are interpretations from the data that the clinical staff has captured with the software.  The verification of the translation of this report to the ICD and CPT codes and modifiers is the sole responsibility of the health care institution and practicing physician where this report was generated.  [employer] will not be held responsible for the validity of the ICD and CPT codes included on this report.  AMA assumes no liability for data contained or not contained herein. CPT is a Designer, television/film set of the [employer].

## 2014-11-28 NOTE — Evaluation (Signed)
Physical Therapy Evaluation Patient Details Name: Susan Glass MRN: [id number] DOB: May 03, 1917 Today's Date: 11/28/2014   History of Present Illness  Pt is a 81 year old female who was admitted with a GI bleed and anemia.  She lives alone in an apartment and ambulates with a walker.  Her son lives nearby and assists as needed.  Pt is independent with dressing and food preparation but states that she has difficulty with bathing.  Clinical Impression   Pt was seen for evaluation following her EGD earlier today.  She is alert and oriented, able to follow all directions.  Pt is found to be close to her functional baseline.  She is able to transfer OOB with SBA and ambulate 20' with a walker.  She should be able to transition to home with HHPT.    Follow Up Recommendations Home health PT    Equipment Recommendations  None recommended by PT    Recommendations for Other Services   none    Precautions / Restrictions Precautions Precautions: Fall Restrictions Weight Bearing Restrictions: No      Mobility  Bed Mobility Overal bed mobility: Needs Assistance Bed Mobility: Supine to Sit     Supine to sit: Supervision        Transfers Overall transfer level: Modified independent Equipment used: Rolling walker (2 wheeled)                Ambulation/Gait Ambulation/Gait assistance: Supervision Ambulation Distance (Feet): 20 Feet Assistive device: Standard walker Gait Pattern/deviations: Trunk flexed   Gait velocity interpretation: at or above normal speed for age/gender    Stairs            Wheelchair Mobility    Modified Rankin (Stroke Patients Only)       Balance Overall balance assessment: No apparent balance deficits (not formally assessed)                                           Pertinent Vitals/Pain Pain Assessment: No/denies pain    Home Living Family/patient expects to be discharged to:: Private residence Living  Arrangements: Alone Available Help at Discharge: Family;Available PRN/intermittently Type of Home: Apartment Home Access: Level entry     Home Layout: One level Home Equipment: Walker - standard;Cane - single point Additional Comments: a w/c is available as needed    Prior Function Level of Independence: Independent with assistive device(s)               Hand Dominance        Extremity/Trunk Assessment   Upper Extremity Assessment: Generalized weakness           Lower Extremity Assessment: Generalized weakness      Cervical / Trunk Assessment: Kyphotic  Communication   Communication: No difficulties  Cognition Arousal/Alertness: Awake/alert Behavior During Therapy: WFL for tasks assessed/performed Overall Cognitive Status: Within Functional Limits for tasks assessed                      General Comments      Exercises        Assessment/Plan    PT Assessment Patient needs continued PT services  PT Diagnosis Difficulty walking;Generalized weakness   PT Problem List Decreased strength;Decreased activity tolerance;Decreased mobility;Cardiopulmonary status limiting activity  PT Treatment Interventions Gait training;Therapeutic exercise   PT Goals (Current goals can be found in the Care Plan  section) Acute Rehab PT Goals Patient Stated Goal: return home to independence PT Goal Formulation: With patient Time For Goal Achievement: 12/05/14 Potential to Achieve Goals: Good    Frequency Min 3X/week   Barriers to discharge   none    Co-evaluation               End of Session Equipment Utilized During Treatment: Gait belt (O2 sat=100% on room air) Activity Tolerance: Patient tolerated treatment well Patient left: in chair;with call bell/phone within reach;with chair alarm set Nurse Communication: Mobility status         Time: 4081-4481 PT Time Calculation (min) (ACUTE ONLY): 50 min   Charges:   PT Evaluation $Initial PT  Evaluation Tier I: 1 Procedure     PT G CodesDemetrios Isaacs L 11/28/2014, 4:14 PM

## 2014-11-28 NOTE — Care Management Note (Addendum)
    Page 1 of 2   11/28/2014     4:15:56 PM CARE MANAGEMENT NOTE 11/28/2014  Patient:  Susan Glass, Susan Glass   Account Number:  [id number]  Date Initiated:  11/28/2014  Documentation initiated by:  Theophilus Kinds  Subjective/Objective Assessment:   Pt admitted from home with gi bleed. Pt lives with alone and will return home at discharge. Pts son is very active in the care of the pt. Pt has a walker she uses and stated she is fairly independent with ADl's.     Action/Plan:   Awaiting PT eval. If pt needs home health services, weekend staff can arrange with agency of choice.   Anticipated DC Date:  11/30/2014   Anticipated DC Plan:  [place]  CM consult      [facility] Choice  HOME HEALTH   Choice offered to / List presented to:  C-1 Patient        [place] arranged  [place].   Status of service:  Completed, signed off Medicare Important Message given?  YES (If response is "NO", the following Medicare IM given date fields will be blank) Date Medicare IM given:  11/28/2014 Medicare IM given by:  Theophilus Kinds Date Additional Medicare IM given:   Additional Medicare IM given by:    Discharge Disposition:  [place]  Per UR Regulation:    If discussed at Long Length of Stay Meetings, dates discussed:    Comments:  11/28/14 [place], RN BSN CM PT recommends [facility] PT and aide at discharge. Pt choses [facility]. Romualdo Bolk of [facility] is aware and will collect the pts information from the chart. Hh services to start within 48 hours of discharge. No DME needs noted. Weekend staff to call and fax orders at discharge. Pt and pts nurse aware of discharge arrangements.  11/28/14 [place], RN BSN CM

## 2014-11-28 NOTE — H&P (Signed)
  Primary Care Physician:  Alonza Bogus, MD Primary Gastroenterologist:  Dr. Oneida Alar  Pre-Procedure History & Physical: HPI:  Susan Glass is a 81 y.o. female here for MELENA/DYSPHAGIA.  Past Medical History  Diagnosis Date  . Hypertension   . Hyperlipidemia     hypercholesterolemia  . CAD (coronary artery disease)   . CHF (congestive heart failure)     Past Surgical History  Procedure Laterality Date  . Cholecystectomy    . Appendectomy      Prior to Admission medications   Medication Sig Start Date End Date Taking? Authorizing Provider  ALPRAZolam Duanne Moron) 0.5 MG tablet Take 0.5 mg by mouth 2 (two) times daily.   Yes Historical Provider, MD  amLODipine (NORVASC) 2.5 MG tablet TAKE ONE TABLET BY MOUTH ONCE DAILY 10/22/14  Yes Lendon Colonel, NP  aspirin 81 MG tablet Take 81 mg by mouth daily.     Yes Historical Provider, MD  furosemide (LASIX) 40 MG tablet Take 1 tablet (40 mg total) by mouth daily. 07/21/14  Yes Herminio Commons, MD  naproxen sodium (ALEVE) 220 MG tablet Take 220 mg by mouth as needed (pain).    Yes Historical Provider, MD  Omega-3 Fatty Acids (FISH OIL) 1000 MG CAPS Take 1 capsule by mouth daily.     Yes Historical Provider, MD    Allergies as of 11/27/2014  . (No Known Allergies)    History reviewed. No pertinent family history.  History   Social History  . Marital Status: Married    Spouse Name: N/A  . Number of Children: N/A  . Years of Education: N/A   Occupational History  . Not on file.   Social History Main Topics  . Smoking status: Never Smoker   . Smokeless tobacco: Never Used  . Alcohol Use: No  . Drug Use: No  . Sexual Activity: Not on file   Other Topics Concern  . Not on file   Social History Narrative    Review of Systems: See HPI, otherwise negative ROS   Physical Exam: BP 119/56 mmHg  Pulse 68  Temp(Src) 98 F (36.7 C) (Oral)  Resp 17  Ht 5' (1.524 m)  Wt 125 lb (56.7 kg)  BMI 24.41 kg/m2  SpO2  94% General:   Alert,  pleasant and cooperative in NAD Head:  Normocephalic and atraumatic. Neck:  Supple; Lungs:  Clear throughout to auscultation.    Heart:  Regular rate and rhythm. Abdomen:  Soft, nontender and nondistended. Normal bowel sounds, without guarding, and without rebound.   Neurologic:  Alert and  oriented x4;  NO  NEW FOCAL DEFICITS   Impression/Plan:    MELENA/DYSPHAGIA  PLAN: 1. EGD/?DIL TODAY

## 2014-11-28 NOTE — Care Management Utilization Note (Signed)
UR completed 

## 2014-11-28 NOTE — Progress Notes (Signed)
Patient loss of IV access. Dr. Everette Rank notified and ok with no access as patient has no IV meds.

## 2014-11-28 NOTE — Progress Notes (Signed)
Subjective: She is generally doing better. She was admitted yesterday with GI bleeding. She has not noticed any more bleeding but her hemoglobin has dropped another gram  Objective: Vital signs in last 24 hours: Temp:  [97.6 F (36.4 C)-98.8 F (37.1 C)] 98 F (36.7 C) (04/15 0652) Pulse Rate:  [63-84] 72 (04/15 0652) Resp:  [12-20] 16 (04/15 0652) BP: (101-139)/(43-74) 124/62 mmHg (04/15 0652) SpO2:  [96 %-100 %] 96 % (04/15 0947) Weight change:  Last BM Date: 11/25/14  Intake/Output from previous day: 04/14 0701 - 04/15 0700 In: 709 [Blood:709] Out: -   PHYSICAL EXAM General appearance: alert, cooperative and mild distress Resp: clear to auscultation bilaterally Cardio: regular rate and rhythm, S1, S2 normal, no murmur, click, rub or gallop GI: soft, non-tender; bowel sounds normal; no masses,  no organomegaly Extremities: extremities normal, atraumatic, no cyanosis or edema  Lab Results:  Results for orders placed or performed during the hospital encounter of 11/27/14 (from the past 48 hour(s))  CBC with Differential/Platelet     Status: Abnormal   Collection Time: 11/27/14  8:42 AM  Result Value Ref Range   WBC 8.9 4.0 - 10.5 K/uL   RBC 2.15 (L) 3.87 - 5.11 MIL/uL   Hemoglobin 6.5 (LL) 12.0 - 15.0 g/dL    Comment: RESULT REPEATED AND VERIFIED CRITICAL RESULT CALLED TO, READ BACK BY AND VERIFIED WITH: JOAQNNA KEITH RN ON [id number] AT 0930 BY RESSEGGER R     HCT 19.7 (L) 36.0 - 46.0 %   MCV 91.6 78.0 - 100.0 fL   MCH 30.2 26.0 - 34.0 pg   MCHC 33.0 30.0 - 36.0 g/dL   RDW 13.7 11.5 - 15.5 %   Platelets 202 150 - 400 K/uL   Neutrophils Relative % 70 43 - 77 %   Neutro Abs 6.3 1.7 - 7.7 K/uL   Lymphocytes Relative 23 12 - 46 %   Lymphs Abs 2.1 0.7 - 4.0 K/uL   Monocytes Relative 6 3 - 12 %   Monocytes Absolute 0.6 0.1 - 1.0 K/uL   Eosinophils Relative 0 0 - 5 %   Eosinophils Absolute 0.0 0.0 - 0.7 K/uL   Basophils Relative 0 0 - 1 %   Basophils Absolute 0.0 0.0 -  0.1 K/uL  Comprehensive metabolic panel     Status: Abnormal   Collection Time: 11/27/14  8:42 AM  Result Value Ref Range   Sodium 139 135 - 145 mmol/L   Potassium 4.3 3.5 - 5.1 mmol/L   Chloride 106 96 - 112 mmol/L   CO2 24 19 - 32 mmol/L   Glucose, Bld 122 (H) 70 - 99 mg/dL   BUN 64 (H) 6 - 23 mg/dL   Creatinine, Ser 0.79 0.50 - 1.10 mg/dL   Calcium 8.6 8.4 - 10.5 mg/dL   Total Protein 5.7 (L) 6.0 - 8.3 g/dL   Albumin 3.4 (L) 3.5 - 5.2 g/dL   AST 18 0 - 37 U/L   ALT 10 0 - 35 U/L   Alkaline Phosphatase 45 39 - 117 U/L   Total Bilirubin 0.4 0.3 - 1.2 mg/dL   GFR calc non Af Amer 68 (L) >90 mL/min   GFR calc Af Amer 79 (L) >90 mL/min    Comment: (NOTE) The eGFR has been calculated using the CKD EPI equation. This calculation has not been validated in all clinical situations. eGFR's persistently <90 mL/min signify possible Chronic Kidney Disease.    Anion gap 9 5 - 15  Type  and screen     Status: None   Collection Time: 11/27/14  8:42 AM  Result Value Ref Range   ABO/RH(D) O POS    Antibody Screen NEG    Sample Expiration 11/30/2014    Unit Number [id number]    Blood Component Type RED CELLS,LR    Unit division 00    Status of Unit ISSUED,FINAL    Transfusion Status OK TO TRANSFUSE    Crossmatch Result Compatible    Unit Number [id number]    Blood Component Type RED CELLS,LR    Unit division 00    Status of Unit ISSUED,FINAL    Transfusion Status OK TO TRANSFUSE    Crossmatch Result Compatible   ABO/Rh     Status: None   Collection Time: 11/27/14  8:42 AM  Result Value Ref Range   ABO/RH(D) O POS   POC occult blood, ED     Status: Abnormal   Collection Time: 11/27/14  8:53 AM  Result Value Ref Range   Fecal Occult Bld POSITIVE (A) NEGATIVE  Prepare RBC     Status: None   Collection Time: 11/27/14  9:32 AM  Result Value Ref Range   Order Confirmation ORDER PROCESSED BY BLOOD BANK   Vitamin B12     Status: Abnormal   Collection Time: 11/27/14 10:56 AM   Result Value Ref Range   Vitamin B-12 136 (L) 211 - 911 pg/mL    Comment: Performed at [facility]  Folate     Status: None   Collection Time: 11/27/14 10:56 AM  Result Value Ref Range   Folate 12.4 ng/mL    Comment: (NOTE) Reference Ranges        Deficient:       0.4 - 3.3 ng/mL        Indeterminate:   3.4 - 5.4 ng/mL        Normal:              > 5.4 ng/mL Performed at [facility]   Iron and TIBC     Status: Abnormal   Collection Time: 11/27/14 10:56 AM  Result Value Ref Range   Iron 20 (L) 42 - 145 ug/dL   TIBC 316 250 - 470 ug/dL   Saturation Ratios 6 (L) 20 - 55 %   UIBC 296 125 - 400 ug/dL    Comment: Performed at [facility]  Ferritin     Status: None   Collection Time: 11/27/14 10:56 AM  Result Value Ref Range   Ferritin 12 10 - 291 ng/mL    Comment: Performed at [facility]  Reticulocytes     Status: Abnormal   Collection Time: 11/27/14 10:56 AM  Result Value Ref Range   Retic Ct Pct 3.1 0.4 - 3.1 %   RBC. 1.98 (L) 3.87 - 5.11 MIL/uL   Retic Count, Manual 61.4 19.0 - 186.0 K/uL  CBC     Status: Abnormal   Collection Time: 11/27/14  8:04 PM  Result Value Ref Range   WBC 12.5 (H) 4.0 - 10.5 K/uL   RBC 3.05 (L) 3.87 - 5.11 MIL/uL   Hemoglobin 9.2 (L) 12.0 - 15.0 g/dL    Comment: DELTA CHECK NOTED POST TRANSFUSION SPECIMEN    HCT 27.1 (L) 36.0 - 46.0 %   MCV 88.9 78.0 - 100.0 fL   MCH 30.2 26.0 - 34.0 pg   MCHC 33.9 30.0 - 36.0 g/dL   RDW 14.4 11.5 - 15.5 %  Platelets 157 150 - 400 K/uL  Basic metabolic panel     Status: Abnormal   Collection Time: 11/28/14  7:17 AM  Result Value Ref Range   Sodium 142 135 - 145 mmol/L   Potassium 3.7 3.5 - 5.1 mmol/L   Chloride 108 96 - 112 mmol/L   CO2 27 19 - 32 mmol/L   Glucose, Bld 89 70 - 99 mg/dL   BUN 38 (H) 6 - 23 mg/dL    Comment: DELTA CHECK NOTED   Creatinine, Ser 0.70 0.50 - 1.10 mg/dL   Calcium 8.1 (L) 8.4 - 10.5 mg/dL   GFR calc non Af Amer 71 (L) >90 mL/min   GFR  calc Af Amer 82 (L) >90 mL/min    Comment: (NOTE) The eGFR has been calculated using the CKD EPI equation. This calculation has not been validated in all clinical situations. eGFR's persistently <90 mL/min signify possible Chronic Kidney Disease.    Anion gap 7 5 - 15  CBC     Status: Abnormal   Collection Time: 11/28/14  7:17 AM  Result Value Ref Range   WBC 6.6 4.0 - 10.5 K/uL   RBC 2.78 (L) 3.87 - 5.11 MIL/uL   Hemoglobin 8.2 (L) 12.0 - 15.0 g/dL   HCT 24.9 (L) 36.0 - 46.0 %   MCV 89.6 78.0 - 100.0 fL   MCH 29.5 26.0 - 34.0 pg   MCHC 32.9 30.0 - 36.0 g/dL   RDW 14.8 11.5 - 15.5 %   Platelets 150 150 - 400 K/uL    ABGS No results for input(s): PHART, PO2ART, TCO2, HCO3 in the last 72 hours.  Invalid input(s): PCO2 CULTURES No results found for this or any previous visit (from the past 240 hour(s)). Studies/Results: No results found.  Medications:  Prior to Admission:  Prescriptions prior to admission  Medication Sig Dispense Refill Last Dose  . ALPRAZolam (XANAX) 0.5 MG tablet Take 0.5 mg by mouth 2 (two) times daily.   11/26/2014 at Unknown time  . amLODipine (NORVASC) 2.5 MG tablet TAKE ONE TABLET BY MOUTH ONCE DAILY 30 tablet 3 11/26/2014 at Unknown time  . aspirin 81 MG tablet Take 81 mg by mouth daily.     11/26/2014 at Unknown time  . furosemide (LASIX) 40 MG tablet Take 1 tablet (40 mg total) by mouth daily. 30 tablet 11 11/26/2014 at Unknown time  . naproxen sodium (ALEVE) 220 MG tablet Take 220 mg by mouth as needed (pain).    Past Week at Unknown time  . Omega-3 Fatty Acids (FISH OIL) 1000 MG CAPS Take 1 capsule by mouth daily.     11/26/2014 at Unknown time   Scheduled: . sodium chloride   Intravenous Once  . ALPRAZolam  0.5 mg Oral BID  . pantoprazole (PROTONIX) IV  40 mg Intravenous Q12H  . sodium chloride  3 mL Intravenous Q12H   Continuous:  [id number]:[id number] chloride, acetaminophen **OR** acetaminophen, bisacodyl, HYDROcodone-acetaminophen, ondansetron **OR**  ondansetron (ZOFRAN) IV, sodium chloride, traZODone  Assesment: She has GI bleeding. She set for EGD today. Her hemoglobin level has dropped a little more salt check that again later she may need another unit of blood. Principal Problem:   Weakness Active Problems:   Essential hypertension   COMBINED HEART FAILURE, CHRONIC   GI bleed   Acute blood loss anemia   Melena    Plan: Continue current treatments. EGD today and check hemoglobin    LOS: 1 day   Beya Tipps L 11/28/2014, 9:02 AM

## 2014-11-28 NOTE — Discharge Instructions (Signed)
YOU HAVE ESOPHAGITIS. I STRETCHED your esophagus DUE TO A STRICTURE AT THE BOTTOM OF YOUR ESOPHAGUS. You have a LARGE hiatal hernia, STOMACH EROSIONS, AND gastritis. I biopsied your stomach AND ESOPHAGUS.   CONTINUE PROTONIX. TAKE 30 MINUTES PRIOR TO MEALS TWICE DAILY FOR 3 MOS THEN ONCE DAILY FOREVER.  YOUR BIOPSY RESULTS WILL BE AVAILABLE IN MY CHART AFTER APR 19 AND MY OFFICE WILL CONTACT YOU IN 10-14 DAYS WITH YOUR RESULTS.   FOLLOW UP APPT IN 3 MONTHS WITH DR. Gala Romney.    UPPER ENDOSCOPY AFTER CARE Read the instructions outlined below and refer to this sheet in the next week. These discharge instructions provide you with general information on caring for yourself after you leave the hospital. While your treatment has been planned according to the most current medical practices available, unavoidable complications occasionally occur. If you have any problems or questions after discharge, call DR. Saathvik Every, [phone].  ACTIVITY  You may resume your regular activity, but move at a slower pace for the next 24 hours.   Take frequent rest periods for the next 24 hours.   Walking will help get rid of the air and reduce the bloated feeling in your belly (abdomen).   No driving for 24 hours (because of the medicine (anesthesia) used during the test).   You may shower.   Do not sign any important legal documents or operate any machinery for 24 hours (because of the anesthesia used during the test).    NUTRITION  Drink plenty of fluids.   You may resume your normal diet as instructed by your doctor.   Begin with a light meal and progress to your normal diet. Heavy or fried foods are harder to digest and may make you feel sick to your stomach (nauseated).   Avoid alcoholic beverages for 24 hours or as instructed.    MEDICATIONS  You may resume your normal medications.   WHAT YOU CAN EXPECT TODAY  Some feelings of bloating in the abdomen.   Passage of more gas than usual.     IF YOU HAD A BIOPSY TAKEN DURING THE UPPER ENDOSCOPY:  Eat a soft diet IF YOU HAVE NAUSEA, BLOATING, ABDOMINAL PAIN, OR VOMITING.    FINDING OUT THE RESULTS OF YOUR TEST Not all test results are available during your visit. DR. Oneida Alar WILL CALL YOU WITHIN 14 DAYS OF YOUR PROCEDUE WITH YOUR RESULTS. Do not assume everything is normal if you have not heard from DR. Giancarlo Askren, CALL HER OFFICE AT [phone].  SEEK IMMEDIATE MEDICAL ATTENTION AND CALL THE OFFICE: [phone] IF:  You have more than a spotting of blood in your stool.   Your belly is swollen (abdominal distention).   You are nauseated or vomiting.   You have a temperature over 101F.   You have abdominal pain or discomfort that is severe or gets worse throughout the day.   Gastritis  Gastritis is an inflammation (the body's way of reacting to injury and/or infection) of the stomach. It is often caused by bacterial (germ) infections. It can also be caused BY ASPIRIN, BC/GOODY POWDER'S, (IBUPROFEN) MOTRIN, OR ALEVE (NAPROXEN), chemicals (including alcohol), SPICY FOODS, and medications. This illness may be associated with generalized malaise (feeling tired, not well), UPPER ABDOMINAL STOMACH cramps, and fever. One common bacterial cause of gastritis is an organism known as H. Pylori. This can be treated with antibiotics.   Hiatal Hernia A hiatal hernia occurs when a part of the stomach slides above the diaphragm. The diaphragm is  the thin muscle separating the belly (abdomen) from the chest. A hiatal hernia can be something you are born with or develop over time. Hiatal hernias may allow stomach acid to flow back into your esophagus, the tube which carries food from your mouth to your stomach. If this acid causes problems it is called GERD (gastro-esophageal reflux disease).   SYMPTOMS Common symptoms of GERD are heartburn (burning in your chest). This is worse when lying down or bending over. It may also cause belching and  indigestion. Some of the things which make GERD worse are:  HOME CARE INSTRUCTIONS  Do not wear tight clothing around your chest or stomach.   Eat smaller meals and eat more frequently. This keeps your stomach from getting too full. Eat slowly.   Do not lie down for 2 or 3 hours after eating. Do not eat or drink anything 1 to 2 hours before going to bed.   Avoid caffeine beverages (colas, coffee, cocoa, tea), fatty foods, citrus fruits and all other foods and drinks that contain acid and that seem to increase the problems.   Avoid bending over OR STRAINING, especially after eating. Anything that increases the pressure in your belly increases the amount of acid that may be pushed up into your esophagus.    ESOPHAGEAL STRICTURE  Esophageal strictures can be caused by stomach acid backing up into the tube that carries food from the mouth down to the stomach (lower esophagus).  TREATMENT There are a number of medicines used to treat reflux/stricture, including: Antacids.  ZANTAC Proton-pump inhibitors: PROTONIX  HOME CARE INSTRUCTIONS Eat 2-3 hours before going to bed.  Try to reach and maintain a healthy weight.  Do not eat just a few very large meals. Instead, eat 4 TO 6 smaller meals throughout the day.  Try to identify foods and beverages that make your symptoms worse, and avoid these.  Avoid tight clothing.  Do not exercise right after eating.

## 2014-11-29 DIAGNOSIS — K2970 Gastritis, unspecified, without bleeding: Secondary | ICD-10-CM | POA: Diagnosis present

## 2014-11-29 DIAGNOSIS — K209 Esophagitis, unspecified without bleeding: Secondary | ICD-10-CM | POA: Diagnosis present

## 2014-11-29 DIAGNOSIS — K2990 Gastroduodenitis, unspecified, without bleeding: Secondary | ICD-10-CM

## 2014-11-29 MED ORDER — FUROSEMIDE 40 MG PO TABS: 40.0000 mg | ORAL_TABLET | Freq: Every day | ORAL | Status: DC

## 2014-11-29 MED ORDER — FERROUS SULFATE 325 (65 FE) MG PO TABS: 325.0000 mg | ORAL_TABLET | Freq: Every day | ORAL | Status: AC

## 2014-11-29 MED ORDER — PANTOPRAZOLE SODIUM 40 MG PO TBEC: 40.0000 mg | DELAYED_RELEASE_TABLET | Freq: Two times a day (BID) | ORAL | Status: DC

## 2014-11-29 NOTE — Discharge Summary (Signed)
Physician Discharge Summary  Patient ID: Susan Glass MRN: [id number] DOB/AGE: 03-20-1917 82 y.o. Primary Care Physician:Hosie Sharman L, MD Admit date: 11/27/2014 Discharge date: 11/29/2014    Discharge Diagnoses:   Principal Problem:   Weakness Active Problems:   Essential hypertension   COMBINED HEART FAILURE, CHRONIC   GI bleed   Acute blood loss anemia   Melena   Upper GI bleed   Acute esophagitis   Gastritis and gastroduodenitis     Medication List    STOP taking these medications        ALEVE 220 MG tablet  Generic drug:  naproxen sodium     aspirin 81 MG tablet      TAKE these medications        ALPRAZolam 0.5 MG tablet  Commonly known as:  XANAX  Take 0.5 mg by mouth 2 (two) times daily.     amLODipine 2.5 MG tablet  Commonly known as:  NORVASC  TAKE ONE TABLET BY MOUTH ONCE DAILY     ferrous sulfate 325 (65 FE) MG tablet  Commonly known as:  FERROUSUL  Take 1 tablet (325 mg total) by mouth daily with breakfast.     Fish Oil 1000 MG Caps  Take 1 capsule by mouth daily.     furosemide 40 MG tablet  Commonly known as:  LASIX  Take 1 tablet (40 mg total) by mouth daily.     pantoprazole 40 MG tablet  Commonly known as:  PROTONIX  Take 1 tablet (40 mg total) by mouth 2 (two) times daily before a meal.        Discharged Condition: Improved    Consults: GI  Significant Diagnostic Studies: No results found.  Lab Results: Basic Metabolic Panel:  Recent Labs  11/27/14 0842 11/28/14 0717  NA 139 142  K 4.3 3.7  CL 106 108  CO2 24 27  GLUCOSE 122* 89  BUN 64* 38*  CREATININE 0.79 0.70  CALCIUM 8.6 8.1*   Liver Function Tests:  Recent Labs  11/27/14 0842  AST 18  ALT 10  ALKPHOS 45  BILITOT 0.4  PROT 5.7*  ALBUMIN 3.4*     CBC:  Recent Labs  11/27/14 0842 11/27/14 2004 11/28/14 0717 11/28/14 1914  WBC 8.9 12.5* 6.6  --   NEUTROABS 6.3  --   --   --   HGB 6.5* 9.2* 8.2* 9.0*  HCT 19.7* 27.1* 24.9* 27.2*  MCV  91.6 88.9 89.6  --   PLT 202 157 150  --     No results found for this or any previous visit (from the past 240 hour(s)).   Hospital Course: This is a 81 year old who came to the emergency department with bleeding. She had several episodes of dark blood. She was noted to have hemoglobin level that was in the 6 range. She received 2 units of packed red blood cells and had EGD. She also had dilatation of her esophagus. She had biopsies are pending but appeared to have severe erosive esophagitis and had gastritis. Her hemoglobin stabilized and was at 9 on the day of discharge. She had no complaints and felt that she could go home.  Discharge Exam: Blood pressure 136/53, pulse 66, temperature 97.8 F (36.6 C), temperature source Oral, resp. rate 18, height 5' (1.524 m), weight 56.7 kg (125 lb), SpO2 100 %. She is awake and alert. No acute distress. Her chest is clear.  Disposition: Home with home health services      Discharge  Instructions    Discharge patient    Complete by:  As directed      Face-to-face encounter (required for Medicare/Medicaid patients)    Complete by:  As directed   I Jeliyah Middlebrooks L certify that this patient is under my care and that I, or a nurse practitioner or physician's assistant working with me, had a face-to-face encounter that meets the physician face-to-face encounter requirements with this patient on 11/29/2014. The encounter with the patient was in whole, or in part for the following medical condition(s) which is the primary reason for home health care (List medical condition): GI bleeding  The encounter with the patient was in whole, or in part, for the following medical condition, which is the primary reason for home health care:  GI bleeding  I certify that, based on my findings, the following services are medically necessary home health services:  Nursing  Reason for Medically Necessary Home Health Services:  Skilled Nursing- Change/Decline in Patient Status   My clinical findings support the need for the above services:  Unable to leave home safely without assistance and/or assistive device  Further, I certify that my clinical findings support that this patient is homebound due to:  Unable to leave home safely without assistance     Home Health    Complete by:  As directed   To provide the following care/treatments:   [place]             Follow-up Information    Follow up with [facility].   Contact information:   [address] [place] [address] [phone]       Signed: Alonza Bogus   11/29/2014, 9:40 AM

## 2014-11-29 NOTE — Progress Notes (Signed)
Pt did not have IV access. Discharge instructions and medications reviewed and discussed with patient's family. Follow up appointments reviewed and discussed with patient's family. All questions were answered and no further questions at this time. Pt in stable condition and in no apparent distress. Pt escorted by nurse.

## 2014-11-29 NOTE — Progress Notes (Signed)
Subjective: She is feeling very well. She has no new complaints. She lost IV access. Her hemoglobin level has come up some.  Objective: Vital signs in last 24 hours: Temp:  [97.8 F (36.6 C)-98 F (36.7 C)] 97.8 F (36.6 C) (04/16 0441) Pulse Rate:  [62-78] 66 (04/16 0441) Resp:  [13-22] 18 (04/16 0441) BP: (84-144)/(36-61) 136/53 mmHg (04/16 0441) SpO2:  [94 %-100 %] 100 % (04/16 0441) Weight change:  Last BM Date: 11/28/14  Intake/Output from previous day: 04/15 0701 - 04/16 0700 In: 700 [I.V.:700] Out: 550 [Urine:550]  PHYSICAL EXAM General appearance: alert, cooperative and no distress Resp: clear to auscultation bilaterally Cardio: regular rate and rhythm, S1, S2 normal, no murmur, click, rub or gallop GI: soft, non-tender; bowel sounds normal; no masses,  no organomegaly Extremities: extremities normal, atraumatic, no cyanosis or edema  Lab Results:  Results for orders placed or performed during the hospital encounter of 11/27/14 (from the past 48 hour(s))  Vitamin B12     Status: Abnormal   Collection Time: 11/27/14 10:56 AM  Result Value Ref Range   Vitamin B-12 136 (L) 211 - 911 pg/mL    Comment: Performed at [facility]  Folate     Status: None   Collection Time: 11/27/14 10:56 AM  Result Value Ref Range   Folate 12.4 ng/mL    Comment: (NOTE) Reference Ranges        Deficient:       0.4 - 3.3 ng/mL        Indeterminate:   3.4 - 5.4 ng/mL        Normal:              > 5.4 ng/mL Performed at [facility]   Iron and TIBC     Status: Abnormal   Collection Time: 11/27/14 10:56 AM  Result Value Ref Range   Iron 20 (L) 42 - 145 ug/dL   TIBC 316 250 - 470 ug/dL   Saturation Ratios 6 (L) 20 - 55 %   UIBC 296 125 - 400 ug/dL    Comment: Performed at [facility]  Ferritin     Status: None   Collection Time: 11/27/14 10:56 AM  Result Value Ref Range   Ferritin 12 10 - 291 ng/mL    Comment: Performed at [facility]   Reticulocytes     Status: Abnormal   Collection Time: 11/27/14 10:56 AM  Result Value Ref Range   Retic Ct Pct 3.1 0.4 - 3.1 %   RBC. 1.98 (L) 3.87 - 5.11 MIL/uL   Retic Count, Manual 61.4 19.0 - 186.0 K/uL  CBC     Status: Abnormal   Collection Time: 11/27/14  8:04 PM  Result Value Ref Range   WBC 12.5 (H) 4.0 - 10.5 K/uL   RBC 3.05 (L) 3.87 - 5.11 MIL/uL   Hemoglobin 9.2 (L) 12.0 - 15.0 g/dL    Comment: DELTA CHECK NOTED POST TRANSFUSION SPECIMEN    HCT 27.1 (L) 36.0 - 46.0 %   MCV 88.9 78.0 - 100.0 fL   MCH 30.2 26.0 - 34.0 pg   MCHC 33.9 30.0 - 36.0 g/dL   RDW 14.4 11.5 - 15.5 %   Platelets 157 150 - 400 K/uL  Basic metabolic panel     Status: Abnormal   Collection Time: 11/28/14  7:17 AM  Result Value Ref Range   Sodium 142 135 - 145 mmol/L   Potassium 3.7 3.5 - 5.1 mmol/L  Chloride 108 96 - 112 mmol/L   CO2 27 19 - 32 mmol/L   Glucose, Bld 89 70 - 99 mg/dL   BUN 38 (H) 6 - 23 mg/dL    Comment: DELTA CHECK NOTED   Creatinine, Ser 0.70 0.50 - 1.10 mg/dL   Calcium 8.1 (L) 8.4 - 10.5 mg/dL   GFR calc non Af Amer 71 (L) >90 mL/min   GFR calc Af Amer 82 (L) >90 mL/min    Comment: (NOTE) The eGFR has been calculated using the CKD EPI equation. This calculation has not been validated in all clinical situations. eGFR's persistently <90 mL/min signify possible Chronic Kidney Disease.    Anion gap 7 5 - 15  CBC     Status: Abnormal   Collection Time: 11/28/14  7:17 AM  Result Value Ref Range   WBC 6.6 4.0 - 10.5 K/uL   RBC 2.78 (L) 3.87 - 5.11 MIL/uL   Hemoglobin 8.2 (L) 12.0 - 15.0 g/dL   HCT 24.9 (L) 36.0 - 46.0 %   MCV 89.6 78.0 - 100.0 fL   MCH 29.5 26.0 - 34.0 pg   MCHC 32.9 30.0 - 36.0 g/dL   RDW 14.8 11.5 - 15.5 %   Platelets 150 150 - 400 K/uL  Hemoglobin and hematocrit, blood     Status: Abnormal   Collection Time: 11/28/14  7:14 PM  Result Value Ref Range   Hemoglobin 9.0 (L) 12.0 - 15.0 g/dL   HCT 27.2 (L) 36.0 - 46.0 %    ABGS No results for  input(s): PHART, PO2ART, TCO2, HCO3 in the last 72 hours.  Invalid input(s): PCO2 CULTURES No results found for this or any previous visit (from the past 240 hour(s)). Studies/Results: No results found.  Medications:  Prior to Admission:  Prescriptions prior to admission  Medication Sig Dispense Refill Last Dose  . ALPRAZolam (XANAX) 0.5 MG tablet Take 0.5 mg by mouth 2 (two) times daily.   11/26/2014 at Unknown time  . amLODipine (NORVASC) 2.5 MG tablet TAKE ONE TABLET BY MOUTH ONCE DAILY 30 tablet 3 11/26/2014 at Unknown time  . aspirin 81 MG tablet Take 81 mg by mouth daily.     11/26/2014 at Unknown time  . naproxen sodium (ALEVE) 220 MG tablet Take 220 mg by mouth as needed (pain).    Past Week at Unknown time  . Omega-3 Fatty Acids (FISH OIL) 1000 MG CAPS Take 1 capsule by mouth daily.     11/26/2014 at Unknown time  . [DISCONTINUED] furosemide (LASIX) 40 MG tablet Take 1 tablet (40 mg total) by mouth daily. 30 tablet 11 11/26/2014 at Unknown time   Scheduled: . sodium chloride   Intravenous Once  . ALPRAZolam  0.5 mg Oral BID  . pantoprazole  40 mg Oral BID AC  . sodium chloride  3 mL Intravenous Q12H   Continuous:  IEP:PIRJJOACZYSAY **OR** acetaminophen, bisacodyl, HYDROcodone-acetaminophen, ondansetron **OR** ondansetron (ZOFRAN) IV, sodium chloride, traZODone  Assesment: She had GI bleeding and acute blood loss anemia. She is much improved. She wants to go home. I think that's appropriate. Principal Problem:   Weakness Active Problems:   Essential hypertension   COMBINED HEART FAILURE, CHRONIC   GI bleed   Acute blood loss anemia   Melena   Upper GI bleed    Plan: Discharge home today with home health services    LOS: 2 days   Etter Royall L 11/29/2014, 9:36 AM

## 2014-12-01 ENCOUNTER — Encounter (HOSPITAL_COMMUNITY): Payer: Self-pay | Admitting: Gastroenterology

## 2014-12-02 DIAGNOSIS — K2991 Gastroduodenitis, unspecified, with bleeding: Secondary | ICD-10-CM | POA: Diagnosis not present

## 2014-12-02 DIAGNOSIS — K2901 Acute gastritis with bleeding: Secondary | ICD-10-CM | POA: Diagnosis not present

## 2014-12-02 DIAGNOSIS — K200 Eosinophilic esophagitis: Secondary | ICD-10-CM | POA: Diagnosis not present

## 2014-12-02 DIAGNOSIS — K209 Esophagitis, unspecified: Secondary | ICD-10-CM | POA: Diagnosis not present

## 2014-12-02 DIAGNOSIS — Z5181 Encounter for therapeutic drug level monitoring: Secondary | ICD-10-CM | POA: Diagnosis not present

## 2014-12-02 DIAGNOSIS — I5042 Chronic combined systolic (congestive) and diastolic (congestive) heart failure: Secondary | ICD-10-CM | POA: Diagnosis not present

## 2014-12-02 DIAGNOSIS — I10 Essential (primary) hypertension: Secondary | ICD-10-CM | POA: Diagnosis not present

## 2014-12-02 DIAGNOSIS — I2510 Atherosclerotic heart disease of native coronary artery without angina pectoris: Secondary | ICD-10-CM | POA: Diagnosis not present

## 2014-12-04 DIAGNOSIS — I5042 Chronic combined systolic (congestive) and diastolic (congestive) heart failure: Secondary | ICD-10-CM | POA: Diagnosis not present

## 2014-12-04 DIAGNOSIS — K2901 Acute gastritis with bleeding: Secondary | ICD-10-CM | POA: Diagnosis not present

## 2014-12-04 DIAGNOSIS — I2510 Atherosclerotic heart disease of native coronary artery without angina pectoris: Secondary | ICD-10-CM | POA: Diagnosis not present

## 2014-12-04 DIAGNOSIS — K2991 Gastroduodenitis, unspecified, with bleeding: Secondary | ICD-10-CM | POA: Diagnosis not present

## 2014-12-04 DIAGNOSIS — K209 Esophagitis, unspecified: Secondary | ICD-10-CM | POA: Diagnosis not present

## 2014-12-04 DIAGNOSIS — I10 Essential (primary) hypertension: Secondary | ICD-10-CM | POA: Diagnosis not present

## 2014-12-04 DIAGNOSIS — D599 Acquired hemolytic anemia, unspecified: Secondary | ICD-10-CM | POA: Diagnosis not present

## 2014-12-05 DIAGNOSIS — I5042 Chronic combined systolic (congestive) and diastolic (congestive) heart failure: Secondary | ICD-10-CM | POA: Diagnosis not present

## 2014-12-05 DIAGNOSIS — I2510 Atherosclerotic heart disease of native coronary artery without angina pectoris: Secondary | ICD-10-CM | POA: Diagnosis not present

## 2014-12-05 DIAGNOSIS — K209 Esophagitis, unspecified: Secondary | ICD-10-CM | POA: Diagnosis not present

## 2014-12-05 DIAGNOSIS — K2991 Gastroduodenitis, unspecified, with bleeding: Secondary | ICD-10-CM | POA: Diagnosis not present

## 2014-12-05 DIAGNOSIS — I10 Essential (primary) hypertension: Secondary | ICD-10-CM | POA: Diagnosis not present

## 2014-12-05 DIAGNOSIS — K2901 Acute gastritis with bleeding: Secondary | ICD-10-CM | POA: Diagnosis not present

## 2014-12-08 DIAGNOSIS — K2901 Acute gastritis with bleeding: Secondary | ICD-10-CM | POA: Diagnosis not present

## 2014-12-08 DIAGNOSIS — K2991 Gastroduodenitis, unspecified, with bleeding: Secondary | ICD-10-CM | POA: Diagnosis not present

## 2014-12-08 DIAGNOSIS — I5042 Chronic combined systolic (congestive) and diastolic (congestive) heart failure: Secondary | ICD-10-CM | POA: Diagnosis not present

## 2014-12-08 DIAGNOSIS — I2510 Atherosclerotic heart disease of native coronary artery without angina pectoris: Secondary | ICD-10-CM | POA: Diagnosis not present

## 2014-12-08 DIAGNOSIS — K209 Esophagitis, unspecified: Secondary | ICD-10-CM | POA: Diagnosis not present

## 2014-12-08 DIAGNOSIS — I10 Essential (primary) hypertension: Secondary | ICD-10-CM | POA: Diagnosis not present

## 2014-12-09 DIAGNOSIS — I2510 Atherosclerotic heart disease of native coronary artery without angina pectoris: Secondary | ICD-10-CM | POA: Diagnosis not present

## 2014-12-09 DIAGNOSIS — K2901 Acute gastritis with bleeding: Secondary | ICD-10-CM | POA: Diagnosis not present

## 2014-12-09 DIAGNOSIS — I10 Essential (primary) hypertension: Secondary | ICD-10-CM | POA: Diagnosis not present

## 2014-12-09 DIAGNOSIS — I5042 Chronic combined systolic (congestive) and diastolic (congestive) heart failure: Secondary | ICD-10-CM | POA: Diagnosis not present

## 2014-12-09 DIAGNOSIS — K209 Esophagitis, unspecified: Secondary | ICD-10-CM | POA: Diagnosis not present

## 2014-12-09 DIAGNOSIS — K2991 Gastroduodenitis, unspecified, with bleeding: Secondary | ICD-10-CM | POA: Diagnosis not present

## 2014-12-10 DIAGNOSIS — D649 Anemia, unspecified: Secondary | ICD-10-CM | POA: Diagnosis not present

## 2014-12-10 DIAGNOSIS — I5042 Chronic combined systolic (congestive) and diastolic (congestive) heart failure: Secondary | ICD-10-CM | POA: Diagnosis not present

## 2014-12-10 DIAGNOSIS — I2510 Atherosclerotic heart disease of native coronary artery without angina pectoris: Secondary | ICD-10-CM | POA: Diagnosis not present

## 2014-12-10 DIAGNOSIS — K209 Esophagitis, unspecified: Secondary | ICD-10-CM | POA: Diagnosis not present

## 2014-12-10 DIAGNOSIS — K2991 Gastroduodenitis, unspecified, with bleeding: Secondary | ICD-10-CM | POA: Diagnosis not present

## 2014-12-10 DIAGNOSIS — I10 Essential (primary) hypertension: Secondary | ICD-10-CM | POA: Diagnosis not present

## 2014-12-10 DIAGNOSIS — K2901 Acute gastritis with bleeding: Secondary | ICD-10-CM | POA: Diagnosis not present

## 2014-12-11 DIAGNOSIS — I10 Essential (primary) hypertension: Secondary | ICD-10-CM | POA: Diagnosis not present

## 2014-12-11 DIAGNOSIS — K2991 Gastroduodenitis, unspecified, with bleeding: Secondary | ICD-10-CM | POA: Diagnosis not present

## 2014-12-11 DIAGNOSIS — I2510 Atherosclerotic heart disease of native coronary artery without angina pectoris: Secondary | ICD-10-CM | POA: Diagnosis not present

## 2014-12-11 DIAGNOSIS — I5042 Chronic combined systolic (congestive) and diastolic (congestive) heart failure: Secondary | ICD-10-CM | POA: Diagnosis not present

## 2014-12-11 DIAGNOSIS — K2901 Acute gastritis with bleeding: Secondary | ICD-10-CM | POA: Diagnosis not present

## 2014-12-11 DIAGNOSIS — K209 Esophagitis, unspecified: Secondary | ICD-10-CM | POA: Diagnosis not present

## 2014-12-15 ENCOUNTER — Encounter: Payer: Self-pay | Admitting: Internal Medicine

## 2014-12-15 ENCOUNTER — Telehealth: Payer: Self-pay | Admitting: Gastroenterology

## 2014-12-15 NOTE — Telephone Encounter (Signed)
Please call pt. HER ESOPHAGUS BIOPSIES SHOWED DAMAGE FROM ACID REFLUX. HER stomach Bx shows mild gastritis. PROTONIX BID FOR 3 MOS THEM DAILY FOREVER. E30 visit w/ RMR REFLUX ESOPHAGITIS/GASTRITIS.

## 2014-12-15 NOTE — Telephone Encounter (Signed)
APPOINTMENT MADE AND LETTER SENT °

## 2014-12-16 DIAGNOSIS — I2510 Atherosclerotic heart disease of native coronary artery without angina pectoris: Secondary | ICD-10-CM | POA: Diagnosis not present

## 2014-12-16 DIAGNOSIS — K2901 Acute gastritis with bleeding: Secondary | ICD-10-CM | POA: Diagnosis not present

## 2014-12-16 DIAGNOSIS — D649 Anemia, unspecified: Secondary | ICD-10-CM | POA: Diagnosis not present

## 2014-12-16 DIAGNOSIS — I5042 Chronic combined systolic (congestive) and diastolic (congestive) heart failure: Secondary | ICD-10-CM | POA: Diagnosis not present

## 2014-12-16 DIAGNOSIS — K2991 Gastroduodenitis, unspecified, with bleeding: Secondary | ICD-10-CM | POA: Diagnosis not present

## 2014-12-16 DIAGNOSIS — K209 Esophagitis, unspecified: Secondary | ICD-10-CM | POA: Diagnosis not present

## 2014-12-16 DIAGNOSIS — I10 Essential (primary) hypertension: Secondary | ICD-10-CM | POA: Diagnosis not present

## 2014-12-16 NOTE — Telephone Encounter (Signed)
Tried to call pt- number was busy  

## 2014-12-17 NOTE — Telephone Encounter (Signed)
Tried to call pt- number is busy

## 2014-12-18 DIAGNOSIS — K2901 Acute gastritis with bleeding: Secondary | ICD-10-CM | POA: Diagnosis not present

## 2014-12-18 DIAGNOSIS — I10 Essential (primary) hypertension: Secondary | ICD-10-CM | POA: Diagnosis not present

## 2014-12-18 DIAGNOSIS — K209 Esophagitis, unspecified: Secondary | ICD-10-CM | POA: Diagnosis not present

## 2014-12-18 DIAGNOSIS — I5042 Chronic combined systolic (congestive) and diastolic (congestive) heart failure: Secondary | ICD-10-CM | POA: Diagnosis not present

## 2014-12-18 DIAGNOSIS — K2991 Gastroduodenitis, unspecified, with bleeding: Secondary | ICD-10-CM | POA: Diagnosis not present

## 2014-12-18 DIAGNOSIS — I2510 Atherosclerotic heart disease of native coronary artery without angina pectoris: Secondary | ICD-10-CM | POA: Diagnosis not present

## 2014-12-18 NOTE — Telephone Encounter (Signed)
Letter mailed to the pt. 

## 2014-12-23 DIAGNOSIS — I2510 Atherosclerotic heart disease of native coronary artery without angina pectoris: Secondary | ICD-10-CM | POA: Diagnosis not present

## 2014-12-23 DIAGNOSIS — K2901 Acute gastritis with bleeding: Secondary | ICD-10-CM | POA: Diagnosis not present

## 2014-12-23 DIAGNOSIS — K209 Esophagitis, unspecified: Secondary | ICD-10-CM | POA: Diagnosis not present

## 2014-12-23 DIAGNOSIS — K2991 Gastroduodenitis, unspecified, with bleeding: Secondary | ICD-10-CM | POA: Diagnosis not present

## 2014-12-23 DIAGNOSIS — D649 Anemia, unspecified: Secondary | ICD-10-CM | POA: Diagnosis not present

## 2014-12-23 DIAGNOSIS — I10 Essential (primary) hypertension: Secondary | ICD-10-CM | POA: Diagnosis not present

## 2014-12-23 DIAGNOSIS — I5042 Chronic combined systolic (congestive) and diastolic (congestive) heart failure: Secondary | ICD-10-CM | POA: Diagnosis not present

## 2014-12-25 DIAGNOSIS — I5042 Chronic combined systolic (congestive) and diastolic (congestive) heart failure: Secondary | ICD-10-CM | POA: Diagnosis not present

## 2014-12-25 DIAGNOSIS — K2991 Gastroduodenitis, unspecified, with bleeding: Secondary | ICD-10-CM | POA: Diagnosis not present

## 2014-12-25 DIAGNOSIS — I2510 Atherosclerotic heart disease of native coronary artery without angina pectoris: Secondary | ICD-10-CM | POA: Diagnosis not present

## 2014-12-25 DIAGNOSIS — I10 Essential (primary) hypertension: Secondary | ICD-10-CM | POA: Diagnosis not present

## 2014-12-25 DIAGNOSIS — K2901 Acute gastritis with bleeding: Secondary | ICD-10-CM | POA: Diagnosis not present

## 2014-12-25 DIAGNOSIS — K209 Esophagitis, unspecified: Secondary | ICD-10-CM | POA: Diagnosis not present

## 2014-12-29 DIAGNOSIS — I5042 Chronic combined systolic (congestive) and diastolic (congestive) heart failure: Secondary | ICD-10-CM | POA: Diagnosis not present

## 2014-12-29 DIAGNOSIS — K2901 Acute gastritis with bleeding: Secondary | ICD-10-CM | POA: Diagnosis not present

## 2014-12-29 DIAGNOSIS — K209 Esophagitis, unspecified: Secondary | ICD-10-CM | POA: Diagnosis not present

## 2014-12-29 DIAGNOSIS — I10 Essential (primary) hypertension: Secondary | ICD-10-CM | POA: Diagnosis not present

## 2014-12-29 DIAGNOSIS — K2991 Gastroduodenitis, unspecified, with bleeding: Secondary | ICD-10-CM | POA: Diagnosis not present

## 2014-12-29 DIAGNOSIS — I2510 Atherosclerotic heart disease of native coronary artery without angina pectoris: Secondary | ICD-10-CM | POA: Diagnosis not present

## 2014-12-30 DIAGNOSIS — I5042 Chronic combined systolic (congestive) and diastolic (congestive) heart failure: Secondary | ICD-10-CM | POA: Diagnosis not present

## 2014-12-30 DIAGNOSIS — K2991 Gastroduodenitis, unspecified, with bleeding: Secondary | ICD-10-CM | POA: Diagnosis not present

## 2014-12-30 DIAGNOSIS — K2901 Acute gastritis with bleeding: Secondary | ICD-10-CM | POA: Diagnosis not present

## 2014-12-30 DIAGNOSIS — I2510 Atherosclerotic heart disease of native coronary artery without angina pectoris: Secondary | ICD-10-CM | POA: Diagnosis not present

## 2014-12-30 DIAGNOSIS — K209 Esophagitis, unspecified: Secondary | ICD-10-CM | POA: Diagnosis not present

## 2014-12-30 DIAGNOSIS — I10 Essential (primary) hypertension: Secondary | ICD-10-CM | POA: Diagnosis not present

## 2015-01-04 DIAGNOSIS — I2510 Atherosclerotic heart disease of native coronary artery without angina pectoris: Secondary | ICD-10-CM | POA: Diagnosis not present

## 2015-01-04 DIAGNOSIS — K2901 Acute gastritis with bleeding: Secondary | ICD-10-CM | POA: Diagnosis not present

## 2015-01-04 DIAGNOSIS — K2991 Gastroduodenitis, unspecified, with bleeding: Secondary | ICD-10-CM | POA: Diagnosis not present

## 2015-01-04 DIAGNOSIS — I5042 Chronic combined systolic (congestive) and diastolic (congestive) heart failure: Secondary | ICD-10-CM | POA: Diagnosis not present

## 2015-01-04 DIAGNOSIS — K209 Esophagitis, unspecified: Secondary | ICD-10-CM | POA: Diagnosis not present

## 2015-01-04 DIAGNOSIS — I10 Essential (primary) hypertension: Secondary | ICD-10-CM | POA: Diagnosis not present

## 2015-01-06 DIAGNOSIS — I5042 Chronic combined systolic (congestive) and diastolic (congestive) heart failure: Secondary | ICD-10-CM | POA: Diagnosis not present

## 2015-01-06 DIAGNOSIS — I10 Essential (primary) hypertension: Secondary | ICD-10-CM | POA: Diagnosis not present

## 2015-01-06 DIAGNOSIS — I2510 Atherosclerotic heart disease of native coronary artery without angina pectoris: Secondary | ICD-10-CM | POA: Diagnosis not present

## 2015-01-06 DIAGNOSIS — K2991 Gastroduodenitis, unspecified, with bleeding: Secondary | ICD-10-CM | POA: Diagnosis not present

## 2015-01-06 DIAGNOSIS — K209 Esophagitis, unspecified: Secondary | ICD-10-CM | POA: Diagnosis not present

## 2015-01-06 DIAGNOSIS — K2901 Acute gastritis with bleeding: Secondary | ICD-10-CM | POA: Diagnosis not present

## 2015-01-08 DIAGNOSIS — I5042 Chronic combined systolic (congestive) and diastolic (congestive) heart failure: Secondary | ICD-10-CM | POA: Diagnosis not present

## 2015-01-08 DIAGNOSIS — K2991 Gastroduodenitis, unspecified, with bleeding: Secondary | ICD-10-CM | POA: Diagnosis not present

## 2015-01-08 DIAGNOSIS — K209 Esophagitis, unspecified: Secondary | ICD-10-CM | POA: Diagnosis not present

## 2015-01-08 DIAGNOSIS — I2510 Atherosclerotic heart disease of native coronary artery without angina pectoris: Secondary | ICD-10-CM | POA: Diagnosis not present

## 2015-01-08 DIAGNOSIS — I10 Essential (primary) hypertension: Secondary | ICD-10-CM | POA: Diagnosis not present

## 2015-01-08 DIAGNOSIS — K2901 Acute gastritis with bleeding: Secondary | ICD-10-CM | POA: Diagnosis not present

## 2015-01-13 DIAGNOSIS — K2991 Gastroduodenitis, unspecified, with bleeding: Secondary | ICD-10-CM | POA: Diagnosis not present

## 2015-01-13 DIAGNOSIS — K209 Esophagitis, unspecified: Secondary | ICD-10-CM | POA: Diagnosis not present

## 2015-01-13 DIAGNOSIS — I10 Essential (primary) hypertension: Secondary | ICD-10-CM | POA: Diagnosis not present

## 2015-01-13 DIAGNOSIS — K2901 Acute gastritis with bleeding: Secondary | ICD-10-CM | POA: Diagnosis not present

## 2015-01-13 DIAGNOSIS — I5042 Chronic combined systolic (congestive) and diastolic (congestive) heart failure: Secondary | ICD-10-CM | POA: Diagnosis not present

## 2015-01-13 DIAGNOSIS — I2510 Atherosclerotic heart disease of native coronary artery without angina pectoris: Secondary | ICD-10-CM | POA: Diagnosis not present

## 2015-01-20 DIAGNOSIS — K209 Esophagitis, unspecified: Secondary | ICD-10-CM | POA: Diagnosis not present

## 2015-01-20 DIAGNOSIS — I10 Essential (primary) hypertension: Secondary | ICD-10-CM | POA: Diagnosis not present

## 2015-01-20 DIAGNOSIS — I5042 Chronic combined systolic (congestive) and diastolic (congestive) heart failure: Secondary | ICD-10-CM | POA: Diagnosis not present

## 2015-01-20 DIAGNOSIS — K2991 Gastroduodenitis, unspecified, with bleeding: Secondary | ICD-10-CM | POA: Diagnosis not present

## 2015-01-20 DIAGNOSIS — K2901 Acute gastritis with bleeding: Secondary | ICD-10-CM | POA: Diagnosis not present

## 2015-01-20 DIAGNOSIS — I2510 Atherosclerotic heart disease of native coronary artery without angina pectoris: Secondary | ICD-10-CM | POA: Diagnosis not present

## 2015-01-30 DIAGNOSIS — I5042 Chronic combined systolic (congestive) and diastolic (congestive) heart failure: Secondary | ICD-10-CM | POA: Diagnosis not present

## 2015-01-30 DIAGNOSIS — I10 Essential (primary) hypertension: Secondary | ICD-10-CM | POA: Diagnosis not present

## 2015-01-30 DIAGNOSIS — K209 Esophagitis, unspecified: Secondary | ICD-10-CM | POA: Diagnosis not present

## 2015-01-30 DIAGNOSIS — I2510 Atherosclerotic heart disease of native coronary artery without angina pectoris: Secondary | ICD-10-CM | POA: Diagnosis not present

## 2015-01-30 DIAGNOSIS — K2991 Gastroduodenitis, unspecified, with bleeding: Secondary | ICD-10-CM | POA: Diagnosis not present

## 2015-01-30 DIAGNOSIS — K2901 Acute gastritis with bleeding: Secondary | ICD-10-CM | POA: Diagnosis not present

## 2015-02-09 ENCOUNTER — Encounter: Payer: Self-pay | Admitting: Internal Medicine

## 2015-02-09 ENCOUNTER — Ambulatory Visit (INDEPENDENT_AMBULATORY_CARE_PROVIDER_SITE_OTHER): Payer: Medicare Other | Admitting: Internal Medicine

## 2015-02-09 VITALS — BP 154/81 | HR 60 | Temp 97.2°F | Ht 60.0 in | Wt 125.2 lb

## 2015-02-09 DIAGNOSIS — K2100 Gastro-esophageal reflux disease with esophagitis, without bleeding: Secondary | ICD-10-CM

## 2015-02-09 NOTE — Progress Notes (Signed)
Primary Care Physician:  Alonza Bogus, MD Primary Gastroenterologist:  Dr. Gala Romney  Pre-Procedure History & Physical: HPI:  Susan Glass is a 81 y.o. female here for followup of GI bleed back in April of this year. She had melena; hemoglobin is 6.5. Also had dysphagia. EGD revealed ulcerative reflux esophagitis with stricture. She was dilated. Also a large hiatal hernia non-H. Pylori gastritis. She has done well; she was transfused. According to her son, home health recently did blood work her hemoglobin was up to 11.5. No dysphagia. No abdominal pain no melena. Overall doing much better. On Protonix 40 mg twice daily through the end of next month.  Past Medical History  Diagnosis Date  . Hypertension   . Hyperlipidemia     hypercholesterolemia  . CAD (coronary artery disease)   . CHF (congestive heart failure)   . GI bleed     UGI  . Hiatal hernia     large, sliding   . GERD (gastroesophageal reflux disease)     Past Surgical History  Procedure Laterality Date  . Cholecystectomy    . Appendectomy    . Esophagogastroduodenoscopy N/A 11/28/2014    Dr.Fields- UGI bleed d/t severe distal erosive esophagitis in the setting of GE JXN stricture, large sliding hiatal hernia, mild non-erosive gastritis  . Esophageal dilation N/A 11/28/2014    Procedure: ESOPHAGEAL DILATION;  Surgeon: Danie Binder, MD;  Location: [facility];  Service: Endoscopy;  Laterality: N/A;    Prior to Admission medications   Medication Sig Start Date End Date Taking? Authorizing Provider  acetaminophen (TYLENOL) 650 MG CR tablet Take 650 mg by mouth every 8 (eight) hours as needed for pain. PT said she takes 2 tablets every 7 hours   Yes Historical Provider, MD  ALPRAZolam (XANAX) 0.5 MG tablet Take 0.5 mg by mouth daily.    Yes Historical Provider, MD  amLODipine (NORVASC) 2.5 MG tablet TAKE ONE TABLET BY MOUTH ONCE DAILY 10/22/14  Yes Lendon Colonel, NP  ferrous sulfate (FERROUSUL) 325 (65 FE) MG  tablet Take 1 tablet (325 mg total) by mouth daily with breakfast. 11/29/14  Yes Sinda Du, MD  furosemide (LASIX) 40 MG tablet Take 1 tablet (40 mg total) by mouth daily. 11/29/14  Yes Sinda Du, MD  NON FORMULARY Natural Laxative/ takes only as needed   Yes Historical Provider, MD  Omega-3 Fatty Acids (FISH OIL) 1000 MG CAPS Take 1 capsule by mouth daily.     Yes Historical Provider, MD  pantoprazole (PROTONIX) 40 MG tablet Take 1 tablet (40 mg total) by mouth 2 (two) times daily before a meal. 11/29/14  Yes Sinda Du, MD    Allergies as of 02/09/2015  . (No Known Allergies)    No family history on file.  History   Social History  . Marital Status: Married    Spouse Name: N/A  . Number of Children: N/A  . Years of Education: N/A   Occupational History  . Not on file.   Social History Main Topics  . Smoking status: Never Smoker   . Smokeless tobacco: Never Used     Comment: Never smoked  . Alcohol Use: No  . Drug Use: No  . Sexual Activity: Not on file   Other Topics Concern  . Not on file   Social History Narrative    Review of Systems: See HPI, otherwise negative ROS  Physical Exam: BP 154/81 mmHg  Pulse 60  Temp(Src) 97.2 F (36.2 C) (  Oral)  Ht 5' (1.524 m)  Wt 125 lb 3.2 oz (56.79 kg)  BMI 24.45 kg/m2 General:  Frail elderly lady ambulates with a walker. She is oriented and conversant. pleasant and cooperative in NAD Skin:  Intact without significant lesions or rashes. Eyes:  No icterus. Conjunctiva light peak. Ears:  Normal auditory acuity. Nose:  No deformity, discharge,  or lesions. Mouth:  No deformity or lesions. Neck:  Supple; no masses or thyromegaly. No significant cervical adenopathy. Lungs:  Clear throughout to auscultation.   No wheezes, crackles, or rhonchi. No acute distress. Heart:  Regular rate and rhythm; no murmurs, clicks, rubs,  or gallops. Abdomen: Non-distended, normal bowel sounds.  Soft and nontender without appreciable  mass or hepatosplenomegaly.  Pulses:  Normal pulses noted. Extremities:  Without clubbing or edema.  Impression:   Recent upper GI bleed secondary to severe reflux esophagitis and non-H. Pylori gastritis. Large hiatal hernia. Dysphagia has resolved status post savory dilation.     Recommendations:  GERD information.  Continue Protonix twice daily through July and then drop back to once daily thereafter indefinitely  Avoid non-steriodal drugs like Aleve and Motrin; tylenol OK  Office visit here in the future as needed      Notice: This dictation was prepared with Dragon dictation along with smaller phrase technology. Any transcriptional errors that result from this process are unintentional and may not be corrected upon review.

## 2015-02-09 NOTE — Patient Instructions (Signed)
GERD information  Continue Protonix twice daily through July and then drop back to once daily thereafter indefinitely  Avoid non-steriodal drugs like Aleve and Motrin; tylenol OK  Office visit here in the future as needed

## 2015-02-21 ENCOUNTER — Other Ambulatory Visit: Payer: Self-pay | Admitting: Adult Health

## 2015-03-17 ENCOUNTER — Encounter: Payer: Self-pay | Admitting: Internal Medicine

## 2015-03-31 ENCOUNTER — Other Ambulatory Visit: Payer: Self-pay | Admitting: Adult Health

## 2015-04-29 ENCOUNTER — Encounter: Payer: Self-pay | Admitting: Gastroenterology

## 2015-04-29 ENCOUNTER — Ambulatory Visit (INDEPENDENT_AMBULATORY_CARE_PROVIDER_SITE_OTHER): Payer: Medicare Other | Admitting: Gastroenterology

## 2015-04-29 VITALS — BP 149/84 | HR 89 | Temp 97.1°F | Ht 60.0 in | Wt 130.2 lb

## 2015-04-29 DIAGNOSIS — K2100 Gastro-esophageal reflux disease with esophagitis, without bleeding: Secondary | ICD-10-CM

## 2015-04-29 DIAGNOSIS — K219 Gastro-esophageal reflux disease without esophagitis: Secondary | ICD-10-CM | POA: Insufficient documentation

## 2015-04-29 MED ORDER — PANTOPRAZOLE SODIUM 40 MG PO TBEC: 40.0000 mg | DELAYED_RELEASE_TABLET | Freq: Every day | ORAL | Status: DC

## 2015-04-29 NOTE — Patient Instructions (Signed)
I have refilled your Protonix for a year. You will get it in 90 day supply, so that you don't have to go as often to the pharmacy.  I can see you back as needed!

## 2015-05-01 NOTE — Progress Notes (Signed)
Referring Provider: Sinda Du, MD Primary Care Physician:  Alonza Bogus, MD  Primary GI: Dr. Gala Romney   Chief Complaint  Patient presents with  . Follow-up    HPI:   Susan Glass is a 81 y.o. female presenting today with a history of hospitalization for GI bleed in April 2016. EGD revealed ulcerative reflux esophagitis with stricture s/p dilation. Large hiatal hernia with non-H.pylori gastritis. Here for routine follow-up. On Protonix once daily. She has no dysphagia, reflux exacerbation, lack of appetite. No concerning lower GI symptoms.   Past Medical History  Diagnosis Date  . Hypertension   . Hyperlipidemia     hypercholesterolemia  . CAD (coronary artery disease)   . CHF (congestive heart failure)   . GI bleed     UGI  . Hiatal hernia     large, sliding   . GERD (gastroesophageal reflux disease)     Past Surgical History  Procedure Laterality Date  . Cholecystectomy    . Appendectomy    . Esophagogastroduodenoscopy N/A 11/28/2014    Dr.Fields- UGI bleed d/t severe distal erosive esophagitis in the setting of GE JXN stricture, large sliding hiatal hernia, mild non-erosive gastritis  . Esophageal dilation N/A 11/28/2014    Procedure: ESOPHAGEAL DILATION;  Surgeon: Danie Binder, MD;  Location: [facility];  Service: Endoscopy;  Laterality: N/A;    Current Outpatient Prescriptions  Medication Sig Dispense Refill  . acetaminophen (TYLENOL) 650 MG CR tablet Take 650 mg by mouth every 8 (eight) hours as needed for pain. PT said she takes 2 tablets every 7 hours    . ALPRAZolam (XANAX) 0.5 MG tablet Take 0.5 mg by mouth daily.     Marland Kitchen amLODipine (NORVASC) 2.5 MG tablet TAKE ONE TABLET BY MOUTH ONCE DAILY 30 tablet 11  . ferrous sulfate (FERROUSUL) 325 (65 FE) MG tablet Take 1 tablet (325 mg total) by mouth daily with breakfast. 30 tablet 3  . furosemide (LASIX) 40 MG tablet Take 1 tablet (40 mg total) by mouth daily. 30 tablet 11  . NON FORMULARY Natural  Laxative/ takes only as needed    . Omega-3 Fatty Acids (FISH OIL) 1000 MG CAPS Take 1 capsule by mouth daily.      . pantoprazole (PROTONIX) 40 MG tablet Take 1 tablet (40 mg total) by mouth daily. 90 tablet 3   No current facility-administered medications for this visit.    Allergies as of 04/29/2015  . (No Known Allergies)    Social History   Social History  . Marital Status: Married    Spouse Name: N/A  . Number of Children: N/A  . Years of Education: N/A   Social History Main Topics  . Smoking status: Never Smoker   . Smokeless tobacco: Never Used     Comment: Never smoked  . Alcohol Use: No  . Drug Use: No  . Sexual Activity: Not Asked   Other Topics Concern  . None   Social History Narrative    Review of Systems: As mentioned in HPI  Physical Exam: BP 149/84 mmHg  Pulse 89  Temp(Src) 97.1 F (36.2 C)  Ht 5' (1.524 m)  Wt 130 lb 3.2 oz (59.058 kg)  BMI 25.43 kg/m2 General:   Alert and oriented. No distress noted. Pleasant and cooperative.  Head:  Normocephalic and atraumatic.. Abdomen:  +BS, soft, non-tender and non-distended. No rebound or guarding.  Msk:  Kyphosis  Extremities:  Without edema. Neurologic:  Alert and  oriented x4;  grossly normal neurologically. Psych:  Alert and cooperative. Normal mood and affect.

## 2015-05-01 NOTE — Assessment & Plan Note (Signed)
81 year old female with history of ulcerative esophagitis, prior GI bleed earlier this year, doing well with Protonix once daily. No concerning lower or upper GI symptoms. Continue Protonix once daily and return as needed.

## 2015-05-04 NOTE — Progress Notes (Signed)
cc'ed to pcp °

## 2015-05-19 DIAGNOSIS — I10 Essential (primary) hypertension: Secondary | ICD-10-CM | POA: Diagnosis not present

## 2015-05-19 DIAGNOSIS — K922 Gastrointestinal hemorrhage, unspecified: Secondary | ICD-10-CM | POA: Diagnosis not present

## 2015-05-19 DIAGNOSIS — K200 Eosinophilic esophagitis: Secondary | ICD-10-CM | POA: Diagnosis not present

## 2015-05-19 DIAGNOSIS — M159 Polyosteoarthritis, unspecified: Secondary | ICD-10-CM | POA: Diagnosis not present

## 2015-08-03 ENCOUNTER — Other Ambulatory Visit: Payer: Self-pay | Admitting: Cardiovascular Disease

## 2016-04-03 ENCOUNTER — Other Ambulatory Visit: Payer: Self-pay | Admitting: Adult Health

## 2016-04-03 ENCOUNTER — Other Ambulatory Visit: Payer: Self-pay | Admitting: Cardiovascular Disease

## 2016-04-18 ENCOUNTER — Other Ambulatory Visit: Payer: Self-pay | Admitting: Cardiovascular Disease

## 2016-04-19 ENCOUNTER — Telehealth: Payer: Self-pay | Admitting: Adult Health

## 2016-04-19 NOTE — Telephone Encounter (Signed)
Pt daughter-in-law called stating that the pt is unable to make any appointments due to her knees being so bad with arthritis and at her age she is feeble. [name] stated that the pt is doing well overall and is not having any problems. She was wondering if we could refill her medications without her making a visit. Pt has not been seen by cardiology since 11/2013- as her 11/2014 visit was cancelled. Please advise.

## 2016-04-19 NOTE — Telephone Encounter (Signed)
Pt daughter in law made aware, made pt. An appointment for 9/13 with ML.

## 2016-04-19 NOTE — Telephone Encounter (Signed)
Pt. Gets her lasix and her amlodipine prescriptions form [place]. She has not had any lab work done since 11/2014 which was abnormal. Please advise.

## 2016-04-19 NOTE — Telephone Encounter (Signed)
What are the meds? I am not comfortable refilling meds if the patient has not been evaluated.

## 2016-04-19 NOTE — Telephone Encounter (Signed)
I don't know what her BP is nor her renal status. I would not prescribe unless there were some way she could get evaluated in clinic. Otherwise, maybe PCP would refill.

## 2016-04-23 ENCOUNTER — Other Ambulatory Visit: Payer: Self-pay | Admitting: Cardiovascular Disease

## 2016-04-27 ENCOUNTER — Ambulatory Visit: Payer: Medicare Other | Admitting: Physician Assistant

## 2016-05-04 ENCOUNTER — Other Ambulatory Visit (HOSPITAL_COMMUNITY)
Admission: RE | Admit: 2016-05-04 | Discharge: 2016-05-04 | Disposition: A | Discharge status: Home / Self Care | Payer: Medicare Other | Source: Ambulatory Visit | Attending: Physician Assistant | Admitting: Physician Assistant

## 2016-05-04 ENCOUNTER — Ambulatory Visit (INDEPENDENT_AMBULATORY_CARE_PROVIDER_SITE_OTHER): Payer: Medicare Other | Admitting: Physician Assistant

## 2016-05-04 ENCOUNTER — Encounter: Payer: Self-pay | Admitting: Physician Assistant

## 2016-05-04 VITALS — BP 130/76 | HR 85 | Ht <= 58 in

## 2016-05-04 DIAGNOSIS — I10 Essential (primary) hypertension: Secondary | ICD-10-CM | POA: Insufficient documentation

## 2016-05-04 DIAGNOSIS — R600 Localized edema: Secondary | ICD-10-CM

## 2016-05-04 DIAGNOSIS — D509 Iron deficiency anemia, unspecified: Secondary | ICD-10-CM | POA: Diagnosis not present

## 2016-05-04 DIAGNOSIS — R609 Edema, unspecified: Secondary | ICD-10-CM | POA: Diagnosis not present

## 2016-05-04 DIAGNOSIS — I5042 Chronic combined systolic (congestive) and diastolic (congestive) heart failure: Secondary | ICD-10-CM

## 2016-05-04 LAB — CBC WITH DIFFERENTIAL/PLATELET
Basophils Absolute: 0 10*3/uL (ref 0.0–0.1)
Basophils Relative: 0 %
EOS ABS: 0.1 10*3/uL (ref 0.0–0.7)
EOS PCT: 1 %
HCT: 39.9 % (ref 36.0–46.0)
Hemoglobin: 13.3 g/dL (ref 12.0–15.0)
Lymphocytes Relative: 31 %
Lymphs Abs: 2.3 10*3/uL (ref 0.7–4.0)
MCH: 31.8 pg (ref 26.0–34.0)
MCHC: 33.3 g/dL (ref 30.0–36.0)
MCV: 95.5 fL (ref 78.0–100.0)
Monocytes Absolute: 0.7 10*3/uL (ref 0.1–1.0)
Monocytes Relative: 9 %
Neutro Abs: 4.4 10*3/uL (ref 1.7–7.7)
Neutrophils Relative %: 59 %
PLATELETS: 195 10*3/uL (ref 150–400)
RBC: 4.18 MIL/uL (ref 3.87–5.11)
RDW: 14 % (ref 11.5–15.5)
WBC: 7.5 10*3/uL (ref 4.0–10.5)

## 2016-05-04 LAB — COMPREHENSIVE METABOLIC PANEL
ALT: 9 U/L — ABNORMAL LOW (ref 14–54)
AST: 17 U/L (ref 15–41)
Albumin: 4.1 g/dL (ref 3.5–5.0)
Alkaline Phosphatase: 81 U/L (ref 38–126)
Anion gap: 3 — ABNORMAL LOW (ref 5–15)
BUN: 20 mg/dL (ref 6–20)
CALCIUM: 8.6 mg/dL — AB (ref 8.9–10.3)
CHLORIDE: 104 mmol/L (ref 101–111)
CO2: 26 mmol/L (ref 22–32)
Creatinine, Ser: 0.79 mg/dL (ref 0.44–1.00)
Glucose, Bld: 113 mg/dL — ABNORMAL HIGH (ref 65–99)
POTASSIUM: 4.6 mmol/L (ref 3.5–5.1)
Sodium: 133 mmol/L — ABNORMAL LOW (ref 135–145)
Total Bilirubin: 0.4 mg/dL (ref 0.3–1.2)
Total Protein: 6.7 g/dL (ref 6.5–8.1)

## 2016-05-04 NOTE — Progress Notes (Signed)
Cardiology Office Note    Date:  05/04/2016   ID:  BRAYTON BROXSON, DOB May 19, 1917, MRN [id number]  PCP:  Susan Bogus, MD  Cardiologist: former Dr. Verl Blalock to be established with Dr. Bronson Ing  Chief Complaint  Patient presents with  . Follow-up    History of Present Illness:  Susan Glass is a 82 y.o. female with history of diastolic CHF, mild nonobstructive CAD on cath in 2010 with mild global LV systolic dysfunction, hypertension, moderate pulmonary hypertension, lower extremity edema and debilitating arthritis. 2-D echo in 2010 EF 55%.Last seen in our office 11/2013.   Needs medication refills but hasn't been seen here in 2 years so an office visit was scheduled.she is brought in today by her son is in a wheelchair. She still lives independently in an apartment and cooks for herself. Her biggest problem at is arthritic knees and trouble getting around. She uses a walker. She denies chest pain, palpitations, dyspnea, dyspnea on exertion, dizziness or presyncope.patient has not had labs in a long time and would like them checked.    Past Medical History:  Diagnosis Date  . CAD (coronary artery disease)   . CHF (congestive heart failure)   . GERD (gastroesophageal reflux disease)   . GI bleed    UGI  . Hiatal hernia    large, sliding   . Hyperlipidemia    hypercholesterolemia  . Hypertension     Past Surgical History:  Procedure Laterality Date  . APPENDECTOMY    . CHOLECYSTECTOMY    . ESOPHAGEAL DILATION N/A 11/28/2014   Procedure: ESOPHAGEAL DILATION;  Surgeon: Danie Binder, MD;  Location: [facility];  Service: Endoscopy;  Laterality: N/A;  . ESOPHAGOGASTRODUODENOSCOPY N/A 11/28/2014   Dr.Fields- UGI bleed d/t severe distal erosive esophagitis in the setting of GE JXN stricture, large sliding hiatal hernia, mild non-erosive gastritis    Current Medications: Outpatient Medications Prior to Visit  Medication Sig Dispense Refill  . acetaminophen (TYLENOL) 650 MG  CR tablet Take 650 mg by mouth every 8 (eight) hours as needed for pain. PT said she takes 2 tablets every 7 hours    . ALPRAZolam (XANAX) 0.5 MG tablet Take 0.5 mg by mouth daily.     Marland Kitchen amLODipine (NORVASC) 2.5 MG tablet TAKE ONE TABLET BY MOUTH ONCE DAILY **MUST  MAKE  FOLLOW  UP  APPOINTMENT  FOR  REFILLS** 15 tablet 0  . ferrous sulfate (FERROUSUL) 325 (65 FE) MG tablet Take 1 tablet (325 mg total) by mouth daily with breakfast. 30 tablet 3  . furosemide (LASIX) 80 MG tablet TAKE ONE-HALF TABLET BY MOUTH ONCE DAILY 30 tablet 0  . Omega-3 Fatty Acids (FISH OIL) 1000 MG CAPS Take 1 capsule by mouth daily.      . pantoprazole (PROTONIX) 40 MG tablet Take 1 tablet (40 mg total) by mouth daily. 90 tablet 3  . furosemide (LASIX) 40 MG tablet Take 1 tablet (40 mg total) by mouth daily. 30 tablet 11  . furosemide (LASIX) 40 MG tablet TAKE ONE TABLET BY MOUTH ONCE DAILY 30 tablet 6  . NON FORMULARY Natural Laxative/ takes only as needed     No facility-administered medications prior to visit.      Allergies:   Review of patient's allergies indicates no known allergies.   Social History   Social History  . Marital status: Married    Spouse name: N/A  . Number of children: N/A  . Years of education: N/A   Social  History Main Topics  . Smoking status: Never Smoker  . Smokeless tobacco: Never Used     Comment: Never smoked  . Alcohol use No  . Drug use: No  . Sexual activity: Not on file   Other Topics Concern  . Not on file   Social History Narrative  . No narrative on file     Family History:  The patient's   family history is not on file.   ROS:   Please see the history of present illness.    Review of Systems  Constitution: Negative.  HENT: Negative.   Eyes: Negative.   Cardiovascular: Positive for leg swelling.  Respiratory: Negative.   Hematologic/Lymphatic: Negative.   Musculoskeletal: Positive for arthritis, joint pain, joint swelling, muscle weakness, myalgias and  stiffness.  Gastrointestinal: Negative.   Genitourinary: Negative.   Neurological: Positive for tremors.   All other systems reviewed and are negative.   PHYSICAL EXAM:   VS:  BP 130/76   Pulse 85   Ht 4\' 10"  (1.473 m)   SpO2 90%   Physical Exam  GEN: Well nourished, well developed, in no acute distress, slight tremor  Neck: no JVD, carotid bruits, or masses Cardiac:RRR; 2/6 systolic murmur at the left sternal border, no rubs, or gallops  Respiratory:  clear to auscultation bilaterally, normal work of breathing GI: soft, nontender, nondistended, + BS [id number] of ankle edema on the left otherwise without cyanosis, clubbing,  Good distal pulses bilaterally MS: no deformity or atrophy  Skin: warm and dry, no rash Neuro:  Alert and Oriented x 3, Strength and sensation are intact Psych: euthymic mood, full affect  Wt Readings from Last 3 Encounters:  04/29/15 130 lb 3.2 oz (59.1 kg)  02/09/15 125 lb 3.2 oz (56.8 kg)  11/27/14 125 lb (56.7 kg)      Studies/Labs Reviewed:   EKG:  EKG is not ordered today, patient has trouble getting up on the table and because of her body habitus cannot be done sitting in the wheelchair. Patient is asymptomatic  Recent Labs: No results found for requested labs within last 8760 hours.   Lipid Panel    Component Value Date/Time   CHOL  11/12/2008 0400    129        ATP III CLASSIFICATION:  <200     mg/dL   Desirable  200-239  mg/dL   Borderline High  >=240    mg/dL   High          TRIG 68 11/12/2008 0400   HDL 39 (L) 11/12/2008 0400   CHOLHDL 3.3 11/12/2008 0400   VLDL 14 11/12/2008 0400   LDLCALC  11/12/2008 0400    76        Total Cholesterol/HDL:CHD Risk Coronary Heart Disease Risk Table                     Men   Women  1/2 Average Risk   3.4   3.3  Average Risk       5.0   4.4  2 X Average Risk   9.6   7.1  3 X Average Risk  23.4   11.0        Use the calculated Patient Ratio above and the CHD Risk Table to determine the  patient's CHD Risk.        ATP III CLASSIFICATION (LDL):  <100     mg/dL   Optimal  100-129  mg/dL   Near or Above  Optimal  130-159  mg/dL   Borderline  160-189  mg/dL   High  >190     mg/dL   Very High    Additional studies/ records that were reviewed today include:  2-D echo 2010  LEFT VENTRICLE:  -  Left ventricular size was at the upper limits of normal.  -  Overall left ventricular systolic function was at the lower        limits of normal.  -  Left ventricular ejection fraction was estimated to be 55 %.  -  There were no left ventricular regional wall motion        abnormalities.  -  Left ventricular wall thickness was at the upper limits of        normal.   AORTIC VALVE:  -  The aortic valve was trileaflet.  -  Aortic valve thickness was mildly increased.  -  There was normal aortic valve leaflet excursion. Cardiac Cath:   11/11/2008: Cardiac Cath Findings:   IMPRESSION:   1. Mild nonobstructive coronary artery disease.   2. Mild global left ventricular systolic dysfunction.   3. Mild-to-moderate elevation in pulmonary artery pressure.   4. Elevated wedge pressure.      RECOMMENDATIONS:  I will transfer the patient to the telemetry unit   following the catheterization.  We will begin mild diuresis and aim for   better blood pressure control.  I would also like to perform a surface   echocardiogram prior to discharge to get another assessment of her   overall left ventricular systolic function.  This would also be helpful   to look for abnormal relaxation and possible diastolic dysfunction as an   etiology of her chest discomfort.  I also would like to have another   assessment of her mitral regurgitation.            ASSESSMENT:    1. Essential hypertension   2. COMBINED HEART FAILURE, CHRONIC   3. Edema extremities   4. Iron deficiency anemia      PLAN:  In order of problems listed above:  Essential hypertension blood pressure  controlled. Will refill amlodipine 2.5 mg daily. Follow-up in 1 year with Dr. Bronson Ing to be established  Combined heart failure, chronic managed with Lasix. Doing well. Check renal function today  Edema chronic in her left ankle but otherwise stable  Iron deficiency anemia and history of prior GI bleed we'll check a CBC today with her other labs.    Medication Adjustments/Labs and Tests Ordered: Current medicines are reviewed at length with the patient today.  Concerns regarding medicines are outlined above.  Medication changes, Labs and Tests ordered today are listed in the Patient Instructions below. Patient Instructions  Your physician wants you to follow-up in: 1 Year with Dr. Bronson Ing. You will receive a reminder letter in the mail two months in advance. If you don't receive a letter, please call our office to schedule the follow-up appointment.  Your physician recommends that you continue on your current medications as directed. Please refer to the Current Medication list given to you today.  If you need a refill on your cardiac medications before your next appointment, please call your pharmacy.  Thank you for choosing [facility]!      Signed, Ermalinda Barrios, PA-C  05/04/2016 2:31 PM    [facility] [place], [place],   [address] Phone: [phone]; Fax: [phone]

## 2016-05-04 NOTE — Patient Instructions (Signed)
Your physician wants you to follow-up in: 1 Year with Dr. Koneswaran.  You will receive a reminder letter in the mail two months in advance. If you don't receive a letter, please call our office to schedule the follow-up appointment.  Your physician recommends that you continue on your current medications as directed. Please refer to the Current Medication list given to you today.  If you need a refill on your cardiac medications before your next appointment, please call your pharmacy.  Thank you for choosing [facility]!   

## 2016-05-09 ENCOUNTER — Other Ambulatory Visit: Payer: Self-pay | Admitting: Cardiovascular Disease

## 2016-06-07 ENCOUNTER — Other Ambulatory Visit: Payer: Self-pay | Admitting: Adult Health

## 2017-04-21 ENCOUNTER — Other Ambulatory Visit: Payer: Self-pay | Admitting: Physician Assistant

## 2017-05-12 ENCOUNTER — Encounter: Payer: Self-pay | Admitting: *Deleted

## 2017-05-12 ENCOUNTER — Ambulatory Visit (INDEPENDENT_AMBULATORY_CARE_PROVIDER_SITE_OTHER): Payer: Medicare Other | Admitting: Cardiovascular Disease

## 2017-05-12 VITALS — BP 112/64 | HR 73 | Ht 60.0 in | Wt 128.0 lb

## 2017-05-12 DIAGNOSIS — R600 Localized edema: Secondary | ICD-10-CM

## 2017-05-12 DIAGNOSIS — I5032 Chronic diastolic (congestive) heart failure: Secondary | ICD-10-CM

## 2017-05-12 DIAGNOSIS — I10 Essential (primary) hypertension: Secondary | ICD-10-CM | POA: Diagnosis not present

## 2017-05-12 NOTE — Patient Instructions (Signed)
Your physician wants you to follow-up in: 1 year with Dr.Koneswaran You will receive a reminder letter in the mail two months in advance. If you don't receive a letter, please call our office to schedule the follow-up appointment.     Your physician recommends that you continue on your current medications as directed. Please refer to the Current Medication list given to you today.    If you need a refill on your cardiac medications before your next appointment, please call your pharmacy.    No lab work or test ordered today.     Thank you for choosing [facility] !

## 2017-05-12 NOTE — Progress Notes (Signed)
SUBJECTIVE: The patient presents for routine follow-up. This is my first time meeting her. She is 83 years old and has a history of chronic diastolic heart failure, mild nonobstructive coronary artery disease by coronary angiography in 2010, hypertension, pulmonary hypertension due to diastolic dysfunction, and lower extremity edema. Ejection fraction was 55% in 2010 by echocardiogram.  She lives independently in an apartment and cooks for herself. She has bilateral knee arthritis. She uses a walker.  I personally reviewed labs performed on 05/04/16: Sodium 133, potassium 4.6, BUN 20, creatinine 0.79, calcium 8.6, white blood cells 7.5, hemoglobin 13.3, platelets 195.  ECG performed in the office today which I ordered and personally interpreted demonstrates normal sinus rhythm with nonspecific T-wave abnormalities.  She denies chest pain and shortness of breath. She has chronic left ankle swelling.  She spoke to me at length about her close walk with God.    Review of Systems: As per "subjective", otherwise negative.  No Known Allergies  Current Outpatient Prescriptions  Medication Sig Dispense Refill  . acetaminophen (TYLENOL) 650 MG CR tablet Take 650 mg by mouth every 8 (eight) hours as needed for pain. PT said she takes 2 tablets every 7 hours    . ALPRAZolam (XANAX) 0.5 MG tablet Take 0.5 mg by mouth daily.     Marland Kitchen amLODipine (NORVASC) 2.5 MG tablet TAKE ONE TABLET BY MOUTH ONCE DAILY 90 tablet 1  . ferrous sulfate (FERROUSUL) 325 (65 FE) MG tablet Take 1 tablet (325 mg total) by mouth daily with breakfast. 30 tablet 3  . furosemide (LASIX) 80 MG tablet Take 1/2 Tablet By Mouth Daily 90 tablet 2  . Omega-3 Fatty Acids (FISH OIL) 1000 MG CAPS Take 1 capsule by mouth daily.      . pantoprazole (PROTONIX) 40 MG tablet Take 1 tablet (40 mg total) by mouth daily. 90 tablet 3   No current facility-administered medications for this visit.     Past Medical History:  Diagnosis Date   . CAD (coronary artery disease)   . CHF (congestive heart failure) ([facility])   . GERD (gastroesophageal reflux disease)   . GI bleed    UGI  . Hiatal hernia    large, sliding   . Hyperlipidemia    hypercholesterolemia  . Hypertension     Past Surgical History:  Procedure Laterality Date  . APPENDECTOMY    . CHOLECYSTECTOMY    . ESOPHAGEAL DILATION N/A 11/28/2014   Procedure: ESOPHAGEAL DILATION;  Surgeon: Danie Binder, MD;  Location: [facility];  Service: Endoscopy;  Laterality: N/A;  . ESOPHAGOGASTRODUODENOSCOPY N/A 11/28/2014   Dr.Fields- UGI bleed d/t severe distal erosive esophagitis in the setting of GE JXN stricture, large sliding hiatal hernia, mild non-erosive gastritis    Social History   Social History  . Marital status: Married    Spouse name: N/A  . Number of children: N/A  . Years of education: N/A   Occupational History  . Not on file.   Social History Main Topics  . Smoking status: Never Smoker  . Smokeless tobacco: Never Used     Comment: Never smoked  . Alcohol use No  . Drug use: No  . Sexual activity: Not on file   Other Topics Concern  . Not on file   Social History Narrative  . No narrative on file     Vitals:   05/12/17 1243  BP: 112/64  Pulse: 73  SpO2: 96%  Weight: 128 lb (58.1 kg)  Height: 5' (1.524 m)    Wt Readings from Last 3 Encounters:  05/12/17 128 lb (58.1 kg)  04/29/15 130 lb 3.2 oz (59.1 kg)  02/09/15 125 lb 3.2 oz (56.8 kg)     PHYSICAL EXAM General: NAD HEENT: Tremor noted. Neck: No JVD, no thyromegaly. Lungs: Clear to auscultation bilaterally with normal respiratory effort. CV: Nondisplaced PMI.  Regular rate and rhythm, normal S1/S2, no S3/S4, no murmur. No pretibial with mild left periankle edema.  Abdomen: Soft, nontender, no distention.  Neurologic: Alert and oriented.  Psych: Normal affect. Skin: Normal. Musculoskeletal: Bilateral knee swelling.    ECG: Most recent ECG reviewed.   Labs:     Lipids: Lab Results  Component Value Date/Time   LDLCALC  11/12/2008 04:00 AM    76        Total Cholesterol/HDL:CHD Risk Coronary Heart Disease Risk Table                     Men   Women  1/2 Average Risk   3.4   3.3  Average Risk       5.0   4.4  2 X Average Risk   9.6   7.1  3 X Average Risk  23.4   11.0        Use the calculated Patient Ratio above and the CHD Risk Table to determine the patient's CHD Risk.        ATP III CLASSIFICATION (LDL):  <100     mg/dL   Optimal  100-129  mg/dL   Near or Above                    Optimal  130-159  mg/dL   Borderline  160-189  mg/dL   High  >190     mg/dL   Very High   CHOL  11/12/2008 04:00 AM    129        ATP III CLASSIFICATION:  <200     mg/dL   Desirable  200-239  mg/dL   Borderline High  >=240    mg/dL   High          TRIG 68 11/12/2008 04:00 AM   HDL 39 (L) 11/12/2008 04:00 AM       ASSESSMENT AND PLAN:  1. Chronic diastolic heart failure: Symptomatically stable. Euvolemic on Lasix 40 mg daily. Blood pressure is well controlled. No changes to therapy.  2. Hypertension: Controlled on low-dose amlodipine. No changes to therapy.  3. Bilateral leg edema: Controlled on Lasix 40 mg daily. No changes to therapy.   Disposition: Follow up 1 year.   Kate Sable, M.D., F.A.C.C.

## 2017-08-21 ENCOUNTER — Other Ambulatory Visit: Payer: Self-pay | Admitting: Physician Assistant

## 2017-10-17 ENCOUNTER — Other Ambulatory Visit: Payer: Self-pay | Admitting: Cardiovascular Disease

## 2017-12-27 ENCOUNTER — Other Ambulatory Visit: Payer: Self-pay

## 2017-12-27 ENCOUNTER — Encounter (HOSPITAL_COMMUNITY): Payer: Self-pay | Admitting: Emergency Medicine

## 2017-12-27 ENCOUNTER — Emergency Department (HOSPITAL_COMMUNITY): Payer: Medicare Other

## 2017-12-27 ENCOUNTER — Inpatient Hospital Stay (HOSPITAL_COMMUNITY)
Admission: EM | Admit: 2017-12-27 | Discharge: 2018-01-01 | DRG: 689 | Disposition: A | Discharge status: Skilled Nursing Facility | Payer: Medicare Other | Attending: Pulmonary Disease | Admitting: Pulmonary Disease

## 2017-12-27 DIAGNOSIS — B9620 Unspecified Escherichia coli [E. coli] as the cause of diseases classified elsewhere: Secondary | ICD-10-CM | POA: Diagnosis present

## 2017-12-27 DIAGNOSIS — G9340 Encephalopathy, unspecified: Secondary | ICD-10-CM | POA: Diagnosis present

## 2017-12-27 DIAGNOSIS — E7800 Pure hypercholesterolemia, unspecified: Secondary | ICD-10-CM | POA: Diagnosis present

## 2017-12-27 DIAGNOSIS — I10 Essential (primary) hypertension: Secondary | ICD-10-CM | POA: Diagnosis present

## 2017-12-27 DIAGNOSIS — N390 Urinary tract infection, site not specified: Secondary | ICD-10-CM | POA: Diagnosis not present

## 2017-12-27 DIAGNOSIS — Y929 Unspecified place or not applicable: Secondary | ICD-10-CM

## 2017-12-27 DIAGNOSIS — R404 Transient alteration of awareness: Secondary | ICD-10-CM | POA: Diagnosis not present

## 2017-12-27 DIAGNOSIS — M159 Polyosteoarthritis, unspecified: Secondary | ICD-10-CM | POA: Diagnosis present

## 2017-12-27 DIAGNOSIS — I2510 Atherosclerotic heart disease of native coronary artery without angina pectoris: Secondary | ICD-10-CM

## 2017-12-27 DIAGNOSIS — K219 Gastro-esophageal reflux disease without esophagitis: Secondary | ICD-10-CM | POA: Diagnosis present

## 2017-12-27 DIAGNOSIS — I130 Hypertensive heart and chronic kidney disease with heart failure and stage 1 through stage 4 chronic kidney disease, or unspecified chronic kidney disease: Secondary | ICD-10-CM | POA: Diagnosis present

## 2017-12-27 DIAGNOSIS — E785 Hyperlipidemia, unspecified: Secondary | ICD-10-CM | POA: Diagnosis present

## 2017-12-27 DIAGNOSIS — Z79899 Other long term (current) drug therapy: Secondary | ICD-10-CM

## 2017-12-27 DIAGNOSIS — I25110 Atherosclerotic heart disease of native coronary artery with unstable angina pectoris: Secondary | ICD-10-CM | POA: Diagnosis present

## 2017-12-27 DIAGNOSIS — G9341 Metabolic encephalopathy: Secondary | ICD-10-CM | POA: Diagnosis present

## 2017-12-27 DIAGNOSIS — Z9049 Acquired absence of other specified parts of digestive tract: Secondary | ICD-10-CM

## 2017-12-27 DIAGNOSIS — I5042 Chronic combined systolic (congestive) and diastolic (congestive) heart failure: Secondary | ICD-10-CM | POA: Diagnosis present

## 2017-12-27 DIAGNOSIS — I200 Unstable angina: Secondary | ICD-10-CM | POA: Diagnosis not present

## 2017-12-27 DIAGNOSIS — R110 Nausea: Secondary | ICD-10-CM | POA: Diagnosis present

## 2017-12-27 DIAGNOSIS — Z66 Do not resuscitate: Secondary | ICD-10-CM | POA: Diagnosis present

## 2017-12-27 DIAGNOSIS — N189 Chronic kidney disease, unspecified: Secondary | ICD-10-CM | POA: Diagnosis present

## 2017-12-27 DIAGNOSIS — M25562 Pain in left knee: Secondary | ICD-10-CM | POA: Diagnosis not present

## 2017-12-27 DIAGNOSIS — M25561 Pain in right knee: Secondary | ICD-10-CM | POA: Diagnosis not present

## 2017-12-27 DIAGNOSIS — T6291XA Toxic effect of unspecified noxious substance eaten as food, accidental (unintentional), initial encounter: Secondary | ICD-10-CM | POA: Diagnosis present

## 2017-12-27 DIAGNOSIS — R531 Weakness: Secondary | ICD-10-CM | POA: Diagnosis present

## 2017-12-27 LAB — CBC WITH DIFFERENTIAL/PLATELET
BASOS ABS: 0 10*3/uL (ref 0.0–0.1)
Basophils Relative: 0 %
Eosinophils Absolute: 0.2 10*3/uL (ref 0.0–0.7)
Eosinophils Relative: 1 %
HEMATOCRIT: 42.5 % (ref 36.0–46.0)
HEMOGLOBIN: 14.1 g/dL (ref 12.0–15.0)
LYMPHS PCT: 14 %
Lymphs Abs: 2 10*3/uL (ref 0.7–4.0)
MCH: 31.3 pg (ref 26.0–34.0)
MCHC: 33.2 g/dL (ref 30.0–36.0)
MCV: 94.4 fL (ref 78.0–100.0)
Monocytes Absolute: 1 10*3/uL (ref 0.1–1.0)
Monocytes Relative: 7 %
NEUTROS PCT: 78 %
Neutro Abs: 10.8 10*3/uL — ABNORMAL HIGH (ref 1.7–7.7)
Platelets: 203 10*3/uL (ref 150–400)
RBC: 4.5 MIL/uL (ref 3.87–5.11)
RDW: 13.8 % (ref 11.5–15.5)
WBC: 14 10*3/uL — AB (ref 4.0–10.5)

## 2017-12-27 LAB — AMMONIA: Ammonia: <9 umol/L — ABNORMAL LOW (ref 9–35)

## 2017-12-27 LAB — URINALYSIS, ROUTINE W REFLEX MICROSCOPIC
BILIRUBIN URINE: NEGATIVE
Glucose, UA: NEGATIVE mg/dL
HGB URINE DIPSTICK: NEGATIVE
Ketones, ur: NEGATIVE mg/dL
NITRITE: POSITIVE — AB
PROTEIN: NEGATIVE mg/dL
SPECIFIC GRAVITY, URINE: 1.01 (ref 1.005–1.030)
pH: 5 (ref 5.0–8.0)

## 2017-12-27 LAB — TSH: TSH: 0.818 u[IU]/mL (ref 0.350–4.500)

## 2017-12-27 LAB — COMPREHENSIVE METABOLIC PANEL
ALBUMIN: 4.3 g/dL (ref 3.5–5.0)
ALT: 12 U/L — ABNORMAL LOW (ref 14–54)
AST: 18 U/L (ref 15–41)
Alkaline Phosphatase: 81 U/L (ref 38–126)
Anion gap: 9 (ref 5–15)
BILIRUBIN TOTAL: 0.8 mg/dL (ref 0.3–1.2)
BUN: 26 mg/dL — ABNORMAL HIGH (ref 6–20)
CO2: 25 mmol/L (ref 22–32)
Calcium: 9.4 mg/dL (ref 8.9–10.3)
Chloride: 100 mmol/L — ABNORMAL LOW (ref 101–111)
Creatinine, Ser: 0.96 mg/dL (ref 0.44–1.00)
GFR calc Af Amer: 55 mL/min — ABNORMAL LOW (ref >60)
GFR calc non Af Amer: 47 mL/min — ABNORMAL LOW (ref >60)
GLUCOSE: 104 mg/dL — AB (ref 65–99)
POTASSIUM: 4.5 mmol/L (ref 3.5–5.1)
Sodium: 134 mmol/L — ABNORMAL LOW (ref 135–145)
TOTAL PROTEIN: 7.3 g/dL (ref 6.5–8.1)

## 2017-12-27 LAB — I-STAT CG4 LACTIC ACID, ED: Lactic Acid, Venous: 1.64 mmol/L (ref 0.5–1.9)

## 2017-12-27 LAB — TROPONIN I

## 2017-12-27 LAB — LIPASE, BLOOD: Lipase: 48 U/L (ref 11–51)

## 2017-12-27 LAB — BRAIN NATRIURETIC PEPTIDE: B NATRIURETIC PEPTIDE 5: 194 pg/mL — AB (ref 0.0–100.0)

## 2017-12-27 MED ORDER — SODIUM CHLORIDE 0.9% FLUSH: 3.0000 mL | INTRAVENOUS | Status: DC | PRN

## 2017-12-27 MED ORDER — SODIUM CHLORIDE 0.9 % IV SOLN: 250.0000 mL | INTRAVENOUS | Status: DC | PRN

## 2017-12-27 MED ORDER — AMLODIPINE BESYLATE 5 MG PO TABS
2.5000 mg | ORAL_TABLET | Freq: Every day | ORAL | Status: DC
  Administered 2017-12-28: 2.5 mg via ORAL
  Filled 2017-12-27: qty 1

## 2017-12-27 MED ORDER — ACETAMINOPHEN 650 MG RE SUPP: 650.0000 mg | Freq: Four times a day (QID) | RECTAL | Status: DC | PRN

## 2017-12-27 MED ORDER — SODIUM CHLORIDE 0.9% FLUSH
3.0000 mL | Freq: Two times a day (BID) | INTRAVENOUS | Status: DC
  Administered 2017-12-27 – 2018-01-01 (×8): 3 mL via INTRAVENOUS

## 2017-12-27 MED ORDER — SENNOSIDES-DOCUSATE SODIUM 8.6-50 MG PO TABS
1.0000 | ORAL_TABLET | Freq: Every evening | ORAL | Status: DC | PRN
  Filled 2017-12-27: qty 1

## 2017-12-27 MED ORDER — SODIUM CHLORIDE 0.9% FLUSH
3.0000 mL | Freq: Two times a day (BID) | INTRAVENOUS | Status: DC
  Administered 2017-12-30: 3 mL via INTRAVENOUS

## 2017-12-27 MED ORDER — FUROSEMIDE 80 MG PO TABS
120.0000 mg | ORAL_TABLET | Freq: Every day | ORAL | Status: DC
  Administered 2017-12-28 – 2017-12-31 (×4): 120 mg via ORAL
  Filled 2017-12-27: qty 3
  Filled 2017-12-27 (×5): qty 1

## 2017-12-27 MED ORDER — PANTOPRAZOLE SODIUM 40 MG PO TBEC
40.0000 mg | DELAYED_RELEASE_TABLET | Freq: Every day | ORAL | Status: DC
  Administered 2017-12-28 – 2018-01-01 (×5): 40 mg via ORAL
  Filled 2017-12-27 (×5): qty 1

## 2017-12-27 MED ORDER — HEPARIN SODIUM (PORCINE) 5000 UNIT/ML IJ SOLN: 5000.0000 [IU] | Freq: Three times a day (TID) | INTRAMUSCULAR | Status: DC

## 2017-12-27 MED ORDER — BISACODYL 5 MG PO TBEC
5.0000 mg | DELAYED_RELEASE_TABLET | Freq: Every day | ORAL | Status: DC | PRN
  Administered 2017-12-31 – 2018-01-01 (×2): 5 mg via ORAL
  Filled 2017-12-27 (×2): qty 1

## 2017-12-27 MED ORDER — ACETAMINOPHEN 325 MG PO TABS
650.0000 mg | ORAL_TABLET | Freq: Four times a day (QID) | ORAL | Status: DC | PRN
  Administered 2017-12-28 – 2018-01-01 (×6): 650 mg via ORAL
  Filled 2017-12-27 (×6): qty 2

## 2017-12-27 MED ORDER — OMEGA-3-ACID ETHYL ESTERS 1 G PO CAPS
1.0000 g | ORAL_CAPSULE | Freq: Every day | ORAL | Status: DC
Start: 2017-12-28 — End: 2017-12-28
  Administered 2017-12-28: 1 g via ORAL
  Filled 2017-12-27: qty 1

## 2017-12-27 MED ORDER — ONDANSETRON HCL 4 MG PO TABS: 4.0000 mg | ORAL_TABLET | Freq: Four times a day (QID) | ORAL | Status: DC | PRN

## 2017-12-27 MED ORDER — ONDANSETRON HCL 4 MG/2ML IJ SOLN
4.0000 mg | Freq: Four times a day (QID) | INTRAMUSCULAR | Status: DC | PRN
  Administered 2017-12-28: 4 mg via INTRAVENOUS
  Filled 2017-12-27: qty 2

## 2017-12-27 MED ORDER — ALPRAZOLAM 0.5 MG PO TABS
0.5000 mg | ORAL_TABLET | Freq: Every evening | ORAL | Status: DC | PRN
Start: 2017-12-27 — End: 2018-01-01
  Administered 2017-12-27 – 2017-12-29 (×3): 0.5 mg via ORAL
  Filled 2017-12-27 (×3): qty 1

## 2017-12-27 MED ORDER — HYDROCODONE-ACETAMINOPHEN 5-325 MG PO TABS
1.0000 | ORAL_TABLET | ORAL | Status: DC | PRN
  Administered 2018-01-01: 1 via ORAL
  Filled 2017-12-27: qty 1

## 2017-12-27 NOTE — ED Notes (Signed)
Pt reports episode of weakness and nausea. Pt went to bathroom and unable to get off toilet. No vomiting or diarrhea. Reports symptoms have resolved

## 2017-12-27 NOTE — H&P (Signed)
History and Physical    Susan Glass DOB: 1916/10/09 DOA: 12/27/2017  PCP: Sinda Du, MD   Patient coming from: Home  Chief Complaint: Gen weakness, confusion   HPI: Susan Glass is a 84 y.o. female with medical history significant for coronary artery disease, chronic combined CHF, and hypertension, now presenting to the emergency department for evaluation of acute generalized weakness and confusion.  The patient had reportedly been in her usual state of health, was having an uneventful day, and then developed acute nausea with generalized weakness and confusion.  The patient reports that she ate a cheeseburger at approximately noon, and attributes the illness to this.  She reports developing severe nausea at approximately 1 PM, sat on the toilet, and experienced severe generalized weakness.  She called her son who was out of the house but nearby, and he called EMS and and her at home to find the patient confused and generally weak.  She required intensive assistance to get onto the gurney for transport to the hospital.  ED Course: Upon arrival to the ED, patient is found to be mild cardiomegaly and bronchitic changes.  Chemistry panel features a slight hyponatremia and elevated BUN to creatinine ratio.  Afebrile, saturating well on room air, and with vitals otherwise normal.  Noncontrast head CT is negative for acute intracranial abnormality and chest x-ray is notable for CBC is notable for a new leukocytosis to 14,000.  Lactic acid is reassuringly normal, troponin is undetectable, and BNP is elevated to 194.  Urinalysis was ordered and remains pending.  Patient's mental status has improved significantly while in the emergency department but she  continues to complain of a generalized weakness and lethargy.  She denies chest pain, headache, change in vision or hearing, or focal numbness or weakness.  She will be admitted for ongoing evaluation and management of acute generalized  weakness with acute encephalopathy that seems to have nearly resolved.  Review of Systems:  All other systems reviewed and apart from HPI, are negative.  Past Medical History:  Diagnosis Date  . CAD (coronary artery disease)   . CHF (congestive heart failure) ([place])   . GERD (gastroesophageal reflux disease)   . GI bleed    UGI  . Hiatal hernia    large, sliding   . Hyperlipidemia    hypercholesterolemia  . Hypertension     Past Surgical History:  Procedure Laterality Date  . APPENDECTOMY    . CHOLECYSTECTOMY    . ESOPHAGEAL DILATION N/A 11/28/2014   Procedure: ESOPHAGEAL DILATION;  Surgeon: Danie Binder, MD;  Location: [facility];  Service: Endoscopy;  Laterality: N/A;  . ESOPHAGOGASTRODUODENOSCOPY N/A 11/28/2014   Dr.Fields- UGI bleed d/t severe distal erosive esophagitis in the setting of GE JXN stricture, large sliding hiatal hernia, mild non-erosive gastritis     reports that she has never smoked. She has never used smokeless tobacco. She reports that she does not drink alcohol or use drugs.  No Known Allergies  History reviewed. No pertinent family history.   Prior to Admission medications   Medication Sig Start Date End Date Taking? Authorizing Provider  acetaminophen (TYLENOL) 500 MG tablet Take 500 mg by mouth every 4 (four) hours.   Yes [provider]  ALPRAZolam Duanne Moron) 0.5 MG tablet Take 0.5 mg by mouth at bedtime.    Yes [provider]  amLODipine (NORVASC) 2.5 MG tablet TAKE 1 TABLET BY MOUTH ONCE DAILY 10/17/17  Yes Herminio Commons, MD  ferrous  sulfate (FERROUSUL) 325 (65 FE) MG tablet Take 1 tablet (325 mg total) by mouth daily with breakfast. 11/29/14  Yes Sinda Du, MD  furosemide (LASIX) 80 MG tablet TAKE ONE-HALF TABLET BY MOUTH ONCE DAILY 08/21/17  Yes Herminio Commons, MD  Omega-3 Fatty Acids (FISH OIL) 1000 MG CAPS Take 1 capsule by mouth daily.     Yes [provider]  pantoprazole (PROTONIX) 40 MG tablet  Take 1 tablet (40 mg total) by mouth daily. 04/29/15  Yes Annitta Needs, NP    Physical Exam: Vitals:   12/27/17 1752 12/27/17 1930  BP: (!) 166/96 (!) 154/81  Pulse: 76 83  Resp: 18 19  Temp: (!) 97.3 F (36.3 C)   TempSrc: Oral   SpO2: 100% 95%  Weight: 56.7 kg (125 lb)   Height: 5' (1.524 m)       Constitutional: NAD, calm  Eyes: PERTLA, lids and conjunctivae normal ENMT: Mucous membranes are moist. Posterior pharynx clear of any exudate or lesions.   Neck: normal, supple, no masses, no thyromegaly Respiratory: clear to auscultation bilaterally, no wheezing, no crackles. Normal respiratory effort.    Cardiovascular: S1 & S2 heard, regular rate and rhythm. Mild pretibial edema bilaterally. Abdomen: No distension, no tenderness, soft. Bowel sounds normal.  Musculoskeletal: no clubbing / cyanosis. No joint deformity upper and lower extremities.    Skin: no significant rashes, lesions, ulcers. Warm, dry, well-perfused. Neurologic: No facial asymmetry. Sensation to light touch intact. Strength 5/5 in all 4 limbs.  Psychiatric: Alert and oriented to person, place, and situation. Pleasant and cooperative.     Labs on Admission: I have personally reviewed following labs and imaging studies  CBC: Recent Labs  Lab 12/27/17 1745  WBC 14.0*  NEUTROABS 10.8*  HGB 14.1  HCT 42.5  MCV 94.4  PLT 539   Basic Metabolic Panel: Recent Labs  Lab 12/27/17 1745  NA 134*  K 4.5  CL 100*  CO2 25  GLUCOSE 104*  BUN 26*  CREATININE 0.96  CALCIUM 9.4   GFR: Estimated Creatinine Clearance: 25.2 mL/min (by C-G formula based on SCr of 0.96 mg/dL). Liver Function Tests: Recent Labs  Lab 12/27/17 1745  AST 18  ALT 12*  ALKPHOS 81  BILITOT 0.8  PROT 7.3  ALBUMIN 4.3   Recent Labs  Lab 12/27/17 1745  LIPASE 48   No results for input(s): AMMONIA in the last 168 hours. Coagulation Profile: No results for input(s): INR, PROTIME in the last 168 hours. Cardiac  Enzymes: Recent Labs  Lab 12/27/17 1745  TROPONINI <0.03   BNP (last 3 results) No results for input(s): PROBNP in the last 8760 hours. HbA1C: No results for input(s): HGBA1C in the last 72 hours. CBG: No results for input(s): GLUCAP in the last 168 hours. Lipid Profile: No results for input(s): CHOL, HDL, LDLCALC, TRIG, CHOLHDL, LDLDIRECT in the last 72 hours. Thyroid Function Tests: No results for input(s): TSH, T4TOTAL, FREET4, T3FREE, THYROIDAB in the last 72 hours. Anemia Panel: No results for input(s): VITAMINB12, FOLATE, FERRITIN, TIBC, IRON, RETICCTPCT in the last 72 hours. Urine analysis: No results found for: COLORURINE, APPEARANCEUR, LABSPEC, PHURINE, GLUCOSEU, HGBUR, BILIRUBINUR, KETONESUR, PROTEINUR, UROBILINOGEN, NITRITE, LEUKOCYTESUR Sepsis Labs: @LABRCNTIP (procalcitonin:4,lacticidven:4) )No results found for this or any previous visit (from the past 240 hour(s)).   Radiological Exams on Admission: Dg Chest 2 View  Result Date: 12/27/2017 CLINICAL DATA:  Altered mental status, tachypnea. EXAM: CHEST - 2 VIEW COMPARISON:  Chest radiograph October 19, 2009 FINDINGS: Cardiomediastinal  silhouette is cardiac silhouette is mildly enlarged. Tortuous calcified aorta. Moderate hiatal hernia. Mild bronchitic changes without pleural effusion focal consolidation. No pneumothorax. Chronic deformity bilateral shoulders. Osteopenia. Surgical clips in the included right abdomen compatible with cholecystectomy. IMPRESSION: Mild cardiomegaly.  Mild bronchitic changes. Aortic Atherosclerosis (ICD10-I70.0). Electronically Signed   By: Elon Alas M.D.   On: 12/27/2017 20:04   Ct Head Wo Contrast  Result Date: 12/27/2017 CLINICAL DATA:  Altered level of consciousness. History of hypertension, hyperlipidemia. EXAM: CT HEAD WITHOUT CONTRAST TECHNIQUE: Contiguous axial images were obtained from the base of the skull through the vertex without intravenous contrast. COMPARISON:  None.  FINDINGS: BRAIN: No intraparenchymal hemorrhage, mass effect nor midline shift. The ventricles and sulci are normal for age. Patchy supratentorial white matter hypodensities within normal range for patient's age, though non-specific are most compatible with chronic small vessel ischemic disease. Old LEFT basal ganglia lacunar infarct. No acute large vascular territory infarcts. No abnormal extra-axial fluid collections. Basal cisterns are patent. VASCULAR: Moderate calcific atherosclerosis of the carotid siphons. SKULL: No skull fracture. Osteopenia. No significant scalp soft tissue swelling. SINUSES/ORBITS: Lobulated paranasal sinus mucosal thickening. Trace LEFT mastoid effusion.The included ocular globes and orbital contents are non-suspicious. Status post bilateral ocular lens implants. OTHER: None. IMPRESSION: 1. No acute intracranial process. 2. Old LEFT basal ganglia lacunar infarct, otherwise negative noncontrast CT HEAD for age. Electronically Signed   By: Elon Alas M.D.   On: 12/27/2017 19:52    EKG: Not performed.   Assessment/Plan   1. Acute encephalopathy  - Presents with acute confusion and generalized weakness  - No focal neurologic deficits identified and head CT negative for acute findings  - Leukocytosis noted without fever; UA pending  - Check TSH, ammonia, RPR, folate, and B12  - Continue supportive care    2. Chronic combined CHF  - Appears well-compensated  - Continue Lasix, SLIV, daily wt    3. Hypertension  - BP at goal  - Continue Norvasc    4. CAD  - No anginal complaints  - Continue Lovaza     DVT prophylaxis: sq heparin  Code Status: DNR  Family Communication: Son updated at bedside Consults called: None Admission status: Observation    Vianne Bulls, MD [facility] Pager [phone]  If 7PM-7AM, please contact night-coverage [email or web address] Password [email or web address]  12/27/2017, 9:00 PM

## 2017-12-27 NOTE — ED Provider Notes (Signed)
[facility] EMERGENCY DEPARTMENT Provider Note   CSN: [id number] Arrival date & time: 12/27/17  1735     History   Chief Complaint Chief Complaint  Patient presents with  . Weakness    HPI Susan Glass is a 84 y.o. female.  HPI Patient presents via EMS after family found her acting in an atypical manner. ReportEDLY, the patient was last seen normal earlier today, but just prior to EMS arrival she was found by her son, on the toilet, acting unusual, stating that she felt unwell. Patient herself only states that" I was sick" otherwise mumbles, does not answer any questions appropriately, level 5 caveat secondary to mental status. EMS note that the patient is reportedly in good health, with no recent medical conditions, conveyed by family members.  Past Medical History:  Diagnosis Date  . CAD (coronary artery disease)   . CHF (congestive heart failure) ([place])   . GERD (gastroesophageal reflux disease)   . GI bleed    UGI  . Hiatal hernia    large, sliding   . Hyperlipidemia    hypercholesterolemia  . Hypertension     Patient Active Problem List   Diagnosis Date Noted  . GERD (gastroesophageal reflux disease) 04/29/2015  . Acute esophagitis 11/29/2014  . Gastritis and gastroduodenitis 11/29/2014  . Upper GI bleed   . GI bleed 11/27/2014  . Acute blood loss anemia 11/27/2014  . Melena 11/27/2014  . Weakness 11/27/2014  . Edema extremities 11/17/2010  . CAD, NATIVE VESSEL 08/19/2009  . COMBINED HEART FAILURE, CHRONIC 08/19/2009  . HYPERCHOLESTEROLEMIA 08/18/2009  . Essential hypertension 08/18/2009  . CHF 08/18/2009    Past Surgical History:  Procedure Laterality Date  . APPENDECTOMY    . CHOLECYSTECTOMY    . ESOPHAGEAL DILATION N/A 11/28/2014   Procedure: ESOPHAGEAL DILATION;  Surgeon: Danie Binder, MD;  Location: [facility];  Service: Endoscopy;  Laterality: N/A;  . ESOPHAGOGASTRODUODENOSCOPY N/A 11/28/2014   Dr.Fields- UGI bleed d/t severe distal  erosive esophagitis in the setting of GE JXN stricture, large sliding hiatal hernia, mild non-erosive gastritis     OB History   None      Home Medications    Prior to Admission medications   Medication Sig Start Date End Date Taking? Authorizing Provider  acetaminophen (TYLENOL) 500 MG tablet Take 500 mg by mouth every 4 (four) hours.   Yes [provider]  ALPRAZolam Duanne Moron) 0.5 MG tablet Take 0.5 mg by mouth at bedtime.    Yes [provider]  amLODipine (NORVASC) 2.5 MG tablet TAKE 1 TABLET BY MOUTH ONCE DAILY 10/17/17  Yes Herminio Commons, MD  ferrous sulfate (FERROUSUL) 325 (65 FE) MG tablet Take 1 tablet (325 mg total) by mouth daily with breakfast. 11/29/14  Yes Sinda Du, MD  furosemide (LASIX) 80 MG tablet TAKE ONE-HALF TABLET BY MOUTH ONCE DAILY 08/21/17  Yes Herminio Commons, MD  Omega-3 Fatty Acids (FISH OIL) 1000 MG CAPS Take 1 capsule by mouth daily.     Yes [provider]  pantoprazole (PROTONIX) 40 MG tablet Take 1 tablet (40 mg total) by mouth daily. 04/29/15  Yes Annitta Needs, NP    Family History History reviewed. No pertinent family history.  Social History Social History   Tobacco Use  . Smoking status: Never Smoker  . Smokeless tobacco: Never Used  . Tobacco comment: Never smoked  Substance Use Topics  . Alcohol use: No    Alcohol/week: 0.0 oz  . Drug  use: No     Allergies   Patient has no known allergies.   Review of Systems Review of Systems  Unable to perform ROS: Mental status change     Physical Exam Updated Vital Signs BP (!) 154/81   Pulse 83   Temp (!) 97.3 F (36.3 C) (Oral)   Resp 19   Ht 5' (1.524 m)   Wt 56.7 kg (125 lb)   SpO2 95%   BMI 24.41 kg/m   Physical Exam  Constitutional: She has a sickly appearance.  Frail elderly female sitting upright, repeating nonsensical phrases, occasionally stating that she was sick, but otherwise not offering consistent pieces of information.    HENT:  Head: Normocephalic and atraumatic.  Eyes: Conjunctivae and EOM are normal.  Cardiovascular: Normal rate and regular rhythm.  Pulmonary/Chest: Effort normal and breath sounds normal. No stridor. No respiratory distress.  Abdominal: There is no tenderness. There is no rigidity and no guarding.  Musculoskeletal: She exhibits no edema.  Neurological:  Atrophy, tremor, patient does not follow commands consistently  Skin: Skin is warm and dry.  Psychiatric: Cognition and memory are impaired.  Nursing note and vitals reviewed.    ED Treatments / Results  Labs (all labs ordered are listed, but only abnormal results are displayed) Labs Reviewed  COMPREHENSIVE METABOLIC PANEL - Abnormal; Notable for the following components:      Result Value   Sodium 134 (*)    Chloride 100 (*)    Glucose, Bld 104 (*)    BUN 26 (*)    ALT 12 (*)    GFR calc non Af Amer 47 (*)    GFR calc Af Amer 55 (*)    All other components within normal limits  CBC WITH DIFFERENTIAL/PLATELET - Abnormal; Notable for the following components:   WBC 14.0 (*)    Neutro Abs 10.8 (*)    All other components within normal limits  BRAIN NATRIURETIC PEPTIDE - Abnormal; Notable for the following components:   B Natriuretic Peptide 194.0 (*)    All other components within normal limits  LIPASE, BLOOD  TROPONIN I  I-STAT CG4 LACTIC ACID, ED    EKG None  Radiology Dg Chest 2 View  Result Date: 12/27/2017 CLINICAL DATA:  Altered mental status, tachypnea. EXAM: CHEST - 2 VIEW COMPARISON:  Chest radiograph October 19, 2009 FINDINGS: Cardiomediastinal silhouette is cardiac silhouette is mildly enlarged. Tortuous calcified aorta. Moderate hiatal hernia. Mild bronchitic changes without pleural effusion focal consolidation. No pneumothorax. Chronic deformity bilateral shoulders. Osteopenia. Surgical clips in the included right abdomen compatible with cholecystectomy. IMPRESSION: Mild cardiomegaly.  Mild bronchitic  changes. Aortic Atherosclerosis (ICD10-I70.0). Electronically Signed   By: Elon Alas M.D.   On: 12/27/2017 20:04   Ct Head Wo Contrast  Result Date: 12/27/2017 CLINICAL DATA:  Altered level of consciousness. History of hypertension, hyperlipidemia. EXAM: CT HEAD WITHOUT CONTRAST TECHNIQUE: Contiguous axial images were obtained from the base of the skull through the vertex without intravenous contrast. COMPARISON:  None. FINDINGS: BRAIN: No intraparenchymal hemorrhage, mass effect nor midline shift. The ventricles and sulci are normal for age. Patchy supratentorial white matter hypodensities within normal range for patient's age, though non-specific are most compatible with chronic small vessel ischemic disease. Old LEFT basal ganglia lacunar infarct. No acute large vascular territory infarcts. No abnormal extra-axial fluid collections. Basal cisterns are patent. VASCULAR: Moderate calcific atherosclerosis of the carotid siphons. SKULL: No skull fracture. Osteopenia. No significant scalp soft tissue swelling. SINUSES/ORBITS: Lobulated paranasal sinus  mucosal thickening. Trace LEFT mastoid effusion.The included ocular globes and orbital contents are non-suspicious. Status post bilateral ocular lens implants. OTHER: None. IMPRESSION: 1. No acute intracranial process. 2. Old LEFT basal ganglia lacunar infarct, otherwise negative noncontrast CT HEAD for age. Electronically Signed   By: Elon Alas M.D.   On: 12/27/2017 19:52    Procedures Procedures (including critical care time)  Medications Ordered in ED Medications - No data to display   Initial Impression / Assessment and Plan / ED Course  I have reviewed the triage vital signs and the nursing notes.  Pertinent labs & imaging results that were available during my care of the patient were reviewed by me and considered in my medical decision making (see chart for details).     1800: Patient much more awake and alert. She is also  accompanied by family members who notes that the patient was well earlier today, and after she ate a cheeseburger, her symptoms seem to begin, with endorsement of nausea, no vomiting, no diarrhea, no incontinence.    8:33 PM Patient continues to improve, now is verbal, slightly slow, but awake and alert. Patient attempted to ambulate, but was unsuccessful in doing so, due to weakness. Patient's findings generally reassuring, with no lactic acidosis, no substantial electrolyte abnormalities. Patient's urinalysis has not been collected, but she remains afebrile, denies urinary complaints. Given patient's transient nature of altered mental status, unclear circumstances, she will be admitted for further evaluation and management, though the initial studies are reassuring.   Final Clinical Impressions(s) / ED Diagnoses  Altered mental status   Carmin Muskrat, MD 12/27/17 2034

## 2017-12-27 NOTE — ED Triage Notes (Addendum)
PT family told EMS that pt was on the toilet this evening in her apartment and was breathing fast. PT c/o generalized weakness today but denies any pain. PT states she ate a hamburger at lunch and has been nauseated since then.

## 2017-12-28 ENCOUNTER — Other Ambulatory Visit: Payer: Self-pay

## 2017-12-28 DIAGNOSIS — Z9049 Acquired absence of other specified parts of digestive tract: Secondary | ICD-10-CM | POA: Diagnosis not present

## 2017-12-28 DIAGNOSIS — R404 Transient alteration of awareness: Secondary | ICD-10-CM | POA: Diagnosis present

## 2017-12-28 DIAGNOSIS — I5042 Chronic combined systolic (congestive) and diastolic (congestive) heart failure: Secondary | ICD-10-CM | POA: Diagnosis present

## 2017-12-28 DIAGNOSIS — E785 Hyperlipidemia, unspecified: Secondary | ICD-10-CM | POA: Diagnosis present

## 2017-12-28 DIAGNOSIS — Y929 Unspecified place or not applicable: Secondary | ICD-10-CM | POA: Diagnosis not present

## 2017-12-28 DIAGNOSIS — M25561 Pain in right knee: Secondary | ICD-10-CM | POA: Diagnosis not present

## 2017-12-28 DIAGNOSIS — E7800 Pure hypercholesterolemia, unspecified: Secondary | ICD-10-CM | POA: Diagnosis present

## 2017-12-28 DIAGNOSIS — N189 Chronic kidney disease, unspecified: Secondary | ICD-10-CM | POA: Diagnosis present

## 2017-12-28 DIAGNOSIS — R531 Weakness: Secondary | ICD-10-CM | POA: Diagnosis present

## 2017-12-28 DIAGNOSIS — B9620 Unspecified Escherichia coli [E. coli] as the cause of diseases classified elsewhere: Secondary | ICD-10-CM | POA: Diagnosis present

## 2017-12-28 DIAGNOSIS — M159 Polyosteoarthritis, unspecified: Secondary | ICD-10-CM | POA: Diagnosis present

## 2017-12-28 DIAGNOSIS — Z79899 Other long term (current) drug therapy: Secondary | ICD-10-CM | POA: Diagnosis not present

## 2017-12-28 DIAGNOSIS — Z66 Do not resuscitate: Secondary | ICD-10-CM | POA: Diagnosis present

## 2017-12-28 DIAGNOSIS — K219 Gastro-esophageal reflux disease without esophagitis: Secondary | ICD-10-CM | POA: Diagnosis present

## 2017-12-28 DIAGNOSIS — I25110 Atherosclerotic heart disease of native coronary artery with unstable angina pectoris: Secondary | ICD-10-CM | POA: Diagnosis present

## 2017-12-28 DIAGNOSIS — T6291XA Toxic effect of unspecified noxious substance eaten as food, accidental (unintentional), initial encounter: Secondary | ICD-10-CM | POA: Diagnosis present

## 2017-12-28 DIAGNOSIS — M25562 Pain in left knee: Secondary | ICD-10-CM | POA: Diagnosis not present

## 2017-12-28 DIAGNOSIS — I130 Hypertensive heart and chronic kidney disease with heart failure and stage 1 through stage 4 chronic kidney disease, or unspecified chronic kidney disease: Secondary | ICD-10-CM | POA: Diagnosis present

## 2017-12-28 DIAGNOSIS — G9341 Metabolic encephalopathy: Secondary | ICD-10-CM | POA: Diagnosis present

## 2017-12-28 DIAGNOSIS — R110 Nausea: Secondary | ICD-10-CM | POA: Diagnosis present

## 2017-12-28 DIAGNOSIS — N390 Urinary tract infection, site not specified: Secondary | ICD-10-CM | POA: Diagnosis present

## 2017-12-28 LAB — CBC WITH DIFFERENTIAL/PLATELET
BASOS PCT: 0 %
Basophils Absolute: 0 10*3/uL (ref 0.0–0.1)
Basophils Absolute: 0 10*3/uL (ref 0.0–0.1)
Basophils Relative: 0 %
EOS ABS: 0.2 10*3/uL (ref 0.0–0.7)
Eosinophils Absolute: 0.1 10*3/uL (ref 0.0–0.7)
Eosinophils Relative: 2 %
Eosinophils Relative: 1 %
HCT: 40.4 % (ref 36.0–46.0)
HCT: 43.1 % (ref 36.0–46.0)
HEMOGLOBIN: 13.2 g/dL (ref 12.0–15.0)
HEMOGLOBIN: 14.1 g/dL (ref 12.0–15.0)
LYMPHS ABS: 1.7 10*3/uL (ref 0.7–4.0)
LYMPHS ABS: 2.5 10*3/uL (ref 0.7–4.0)
LYMPHS PCT: 27 %
Lymphocytes Relative: 25 %
MCH: 31 pg (ref 26.0–34.0)
MCH: 30.9 pg (ref 26.0–34.0)
MCHC: 32.7 g/dL (ref 30.0–36.0)
MCHC: 32.7 g/dL (ref 30.0–36.0)
MCV: 94.8 fL (ref 78.0–100.0)
MCV: 94.3 fL (ref 78.0–100.0)
MONO ABS: 0.5 10*3/uL (ref 0.1–1.0)
MONOS PCT: 8 %
MONOS PCT: 10 %
Monocytes Absolute: 0.9 10*3/uL (ref 0.1–1.0)
NEUTROS PCT: 65 %
NEUTROS PCT: 62 %
Neutro Abs: 4.5 10*3/uL (ref 1.7–7.7)
Neutro Abs: 5.8 10*3/uL (ref 1.7–7.7)
Platelets: 202 10*3/uL (ref 150–400)
Platelets: 202 10*3/uL (ref 150–400)
RBC: 4.26 MIL/uL (ref 3.87–5.11)
RBC: 4.57 MIL/uL (ref 3.87–5.11)
RDW: 13.9 % (ref 11.5–15.5)
RDW: 14 % (ref 11.5–15.5)
WBC: 6.9 10*3/uL (ref 4.0–10.5)
WBC: 9.4 10*3/uL (ref 4.0–10.5)

## 2017-12-28 LAB — VITAMIN B12: VITAMIN B 12: 123 pg/mL — AB (ref 180–914)

## 2017-12-28 LAB — LIPASE, BLOOD: Lipase: 32 U/L (ref 11–51)

## 2017-12-28 LAB — BASIC METABOLIC PANEL
Anion gap: 8 (ref 5–15)
Anion gap: 13 (ref 5–15)
BUN: 22 mg/dL — ABNORMAL HIGH (ref 6–20)
BUN: 25 mg/dL — AB (ref 6–20)
CALCIUM: 8.8 mg/dL — AB (ref 8.9–10.3)
CHLORIDE: 103 mmol/L (ref 101–111)
CHLORIDE: 103 mmol/L (ref 101–111)
CO2: 26 mmol/L (ref 22–32)
CO2: 23 mmol/L (ref 22–32)
CREATININE: 0.67 mg/dL (ref 0.44–1.00)
Calcium: 9.1 mg/dL (ref 8.9–10.3)
Creatinine, Ser: 0.86 mg/dL (ref 0.44–1.00)
GFR calc Af Amer: >60 mL/min (ref >60)
GFR calc non Af Amer: >60 mL/min (ref >60)
GFR calc non Af Amer: 54 mL/min — ABNORMAL LOW (ref >60)
GLUCOSE: 124 mg/dL — AB (ref 65–99)
Glucose, Bld: 102 mg/dL — ABNORMAL HIGH (ref 65–99)
POTASSIUM: 4.1 mmol/L (ref 3.5–5.1)
Potassium: 3.7 mmol/L (ref 3.5–5.1)
SODIUM: 139 mmol/L (ref 135–145)
Sodium: 137 mmol/L (ref 135–145)

## 2017-12-28 LAB — TROPONIN I: Troponin I: <0.03 ng/mL (ref <0.03)

## 2017-12-28 LAB — PROTIME-INR
INR: 1.1
Prothrombin Time: 14.1 seconds (ref 11.4–15.2)

## 2017-12-28 LAB — HEPARIN LEVEL (UNFRACTIONATED): HEPARIN UNFRACTIONATED: 0.6 [IU]/mL (ref 0.30–0.70)

## 2017-12-28 LAB — MRSA PCR SCREENING: MRSA BY PCR: NEGATIVE

## 2017-12-28 MED ORDER — NITROGLYCERIN 2 % TD OINT
0.5000 [in_us] | TOPICAL_OINTMENT | Freq: Four times a day (QID) | TRANSDERMAL | Status: DC
  Administered 2017-12-28 – 2017-12-31 (×9): 0.5 [in_us] via TOPICAL
  Filled 2017-12-28 (×9): qty 1

## 2017-12-28 MED ORDER — NITROGLYCERIN 0.4 MG SL SUBL: 0.4000 mg | SUBLINGUAL_TABLET | SUBLINGUAL | Status: DC | PRN

## 2017-12-28 MED ORDER — ASPIRIN EC 325 MG PO TBEC
325.0000 mg | DELAYED_RELEASE_TABLET | Freq: Every day | ORAL | Status: DC
  Administered 2017-12-29 – 2018-01-01 (×4): 325 mg via ORAL
  Filled 2017-12-28 (×4): qty 1

## 2017-12-28 MED ORDER — HEPARIN BOLUS VIA INFUSION
3500.0000 [IU] | Freq: Once | INTRAVENOUS | Status: AC
  Administered 2017-12-28: 3500 [IU] via INTRAVENOUS
  Filled 2017-12-28: qty 3500

## 2017-12-28 MED ORDER — NITROGLYCERIN 0.4 MG SL SUBL
SUBLINGUAL_TABLET | SUBLINGUAL | Status: AC
  Administered 2017-12-28: 0.4 mg
  Filled 2017-12-28: qty 3

## 2017-12-28 MED ORDER — SODIUM CHLORIDE 0.9 % IV SOLN
1.0000 g | INTRAVENOUS | Status: DC
  Administered 2017-12-28 – 2018-01-01 (×5): 1 g via INTRAVENOUS
  Filled 2017-12-28: qty 10
  Filled 2017-12-28: qty 1
  Filled 2017-12-28: qty 10
  Filled 2017-12-28: qty 1
  Filled 2017-12-28: qty 10
  Filled 2017-12-28 (×2): qty 1

## 2017-12-28 MED ORDER — PROMETHAZINE HCL 25 MG/ML IJ SOLN
12.5000 mg | Freq: Once | INTRAMUSCULAR | Status: AC
  Administered 2017-12-28: 12.5 mg via INTRAVENOUS
  Filled 2017-12-28: qty 1

## 2017-12-28 MED ORDER — MORPHINE SULFATE (PF) 2 MG/ML IV SOLN
INTRAVENOUS | Status: AC
  Administered 2017-12-28: 2 mg via INTRAMUSCULAR
  Filled 2017-12-28: qty 1

## 2017-12-28 MED ORDER — HEPARIN (PORCINE) IN NACL 100-0.45 UNIT/ML-% IJ SOLN
700.0000 [IU]/h | INTRAMUSCULAR | Status: DC
  Administered 2017-12-28 – 2017-12-29 (×2): 700 [IU]/h via INTRAVENOUS
  Filled 2017-12-28 (×2): qty 250

## 2017-12-28 MED ORDER — FENTANYL CITRATE (PF) 100 MCG/2ML IJ SOLN
25.0000 ug | INTRAMUSCULAR | Status: DC | PRN
  Administered 2017-12-28 (×2): 25 ug via INTRAVENOUS
  Filled 2017-12-28: qty 2

## 2017-12-28 MED ORDER — SODIUM CHLORIDE 0.9 % IV BOLUS
500.0000 mL | Freq: Once | INTRAVENOUS | Status: AC
  Administered 2017-12-28: 500 mL via INTRAVENOUS

## 2017-12-28 MED ORDER — MORPHINE SULFATE (PF) 2 MG/ML IV SOLN: 2.0000 mg | Freq: Once | INTRAVENOUS | Status: AC

## 2017-12-28 MED ORDER — ORAL CARE MOUTH RINSE
15.0000 mL | Freq: Two times a day (BID) | OROMUCOSAL | Status: DC
  Administered 2017-12-29 – 2017-12-31 (×5): 15 mL via OROMUCOSAL

## 2017-12-28 MED ORDER — MORPHINE SULFATE (PF) 2 MG/ML IV SOLN: 2.0000 mg | INTRAVENOUS | Status: DC | PRN

## 2017-12-28 NOTE — Evaluation (Signed)
Physical Therapy Evaluation Patient Details Name: Susan Glass MRN: [id number] DOB: [birth year] Today's Date: 12/28/2017   History of Present Illness  Susan Glass is a 84 y.o. female with medical history significant for coronary artery disease, chronic combined CHF, and hypertension, now presenting to the emergency department for evaluation of acute generalized weakness and confusion.  The patient had reportedly been in her usual state of health, was having an uneventful day, and then developed acute nausea with generalized weakness and confusion.  The patient reports that she ate a cheeseburger at approximately noon, and attributes the illness to this.  She reports developing severe nausea at approximately 1 PM, sat on the toilet, and experienced severe generalized weakness.  She called her son who was out of the house but nearby, and he called EMS and and her at home to find the patient confused and generally weak.  She required intensive assistance to get onto the gurney for transport to the hospital.    Clinical Impression  Patient functioning near baseline for functional mobility and gait, demonstrates labored movement for sitting up at bedside and during sit to stands, ambulated in room without loss of balance using standard walker (SW), patient prefers to use SW secondary to more sturdy for her and patient tolerated sitting up in chair to eat breakfast - RN aware.  Patient will benefit from continued physical therapy in hospital and recommended venue below to increase strength, balance, endurance for safe ADLs and gait.    Follow Up Recommendations Home health PT;Supervision - Intermittent    Equipment Recommendations  None recommended by PT    Recommendations for Other Services       Precautions / Restrictions Precautions Precautions: Fall Restrictions Weight Bearing Restrictions: No      Mobility  Bed Mobility Overal bed mobility: Needs Assistance Bed Mobility: Supine to  Sit     Supine to sit: Min guard     General bed mobility comments: labored slow movement  Transfers Overall transfer level: Needs assistance Equipment used: Standard walker Transfers: Sit to/from Stand;Stand Pivot Transfers Sit to Stand: Min guard Stand pivot transfers: Supervision       General transfer comment: has difficulty for sit to stands due to BLE weakness  Ambulation/Gait Ambulation/Gait assistance: Supervision Ambulation Distance (Feet): 30 Feet Assistive device: Standard walker Gait Pattern/deviations: Decreased step length - right;Decreased step length - left;Decreased stride length Gait velocity: slow   General Gait Details: demonstrates slow unsteady cadence using standard walker without loss of balance, limited secondary to c/o fatigue  Stairs            Wheelchair Mobility    Modified Rankin (Stroke Patients Only)       Balance Overall balance assessment: Needs assistance Sitting-balance support: Feet supported;No upper extremity supported Sitting balance-Leahy Scale: Good     Standing balance support: Bilateral upper extremity supported;During functional activity Standing balance-Leahy Scale: Fair Standing balance comment: using standard walker (SW)                             Pertinent Vitals/Pain Pain Assessment: No/denies pain    Home Living Family/patient expects to be discharged to:: Private residence Living Arrangements: Alone Available Help at Discharge: Family Type of Home: Apartment Home Access: Level entry     Home Layout: One level Home Equipment: Walker - standard;Cane - single point      Prior Function Level of Independence: Needs assistance   Gait /  Transfers Assistance Needed: household ambulation with Standard Walker  ADL's / Homemaking Assistance Needed: assisted by Son and Grandson        Hand Dominance        Extremity/Trunk Assessment   Upper Extremity Assessment Upper Extremity  Assessment: Generalized weakness    Lower Extremity Assessment Lower Extremity Assessment: Generalized weakness    Cervical / Trunk Assessment Cervical / Trunk Assessment: Kyphotic  Communication   Communication: No difficulties  Cognition Arousal/Alertness: Awake/alert Behavior During Therapy: WFL for tasks assessed/performed Overall Cognitive Status: Within Functional Limits for tasks assessed                                        General Comments      Exercises     Assessment/Plan    PT Assessment Patient needs continued PT services  PT Problem List Decreased strength;Decreased activity tolerance;Decreased balance;Decreased mobility       PT Treatment Interventions Gait training;Functional mobility training;Therapeutic activities;Therapeutic exercise;Patient/family education;Stair training    PT Goals (Current goals can be found in the Care Plan section)  Acute Rehab PT Goals Patient Stated Goal: return home with family to assist PT Goal Formulation: With patient Time For Goal Achievement: 01/04/18 Potential to Achieve Goals: Good    Frequency Min 3X/week   Barriers to discharge        Co-evaluation               AM-PAC PT "6 Clicks" Daily Activity  Outcome Measure Difficulty turning over in bed (including adjusting bedclothes, sheets and blankets)?: None Difficulty moving from lying on back to sitting on the side of the bed? : A Little Difficulty sitting down on and standing up from a chair with arms (e.g., wheelchair, bedside commode, etc,.)?: A Little Help needed moving to and from a bed to chair (including a wheelchair)?: None Help needed walking in hospital room?: A Little Help needed climbing 3-5 steps with a railing? : A Lot 6 Click Score: 19    End of Session   Activity Tolerance: Patient tolerated treatment well;Patient limited by fatigue Patient left: in chair;with call bell/phone within reach Nurse Communication:  Mobility status;Other (comment)(RN aware patient left up in chair) PT Visit Diagnosis: Unsteadiness on feet (R26.81);Other abnormalities of gait and mobility (R26.89);Muscle weakness (generalized) (M62.81)    Time: 1062-6948 PT Time Calculation (min) (ACUTE ONLY): 31 min   Charges:   PT Evaluation $PT Eval Moderate Complexity: 1 Mod PT Treatments $Therapeutic Activity: 23-37 mins   PT G Codes:        1:48 PM, Jan 19, 2018 Lonell Grandchild, MPT Physical Therapist with [facility] [phone] office [phone] mobile phone

## 2017-12-28 NOTE — Clinical Social Work Note (Signed)
Patient states that she lives alone, cooks, washes, gives herself baths, and does her laundry. Patient ambulates with a walker. Patient's son gets her groceries and medications and her grandson cleans for her.  Patient discussed that she only receives around $1400/month and does not have Medicaid or savings that she could utilize to cover the cost of ALF. LCSW offered to contact son to discuss issue. Patient declined LCSW calling son to discuss.  Patient stated that she would go home and that Dr. Luan Pulling would have a nurse coming in for six weeks.   LCSW signing off.     Susan Glass, [name], LCSW

## 2017-12-28 NOTE — Care Management Obs Status (Signed)
[place] NOTIFICATION   Patient Details  Name: AALIYHA MUMFORD MRN: [id number] Date of Birth: [birth year]   Medicare Observation Status Notification Given:  Yes    Oluwatomiwa Kinyon, Chauncey Reading, RN 12/28/2017, 12:15 PM

## 2017-12-28 NOTE — Progress Notes (Signed)
ANTICOAGULATION CONSULT NOTE - Initial Consult  Pharmacy Consult for heparin Indication: chest pain/ACS  No Known Allergies  Patient Measurements: Height: 5' (152.4 cm) Weight: 125 lb (56.7 kg) IBW/kg (Calculated) : 45.5 HEPARIN DW (KG): 56.7  Vital Signs: Temp: 97.5 F (36.4 C) (05/16 1400) Temp Source: Oral (05/16 1400) BP: 147/72 (05/16 1429) Pulse Rate: 71 (05/16 1429)  Labs: Recent Labs    12/27/17 1745 12/28/17 0621  HGB 14.1 13.2  HCT 42.5 40.4  PLT 203 202  CREATININE 0.96 0.67  TROPONINI <0.03  --     Estimated Creatinine Clearance: 30.3 mL/min (by C-G formula based on SCr of 0.67 mg/dL).   Medical History: Past Medical History:  Diagnosis Date  . CAD (coronary artery disease)   . CHF (congestive heart failure) ([place])   . GERD (gastroesophageal reflux disease)   . GI bleed    UGI  . Hiatal hernia    large, sliding   . Hyperlipidemia    hypercholesterolemia  . Hypertension     Medications:  Medications Prior to Admission  Medication Sig Dispense Refill Last Dose  . acetaminophen (TYLENOL) 500 MG tablet Take 500 mg by mouth every 4 (four) hours.   12/27/2017 at Unknown time  . ALPRAZolam (XANAX) 0.5 MG tablet Take 0.5 mg by mouth at bedtime.    12/26/2017 at Unknown time  . amLODipine (NORVASC) 2.5 MG tablet TAKE 1 TABLET BY MOUTH ONCE DAILY 90 tablet 3 12/27/2017 at Unknown time  . ferrous sulfate (FERROUSUL) 325 (65 FE) MG tablet Take 1 tablet (325 mg total) by mouth daily with breakfast. 30 tablet 3 12/27/2017 at Unknown time  . furosemide (LASIX) 80 MG tablet TAKE ONE-HALF TABLET BY MOUTH ONCE DAILY 45 tablet 4 12/27/2017 at Unknown time  . Omega-3 Fatty Acids (FISH OIL) 1000 MG CAPS Take 1 capsule by mouth daily.     12/27/2017 at Unknown time  . pantoprazole (PROTONIX) 40 MG tablet Take 1 tablet (40 mg total) by mouth daily. 90 tablet 3 12/27/2017 at Unknown time    Assessment: 84 yo admitted with acute encephalopathy and nausea. Patient complaining  of chest pain that is radiating into the back. Nitro SL given. Pharmacy asked to start heparin  Goal of Therapy:  Heparin level 0.3-0.7 units/ml Monitor platelets by anticoagulation protocol: Yes   Plan:  Give 3500 units bolus x 1 Start heparin infusion at 700 units/hr Check anti-Xa level in 6-8 hours and daily while on heparin Continue to monitor H&H and platelets  Isac Sarna, BS Vena Austria, BCPS Clinical Pharmacist Pager [phone] 12/28/2017,2:34 PM

## 2017-12-28 NOTE — Care Management Note (Signed)
Case Management Note  Patient Details  Name: Susan Glass MRN: [id number] Date of Birth: 06-Oct-1916  Subjective/Objective:   Adm with acute encephalopathy. Met with patient earlier this morning. She is from home alone. Has RW at home. Seen by PT, recommended for [facility] PT. She is reluctant to agree because she states she thinks she needs rehab/ALF. Updated patient that if she goes home or to an ALF, PT could be arranged at either one. She reports she wants to "think about it"  Noted LCSW note that patient declines help with ALF placement and declines for talk with son.                Action/Plan: Anticipate DC with HH PT at some venue.   Expected Discharge Date:   12/29/2017               Expected Discharge Plan:  Assisted Living / Rest Home  In-House Referral:  Clinical Social Work  Discharge planning Services  CM Consult  Post Acute Care Choice:  Home Health Choice offered to:  Patient  DME Arranged:    DME Agency:     HH Arranged:    [place] Agency:     Status of Service:  In process, will continue to follow  If discussed at Long Length of Stay Meetings, dates discussed:    Additional Comments:  Trinidi Toppins, Chauncey Reading, RN 12/28/2017, 1:03 PM

## 2017-12-28 NOTE — Plan of Care (Signed)
  Problem: Acute Rehab PT Goals(only PT should resolve) Goal: Pt Will Go Supine/Side To Sit Outcome: Progressing Flowsheets (Taken 12/28/2017 1350) Pt will go Supine/Side to Sit: with modified independence Goal: Pt Will Go Sit To Supine/Side Outcome: Progressing Flowsheets (Taken 12/28/2017 1350) Pt will go Sit to Supine/Side: with modified independence Goal: Patient Will Transfer Sit To/From Stand Outcome: Progressing Flowsheets (Taken 12/28/2017 1350) Patient will transfer sit to/from stand: with supervision Goal: Pt Will Transfer Bed To Chair/Chair To Bed Outcome: Progressing Flowsheets (Taken 12/28/2017 1350) Pt will Transfer Bed to Chair/Chair to Bed: with modified independence Goal: Pt Will Ambulate Outcome: Progressing Flowsheets (Taken 12/28/2017 1350) Pt will Ambulate: with modified independence;with standard walker;50 feet  1:51 PM, 12/28/17 Lonell Grandchild, MPT Physical Therapist with [facility] [phone] office [phone] mobile phone

## 2017-12-28 NOTE — Progress Notes (Signed)
I was on the floor visiting another patient when her nurse called me and stated that MS. Mcphillips was having chest pain.  When I went into the room she looked pale had her eyes closed clutching her chest.  She told me that she was having chest pain.  She had nitroglycerin but it caused her blood pressure dropped into the 70s.  It did not seem to help her pain.  She said the pain went up into her shoulders and back.  She says she is been having this for several weeks now but did not want to tell her family.  She was given morphine and a bolus of fluid.  She had an EKG that showed lateral ischemic looking changes but not an acute MI.  Lab work including troponin is pending.  I am concerned that she has acute coronary syndrome.  She is of course 84 years old and does not want "aggressive measures".  She does agree to heparin morphine IV fluids and nitroglycerin patch.  She still looks very uncomfortable.  Her daughter-in-law was at bedside while this was going on and I discussed the situation with her and I have discussed now the situation with her son.  They understand the severity of the situation I told them frankly that I am concerned that she will die from this.  I did not request cardiology consult because of the limitations of care.

## 2017-12-28 NOTE — Progress Notes (Signed)
Subjective: She says she feels better but she is very weak.  No other complaints.  No nausea now.  Objective: Vital signs in last 24 hours: Temp:  [97.3 F (36.3 C)-98.6 F (37 C)] 97.8 F (36.6 C) (05/16 0439) Pulse Rate:  [63-83] 63 (05/16 0439) Resp:  [18-19] 19 (05/15 2130) BP: (133-166)/(67-96) 133/67 (05/16 0439) SpO2:  [95 %-100 %] 98 % (05/16 0439) Weight:  [56.7 kg (125 lb)] 56.7 kg (125 lb) (05/15 1752) Weight change:  Last BM Date: 12/26/17  Intake/Output from previous day: 05/15 0701 - 05/16 0700 In: 340 [P.O.:240; IV Piggyback:100] Out: -   PHYSICAL EXAM General appearance: alert, cooperative and no distress Resp: clear to auscultation bilaterally Cardio: regular rate and rhythm, S1, S2 normal, no murmur, click, rub or gallop GI: soft, non-tender; bowel sounds normal; no masses,  no organomegaly Extremities: extremities normal, atraumatic, no cyanosis or edema  Lab Results:  Results for orders placed or performed during the hospital encounter of 12/27/17 (from the past 48 hour(s))  Comprehensive metabolic panel     Status: Abnormal   Collection Time: 12/27/17  5:45 PM  Result Value Ref Range   Sodium 134 (L) 135 - 145 mmol/L   Potassium 4.5 3.5 - 5.1 mmol/L   Chloride 100 (L) 101 - 111 mmol/L   CO2 25 22 - 32 mmol/L   Glucose, Bld 104 (H) 65 - 99 mg/dL   BUN 26 (H) 6 - 20 mg/dL   Creatinine, Ser 0.96 0.44 - 1.00 mg/dL   Calcium 9.4 8.9 - 10.3 mg/dL   Total Protein 7.3 6.5 - 8.1 g/dL   Albumin 4.3 3.5 - 5.0 g/dL   AST 18 15 - 41 U/L   ALT 12 (L) 14 - 54 U/L   Alkaline Phosphatase 81 38 - 126 U/L   Total Bilirubin 0.8 0.3 - 1.2 mg/dL   GFR calc non Af Amer 47 (L) >60 mL/min   GFR calc Af Amer 55 (L) >60 mL/min    Comment: (NOTE) The eGFR has been calculated using the CKD EPI equation. This calculation has not been validated in all clinical situations. eGFR's persistently <60 mL/min signify possible Chronic Kidney Disease.    Anion gap 9 5 - 15   Comment: Performed at [facility], [address] [place], [place] [address]  CBC with Differential     Status: Abnormal   Collection Time: 12/27/17  5:45 PM  Result Value Ref Range   WBC 14.0 (H) 4.0 - 10.5 K/uL   RBC 4.50 3.87 - 5.11 MIL/uL   Hemoglobin 14.1 12.0 - 15.0 g/dL   HCT 42.5 36.0 - 46.0 %   MCV 94.4 78.0 - 100.0 fL   MCH 31.3 26.0 - 34.0 pg   MCHC 33.2 30.0 - 36.0 g/dL   RDW 13.8 11.5 - 15.5 %   Platelets 203 150 - 400 K/uL   Neutrophils Relative % 78 %   Neutro Abs 10.8 (H) 1.7 - 7.7 K/uL   Lymphocytes Relative 14 %   Lymphs Abs 2.0 0.7 - 4.0 K/uL   Monocytes Relative 7 %   Monocytes Absolute 1.0 0.1 - 1.0 K/uL   Eosinophils Relative 1 %   Eosinophils Absolute 0.2 0.0 - 0.7 K/uL   Basophils Relative 0 %   Basophils Absolute 0.0 0.0 - 0.1 K/uL    Comment: Performed at [facility], [address], [place],  [address]  Lipase, blood     Status: None   Collection Time: 12/27/17  5:45 PM  Result Value Ref Range   Lipase 48 11 - 51 U/L    Comment: Performed at [facility], [address], [place], [place] [address]  Brain natriuretic peptide     Status: Abnormal   Collection Time: 12/27/17  5:45 PM  Result Value Ref Range   B Natriuretic Peptide 194.0 (H) 0.0 - 100.0 pg/mL    Comment: Performed at [facility], [address], [place], [place] [address]  Troponin I     Status: None   Collection Time: 12/27/17  5:45 PM  Result Value Ref Range   Troponin I <0.03 <0.03 ng/mL    Comment: Performed at [facility], [address] [place], [place] [address]  I-Stat CG4 Lactic Acid, ED     Status: None   Collection Time: 12/27/17  6:34 PM  Result Value Ref Range   Lactic Acid, Venous 1.64 0.5 - 1.9 mmol/L  Urinalysis, Routine w reflex microscopic     Status: Abnormal   Collection Time: 12/27/17  8:35 PM  Result Value Ref Range   Color, Urine YELLOW YELLOW   APPearance HAZY (A) CLEAR   Specific Gravity, Urine 1.010 1.005 - 1.030   pH 5.0 5.0 - 8.0   Glucose,  UA NEGATIVE NEGATIVE mg/dL   Hgb urine dipstick NEGATIVE NEGATIVE   Bilirubin Urine NEGATIVE NEGATIVE   Ketones, ur NEGATIVE NEGATIVE mg/dL   Protein, ur NEGATIVE NEGATIVE mg/dL   Nitrite POSITIVE (A) NEGATIVE   Leukocytes, UA MODERATE (A) NEGATIVE   RBC / HPF 0-5 0 - 5 RBC/hpf   WBC, UA 21-50 0 - 5 WBC/hpf   Bacteria, UA MANY (A) NONE SEEN   Squamous Epithelial / LPF 0-5 0 - 5   Mucus PRESENT     Comment: Performed at [facility], [address], [place], [place] [address]  TSH     Status: None   Collection Time: 12/27/17  9:19 PM  Result Value Ref Range   TSH 0.818 0.350 - 4.500 uIU/mL    Comment: Performed by a 3rd Generation assay with a functional sensitivity of <=0.01 uIU/mL. Performed at [facility], [address], [place], [place] [address]   Ammonia     Status: Abnormal   Collection Time: 12/27/17  9:19 PM  Result Value Ref Range   Ammonia <9 (L) 9 - 35 umol/L    Comment: Performed at [facility], [address], [place], [place] [address]  Basic metabolic panel     Status: Abnormal   Collection Time: 12/28/17  6:21 AM  Result Value Ref Range   Sodium 137 135 - 145 mmol/L   Potassium 3.7 3.5 - 5.1 mmol/L    Comment: DELTA CHECK NOTED   Chloride 103 101 - 111 mmol/L   CO2 26 22 - 32 mmol/L   Glucose, Bld 124 (H) 65 - 99 mg/dL   BUN 22 (H) 6 - 20 mg/dL   Creatinine, Ser 0.67 0.44 - 1.00 mg/dL   Calcium 8.8 (L) 8.9 - 10.3 mg/dL   GFR calc non Af Amer >60 >60 mL/min   GFR calc Af Amer >60 >60 mL/min    Comment: (NOTE) The eGFR has been calculated using the CKD EPI equation. This calculation has not been validated in all clinical situations. eGFR's persistently <60 mL/min signify possible Chronic Kidney Disease.    Anion gap 8 5 - 15    Comment: Performed at [facility], [address] [place], [place] [address]  CBC WITH DIFFERENTIAL  Status: None   Collection Time: 12/28/17  6:21 AM  Result Value Ref Range   WBC 6.9 4.0 - 10.5 K/uL   RBC 4.26 3.87 - 5.11  MIL/uL   Hemoglobin 13.2 12.0 - 15.0 g/dL   HCT 40.4 36.0 - 46.0 %   MCV 94.8 78.0 - 100.0 fL   MCH 31.0 26.0 - 34.0 pg   MCHC 32.7 30.0 - 36.0 g/dL   RDW 13.9 11.5 - 15.5 %   Platelets 202 150 - 400 K/uL   Neutrophils Relative % 65 %   Neutro Abs 4.5 1.7 - 7.7 K/uL   Lymphocytes Relative 25 %   Lymphs Abs 1.7 0.7 - 4.0 K/uL   Monocytes Relative 8 %   Monocytes Absolute 0.5 0.1 - 1.0 K/uL   Eosinophils Relative 2 %   Eosinophils Absolute 0.2 0.0 - 0.7 K/uL   Basophils Relative 0 %   Basophils Absolute 0.0 0.0 - 0.1 K/uL    Comment: Performed at [facility], [address], [place], [place] [address]    ABGS No results for input(s): PHART, PO2ART, TCO2, HCO3 in the last 72 hours.  Invalid input(s): PCO2 CULTURES No results found for this or any previous visit (from the past 240 hour(s)). Studies/Results: Dg Chest 2 View  Result Date: 12/27/2017 CLINICAL DATA:  Altered mental status, tachypnea. EXAM: CHEST - 2 VIEW COMPARISON:  Chest radiograph October 19, 2009 FINDINGS: Cardiomediastinal silhouette is cardiac silhouette is mildly enlarged. Tortuous calcified aorta. Moderate hiatal hernia. Mild bronchitic changes without pleural effusion focal consolidation. No pneumothorax. Chronic deformity bilateral shoulders. Osteopenia. Surgical clips in the included right abdomen compatible with cholecystectomy. IMPRESSION: Mild cardiomegaly.  Mild bronchitic changes. Aortic Atherosclerosis (ICD10-I70.0). Electronically Signed   By: Elon Alas M.D.   On: 12/27/2017 20:04   Ct Head Wo Contrast  Result Date: 12/27/2017 CLINICAL DATA:  Altered level of consciousness. History of hypertension, hyperlipidemia. EXAM: CT HEAD WITHOUT CONTRAST TECHNIQUE: Contiguous axial images were obtained from the base of the skull through the vertex without intravenous contrast. COMPARISON:  None. FINDINGS: BRAIN: No intraparenchymal hemorrhage, mass effect nor midline shift. The ventricles and sulci are normal  for age. Patchy supratentorial white matter hypodensities within normal range for patient's age, though non-specific are most compatible with chronic small vessel ischemic disease. Old LEFT basal ganglia lacunar infarct. No acute large vascular territory infarcts. No abnormal extra-axial fluid collections. Basal cisterns are patent. VASCULAR: Moderate calcific atherosclerosis of the carotid siphons. SKULL: No skull fracture. Osteopenia. No significant scalp soft tissue swelling. SINUSES/ORBITS: Lobulated paranasal sinus mucosal thickening. Trace LEFT mastoid effusion.The included ocular globes and orbital contents are non-suspicious. Status post bilateral ocular lens implants. OTHER: None. IMPRESSION: 1. No acute intracranial process. 2. Old LEFT basal ganglia lacunar infarct, otherwise negative noncontrast CT HEAD for age. Electronically Signed   By: Elon Alas M.D.   On: 12/27/2017 19:52    Medications:  Prior to Admission:  Medications Prior to Admission  Medication Sig Dispense Refill Last Dose  . acetaminophen (TYLENOL) 500 MG tablet Take 500 mg by mouth every 4 (four) hours.   12/27/2017 at Unknown time  . ALPRAZolam (XANAX) 0.5 MG tablet Take 0.5 mg by mouth at bedtime.    12/26/2017 at Unknown time  . amLODipine (NORVASC) 2.5 MG tablet TAKE 1 TABLET BY MOUTH ONCE DAILY 90 tablet 3 12/27/2017 at Unknown time  . ferrous sulfate (FERROUSUL) 325 (65 FE) MG tablet Take 1 tablet (325 mg total) by mouth daily with breakfast.  30 tablet 3 12/27/2017 at Unknown time  . furosemide (LASIX) 80 MG tablet TAKE ONE-HALF TABLET BY MOUTH ONCE DAILY 45 tablet 4 12/27/2017 at Unknown time  . Omega-3 Fatty Acids (FISH OIL) 1000 MG CAPS Take 1 capsule by mouth daily.     12/27/2017 at Unknown time  . pantoprazole (PROTONIX) 40 MG tablet Take 1 tablet (40 mg total) by mouth daily. 90 tablet 3 12/27/2017 at Unknown time   Scheduled: . amLODipine  2.5 mg Oral Daily  . furosemide  120 mg Oral Daily  . heparin  5,000  Units Subcutaneous Q8H  . omega-3 acid ethyl esters  1 g Oral Daily  . pantoprazole  40 mg Oral Daily  . sodium chloride flush  3 mL Intravenous Q12H  . sodium chloride flush  3 mL Intravenous Q12H   Continuous: . sodium chloride    . cefTRIAXone (ROCEPHIN)  IV Stopped (12/28/17 0205)   MBW:GYKZLD chloride, acetaminophen **OR** acetaminophen, ALPRAZolam, bisacodyl, HYDROcodone-acetaminophen, ondansetron **OR** ondansetron (ZOFRAN) IV, senna-docusate, sodium chloride flush  Assesment: She was admitted with acute encephalopathy and nausea.  It may have been some version of food poisoning.  She is still very weak. Principal Problem:   Acute encephalopathy Active Problems:   Essential hypertension   CAD, NATIVE VESSEL   COMBINED HEART FAILURE, CHRONIC    Plan: PT consultation is underway.  She has not eaten anything yet.  Not ready for discharge yet    LOS: 0 days   Jeremaih Klima L 12/28/2017, 8:59 AM

## 2017-12-28 NOTE — Progress Notes (Signed)
**Note De-Identified  Obfuscation** EKG complete; results reported to DR. Hawkins and placed in patient chart

## 2017-12-28 NOTE — Progress Notes (Signed)
Dr. Luan Pulling ordered a bolus of fluid 500 CC to start. IV bag hanging and running to gravity.

## 2017-12-28 NOTE — Progress Notes (Addendum)
Patient complains of chest pain that radiates into the back. Nitro given sublingual, Vitals taken, HR is elevated. Patient has nausea as well.  Patient states she has had this pain before. Daughter in law is at bedside, states she was like this at home. Dr. Luan Pulling made aware, Dr. Luan Pulling is on floor and is coming to see patient.

## 2017-12-29 LAB — CBC
HCT: 39.6 % (ref 36.0–46.0)
HEMOGLOBIN: 13.2 g/dL (ref 12.0–15.0)
MCH: 31.8 pg (ref 26.0–34.0)
MCHC: 33.3 g/dL (ref 30.0–36.0)
MCV: 95.4 fL (ref 78.0–100.0)
PLATELETS: 199 10*3/uL (ref 150–400)
RBC: 4.15 MIL/uL (ref 3.87–5.11)
RDW: 14 % (ref 11.5–15.5)
WBC: 7.9 10*3/uL (ref 4.0–10.5)

## 2017-12-29 LAB — RPR: RPR Ser Ql: NONREACTIVE

## 2017-12-29 LAB — HEPARIN LEVEL (UNFRACTIONATED): HEPARIN UNFRACTIONATED: 0.58 [IU]/mL (ref 0.30–0.70)

## 2017-12-29 LAB — TROPONIN I

## 2017-12-29 NOTE — Progress Notes (Signed)
Subjective: She says she feels better.  Much less pain in her chest.  No other new complaints.  Objective: Vital signs in last 24 hours: Temp:  [97.5 F (36.4 C)-98.8 F (37.1 C)] 98.1 F (36.7 C) (05/17 0800) Pulse Rate:  [64-84] 67 (05/17 0600) Resp:  [9-33] 16 (05/17 0600) BP: (83-152)/(53-85) 148/77 (05/17 0600) SpO2:  [95 %-100 %] 100 % (05/17 0600) FiO2 (%):  [28 %] 28 % (05/16 1509) Weight:  [56.1 kg (123 lb 10.9 oz)-56.3 kg (124 lb 1.9 oz)] 56.3 kg (124 lb 1.9 oz) (05/17 0500) Weight change: -0.6 kg (-1 lb 5.2 oz) Last BM Date: 12/27/17  Intake/Output from previous day: 05/16 0701 - 05/17 0700 In: 214.2 [I.V.:114.2; IV Piggyback:100] Out: 1400 [Urine:1400]  PHYSICAL EXAM General appearance: alert, cooperative and no distress Resp: clear to auscultation bilaterally Cardio: regular rate and rhythm, S1, S2 normal, no murmur, click, rub or gallop GI: soft, non-tender; bowel sounds normal; no masses,  no organomegaly Extremities: extremities normal, atraumatic, no cyanosis or edema Hard of hearing and her hearing aid is not working so communication is difficult  Lab Results:  Results for orders placed or performed during the hospital encounter of 12/27/17 (from the past 48 hour(s))  Comprehensive metabolic panel     Status: Abnormal   Collection Time: 12/27/17  5:45 PM  Result Value Ref Range   Sodium 134 (L) 135 - 145 mmol/L   Potassium 4.5 3.5 - 5.1 mmol/L   Chloride 100 (L) 101 - 111 mmol/L   CO2 25 22 - 32 mmol/L   Glucose, Bld 104 (H) 65 - 99 mg/dL   BUN 26 (H) 6 - 20 mg/dL   Creatinine, Ser 0.96 0.44 - 1.00 mg/dL   Calcium 9.4 8.9 - 10.3 mg/dL   Total Protein 7.3 6.5 - 8.1 g/dL   Albumin 4.3 3.5 - 5.0 g/dL   AST 18 15 - 41 U/L   ALT 12 (L) 14 - 54 U/L   Alkaline Phosphatase 81 38 - 126 U/L   Total Bilirubin 0.8 0.3 - 1.2 mg/dL   GFR calc non Af Amer 47 (L) >60 mL/min   GFR calc Af Amer 55 (L) >60 mL/min    Comment: (NOTE) The eGFR has been calculated  using the CKD EPI equation. This calculation has not been validated in all clinical situations. eGFR's persistently <60 mL/min signify possible Chronic Kidney Disease.    Anion gap 9 5 - 15    Comment: Performed at [facility], [address], [place], [place] [address]  CBC with Differential     Status: Abnormal   Collection Time: 12/27/17  5:45 PM  Result Value Ref Range   WBC 14.0 (H) 4.0 - 10.5 K/uL   RBC 4.50 3.87 - 5.11 MIL/uL   Hemoglobin 14.1 12.0 - 15.0 g/dL   HCT 42.5 36.0 - 46.0 %   MCV 94.4 78.0 - 100.0 fL   MCH 31.3 26.0 - 34.0 pg   MCHC 33.2 30.0 - 36.0 g/dL   RDW 13.8 11.5 - 15.5 %   Platelets 203 150 - 400 K/uL   Neutrophils Relative % 78 %   Neutro Abs 10.8 (H) 1.7 - 7.7 K/uL   Lymphocytes Relative 14 %   Lymphs Abs 2.0 0.7 - 4.0 K/uL   Monocytes Relative 7 %   Monocytes Absolute 1.0 0.1 - 1.0 K/uL   Eosinophils Relative 1 %   Eosinophils Absolute 0.2 0.0 - 0.7 K/uL   Basophils Relative 0 %  Basophils Absolute 0.0 0.0 - 0.1 K/uL    Comment: Performed at [facility], [address], [place], [place] [address]  Lipase, blood     Status: None   Collection Time: 12/27/17  5:45 PM  Result Value Ref Range   Lipase 48 11 - 51 U/L    Comment: Performed at [facility], [address] [place], [place] [address]  Brain natriuretic peptide     Status: Abnormal   Collection Time: 12/27/17  5:45 PM  Result Value Ref Range   B Natriuretic Peptide 194.0 (H) 0.0 - 100.0 pg/mL    Comment: Performed at [facility], [address], [place], [place] [address]  Troponin I     Status: None   Collection Time: 12/27/17  5:45 PM  Result Value Ref Range   Troponin I <0.03 <0.03 ng/mL    Comment: Performed at [facility], [address], [place], [place] [address]  I-Stat CG4 Lactic Acid, ED     Status: None   Collection Time: 12/27/17  6:34 PM  Result Value Ref Range   Lactic Acid, Venous 1.64 0.5 - 1.9 mmol/L  Urinalysis, Routine w reflex microscopic     Status: Abnormal    Collection Time: 12/27/17  8:35 PM  Result Value Ref Range   Color, Urine YELLOW YELLOW   APPearance HAZY (A) CLEAR   Specific Gravity, Urine 1.010 1.005 - 1.030   pH 5.0 5.0 - 8.0   Glucose, UA NEGATIVE NEGATIVE mg/dL   Hgb urine dipstick NEGATIVE NEGATIVE   Bilirubin Urine NEGATIVE NEGATIVE   Ketones, ur NEGATIVE NEGATIVE mg/dL   Protein, ur NEGATIVE NEGATIVE mg/dL   Nitrite POSITIVE (A) NEGATIVE   Leukocytes, UA MODERATE (A) NEGATIVE   RBC / HPF 0-5 0 - 5 RBC/hpf   WBC, UA 21-50 0 - 5 WBC/hpf   Bacteria, UA MANY (A) NONE SEEN   Squamous Epithelial / LPF 0-5 0 - 5   Mucus PRESENT     Comment: Performed at [facility], [address] [place], [place] [address]  TSH     Status: None   Collection Time: 12/27/17  9:19 PM  Result Value Ref Range   TSH 0.818 0.350 - 4.500 uIU/mL    Comment: Performed by a 3rd Generation assay with a functional sensitivity of <=0.01 uIU/mL. Performed at [facility], [address], [place], [place] [address]   Ammonia     Status: Abnormal   Collection Time: 12/27/17  9:19 PM  Result Value Ref Range   Ammonia <9 (L) 9 - 35 umol/L    Comment: Performed at [facility], [address], [place], [place] [address]  Vitamin B12     Status: Abnormal   Collection Time: 12/27/17  9:19 PM  Result Value Ref Range   Vitamin B-12 123 (L) 180 - 914 pg/mL    Comment: (NOTE) This assay is not validated for testing neonatal or myeloproliferative syndrome specimens for Vitamin B12 levels. Performed at [facility], [place] [address] [place], [place] [address]   RPR     Status: None   Collection Time: 12/27/17  9:19 PM  Result Value Ref Range   RPR Ser Ql Non Reactive Non Reactive    Comment: (NOTE) Performed At: [facility] [place], [place] [id number] Rush Farmer MD NU:[id number] Performed at [facility], [address] [place],  [address]   Basic metabolic panel     Status: Abnormal   Collection Time: 12/28/17  6:21 AM  Result Value Ref Range   Sodium 137 135 - 145 mmol/L   Potassium 3.7 3.5 - 5.1 mmol/L    Comment: DELTA CHECK NOTED   Chloride 103 101 - 111 mmol/L   CO2 26 22 - 32 mmol/L   Glucose, Bld 124 (H) 65 - 99 mg/dL   BUN 22 (H) 6 - 20 mg/dL   Creatinine, Ser 0.67 0.44 - 1.00 mg/dL   Calcium 8.8 (L) 8.9 - 10.3 mg/dL   GFR calc non Af Amer >60 >60 mL/min   GFR calc Af Amer >60 >60 mL/min    Comment: (NOTE) The eGFR has been calculated using the CKD EPI equation. This calculation has not been validated in all clinical situations. eGFR's persistently <60 mL/min signify possible Chronic Kidney Disease.    Anion gap 8 5 - 15    Comment: Performed at [facility], [address], [place], [place] [address]  CBC WITH DIFFERENTIAL     Status: None   Collection Time: 12/28/17  6:21 AM  Result Value Ref Range   WBC 6.9 4.0 - 10.5 K/uL   RBC 4.26 3.87 - 5.11 MIL/uL   Hemoglobin 13.2 12.0 - 15.0 g/dL   HCT 40.4 36.0 - 46.0 %   MCV 94.8 78.0 - 100.0 fL   MCH 31.0 26.0 - 34.0 pg   MCHC 32.7 30.0 - 36.0 g/dL   RDW 13.9 11.5 - 15.5 %   Platelets 202 150 - 400 K/uL   Neutrophils Relative % 65 %   Neutro Abs 4.5 1.7 - 7.7 K/uL   Lymphocytes Relative 25 %   Lymphs Abs 1.7 0.7 - 4.0 K/uL   Monocytes Relative 8 %   Monocytes Absolute 0.5 0.1 - 1.0 K/uL   Eosinophils Relative 2 %   Eosinophils Absolute 0.2 0.0 - 0.7 K/uL   Basophils Relative 0 %   Basophils Absolute 0.0 0.0 - 0.1 K/uL    Comment: Performed at [facility], [address] [place], [place] [address]  MRSA PCR Screening     Status: None   Collection Time: 12/28/17  2:51 PM  Result Value Ref Range   MRSA by PCR NEGATIVE NEGATIVE    Comment:        The GeneXpert MRSA Assay (FDA approved for NASAL specimens only), is one component of a comprehensive MRSA colonization surveillance program. It is not intended to diagnose MRSA infection nor to guide or monitor treatment for MRSA infections. Performed at [facility], [address] [place] [address] [place], [place] [address]   Troponin I (q 6hr x 3)     Status: None   Collection Time: 12/28/17  3:01 PM  Result Value Ref Range   Troponin I <0.03 <0.03 ng/mL    Comment: Performed at [facility], [address] [place], [place] [address]  Lipase, blood     Status: None   Collection Time: 12/28/17  3:01 PM  Result Value Ref Range   Lipase 32 11 - 51 U/L    Comment: Performed at [facility], [address], [place], [place] [address]  Basic metabolic panel     Status: Abnormal   Collection Time: 12/28/17  3:01 PM  Result Value Ref Range   Sodium 139 135 - 145 mmol/L   Potassium 4.1 3.5 - 5.1 mmol/L   Chloride 103 101 - 111 mmol/L   CO2 23 22 - 32 mmol/L   Glucose, Bld 102 (H) 65 - 99 mg/dL   BUN 25 (H)  6 - 20 mg/dL   Creatinine, Ser 0.86 0.44 - 1.00 mg/dL   Calcium 9.1 8.9 - 10.3 mg/dL   GFR calc non Af Amer 54 (L) >60 mL/min   GFR calc Af Amer >60 >60 mL/min    Comment: (NOTE) The eGFR has been calculated using the CKD EPI equation. This calculation has not been validated in all clinical situations. eGFR's persistently <60 mL/min signify possible Chronic Kidney Disease.    Anion gap 13 5 - 15    Comment: Performed at [facility], [address], [place], [place] [address]  CBC with Differential/Platelet     Status: None   Collection Time: 12/28/17  3:01 PM  Result Value Ref Range   WBC 9.4 4.0 - 10.5 K/uL   RBC 4.57 3.87 - 5.11 MIL/uL   Hemoglobin 14.1 12.0 - 15.0 g/dL   HCT 43.1 36.0 - 46.0 %   MCV 94.3 78.0 - 100.0 fL   MCH 30.9 26.0 - 34.0 pg   MCHC 32.7 30.0 - 36.0 g/dL   RDW 14.0 11.5 - 15.5 %   Platelets 202 150 - 400 K/uL   Neutrophils Relative % 62 %   Neutro Abs 5.8 1.7 - 7.7 K/uL   Lymphocytes Relative 27 %   Lymphs Abs 2.5 0.7 - 4.0 K/uL   Monocytes Relative 10 %   Monocytes Absolute 0.9 0.1 - 1.0 K/uL   Eosinophils Relative 1 %   Eosinophils Absolute 0.1 0.0 - 0.7 K/uL   Basophils Relative 0 %   Basophils Absolute 0.0 0.0 - 0.1  K/uL    Comment: Performed at [facility], [address] [place], [place] [address]  Protime-INR     Status: None   Collection Time: 12/28/17  3:01 PM  Result Value Ref Range   Prothrombin Time 14.1 11.4 - 15.2 seconds   INR 1.10     Comment: Performed at [facility], [address], [place], [place] [address]  Troponin I (q 6hr x 3)     Status: None   Collection Time: 12/28/17  8:53 PM  Result Value Ref Range   Troponin I <0.03 <0.03 ng/mL    Comment: Performed at [facility], [address] [place], [place] [address]  Heparin level (unfractionated)     Status: None   Collection Time: 12/28/17  8:53 PM  Result Value Ref Range   Heparin Unfractionated 0.60 0.30 - 0.70 IU/mL    Comment: (NOTE) If heparin results are below expected values, and patient dosage has  been confirmed, suggest follow up testing of antithrombin III levels. Performed at [facility], [address], [place], [place] [address]   Troponin I (q 6hr x 3)     Status: None   Collection Time: 12/29/17  3:51 AM  Result Value Ref Range   Troponin I <0.03 <0.03 ng/mL    Comment: Performed at [facility], [address] [place], [place] [address]  Heparin level (unfractionated)     Status: None   Collection Time: 12/29/17  3:51 AM  Result Value Ref Range   Heparin Unfractionated 0.58 0.30 - 0.70 IU/mL    Comment: (NOTE) If heparin results are below expected values, and patient dosage has  been confirmed, suggest follow up testing of antithrombin III levels. Performed at [facility], [address] [place],  [address]   CBC     Status: None   Collection Time: 12/29/17  3:51 AM  Result Value Ref Range   WBC 7.9 4.0 -  10.5 K/uL   RBC 4.15 3.87 - 5.11 MIL/uL   Hemoglobin 13.2 12.0 - 15.0 g/dL   HCT 39.6 36.0 - 46.0 %   MCV 95.4 78.0 - 100.0 fL   MCH 31.8 26.0 - 34.0 pg   MCHC 33.3 30.0 - 36.0 g/dL   RDW 14.0 11.5 - 15.5 %   Platelets 199 150 - 400 K/uL    Comment: Performed at [facility],  [address], [place], [place] [address]    ABGS No results for input(s): PHART, PO2ART, TCO2, HCO3 in the last 72 hours.  Invalid input(s): PCO2 CULTURES Recent Results (from the past 240 hour(s))  MRSA PCR Screening     Status: None   Collection Time: 12/28/17  2:51 PM  Result Value Ref Range Status   MRSA by PCR NEGATIVE NEGATIVE Final    Comment:        The GeneXpert MRSA Assay (FDA approved for NASAL specimens only), is one component of a comprehensive MRSA colonization surveillance program. It is not intended to diagnose MRSA infection nor to guide or monitor treatment for MRSA infections. Performed at [facility], [address], [place], [place] [address]    Studies/Results: Dg Chest 2 View  Result Date: 12/27/2017 CLINICAL DATA:  Altered mental status, tachypnea. EXAM: CHEST - 2 VIEW COMPARISON:  Chest radiograph October 19, 2009 FINDINGS: Cardiomediastinal silhouette is cardiac silhouette is mildly enlarged. Tortuous calcified aorta. Moderate hiatal hernia. Mild bronchitic changes without pleural effusion focal consolidation. No pneumothorax. Chronic deformity bilateral shoulders. Osteopenia. Surgical clips in the included right abdomen compatible with cholecystectomy. IMPRESSION: Mild cardiomegaly.  Mild bronchitic changes. Aortic Atherosclerosis (ICD10-I70.0). Electronically Signed   By: Elon Alas M.D.   On: 12/27/2017 20:04   Ct Head Wo Contrast  Result Date: 12/27/2017 CLINICAL DATA:  Altered level of consciousness. History of hypertension, hyperlipidemia. EXAM: CT HEAD WITHOUT CONTRAST TECHNIQUE: Contiguous axial images were obtained from the base of the skull through the vertex without intravenous contrast. COMPARISON:  None. FINDINGS: BRAIN: No intraparenchymal hemorrhage, mass effect nor midline shift. The ventricles and sulci are normal for age. Patchy supratentorial white matter hypodensities within normal range for patient's age, though non-specific are most  compatible with chronic small vessel ischemic disease. Old LEFT basal ganglia lacunar infarct. No acute large vascular territory infarcts. No abnormal extra-axial fluid collections. Basal cisterns are patent. VASCULAR: Moderate calcific atherosclerosis of the carotid siphons. SKULL: No skull fracture. Osteopenia. No significant scalp soft tissue swelling. SINUSES/ORBITS: Lobulated paranasal sinus mucosal thickening. Trace LEFT mastoid effusion.The included ocular globes and orbital contents are non-suspicious. Status post bilateral ocular lens implants. OTHER: None. IMPRESSION: 1. No acute intracranial process. 2. Old LEFT basal ganglia lacunar infarct, otherwise negative noncontrast CT HEAD for age. Electronically Signed   By: Elon Alas M.D.   On: 12/27/2017 19:52    Medications:  Prior to Admission:  Medications Prior to Admission  Medication Sig Dispense Refill Last Dose  . acetaminophen (TYLENOL) 500 MG tablet Take 500 mg by mouth every 4 (four) hours.   12/27/2017 at Unknown time  . ALPRAZolam (XANAX) 0.5 MG tablet Take 0.5 mg by mouth at bedtime.    12/26/2017 at Unknown time  . amLODipine (NORVASC) 2.5 MG tablet TAKE 1 TABLET BY MOUTH ONCE DAILY 90 tablet 3 12/27/2017 at Unknown time  . ferrous sulfate (FERROUSUL) 325 (65 FE) MG tablet Take 1 tablet (325 mg total) by mouth daily with breakfast. 30 tablet 3 12/27/2017 at Unknown time  . furosemide (  LASIX) 80 MG tablet TAKE ONE-HALF TABLET BY MOUTH ONCE DAILY 45 tablet 4 12/27/2017 at Unknown time  . Omega-3 Fatty Acids (FISH OIL) 1000 MG CAPS Take 1 capsule by mouth daily.     12/27/2017 at Unknown time  . pantoprazole (PROTONIX) 40 MG tablet Take 1 tablet (40 mg total) by mouth daily. 90 tablet 3 12/27/2017 at Unknown time   Scheduled: . aspirin EC  325 mg Oral Daily  . furosemide  120 mg Oral Daily  . mouth rinse  15 mL Mouth Rinse BID  . nitroGLYCERIN  0.5 inch Topical Q6H  . pantoprazole  40 mg Oral Daily  . sodium chloride flush  3  mL Intravenous Q12H  . sodium chloride flush  3 mL Intravenous Q12H   Continuous: . sodium chloride    . cefTRIAXone (ROCEPHIN)  IV Stopped (12/29/17 0006)  . heparin 700 Units/hr (12/29/17 0400)   UGQ:BVQXIH chloride, acetaminophen **OR** acetaminophen, ALPRAZolam, bisacodyl, fentaNYL (SUBLIMAZE) injection, HYDROcodone-acetaminophen, nitroGLYCERIN, ondansetron **OR** ondansetron (ZOFRAN) IV, senna-docusate, sodium chloride flush  Assesment: She was admitted with acute encephalopathy and this was initially thought to be related to food poisoning or some sort of GI issue.  She is better.  She is known to have coronary disease and she had chest pain yesterday with EKG changes suggestive of ischemia.  She became hypotensive pale diaphoretic and I was concerned that she might not survive the episode.  She was transferred to the stepdown unit and started on heparin and nitroglycerin paste and she is better.  She ruled out for MI.  She is known to have chronic combined heart failure at baseline  She has DNR status and does not want further testing.  She tells me today that she does not think that she can manage at home. Principal Problem:   Acute encephalopathy Active Problems:   Essential hypertension   CAD, NATIVE VESSEL   COMBINED HEART FAILURE, CHRONIC    Plan: Continue treatments.  Continue heparin another 24 hours.  Continue nitroglycerin paste.    LOS: 1 day   Leilanni Halvorson L 12/29/2017, 8:20 AM

## 2017-12-29 NOTE — Progress Notes (Signed)
ANTICOAGULATION CONSULT NOTE - Initial Consult  Pharmacy Consult for heparin Indication: chest pain/ACS  No Known Allergies  Patient Measurements: Height: 5\' 1"  (154.9 cm) Weight: 124 lb 1.9 oz (56.3 kg) IBW/kg (Calculated) : 47.8 HEPARIN DW (KG): 56.1  Vital Signs: Temp: 98 F (36.7 C) (05/17 0400) Temp Source: Axillary (05/17 0400) BP: 148/77 (05/17 0600) Pulse Rate: 67 (05/17 0600)  Labs: Recent Labs    12/27/17 1745 12/28/17 0621 12/28/17 1501 12/28/17 2053 12/29/17 0351  HGB 14.1 13.2 14.1  --  13.2  HCT 42.5 40.4 43.1  --  39.6  PLT 203 202 202  --  199  LABPROT  --   --  14.1  --   --   INR  --   --  1.10  --   --   HEPARINUNFRC  --   --   --  0.60 0.58  CREATININE 0.96 0.67 0.86  --   --   TROPONINI <0.03  --  <0.03 <0.03 <0.03    Estimated Creatinine Clearance: 26.9 mL/min (by C-G formula based on SCr of 0.86 mg/dL).   Medical History: Past Medical History:  Diagnosis Date  . CAD (coronary artery disease)   . CHF (congestive heart failure) ([place])   . GERD (gastroesophageal reflux disease)   . GI bleed    UGI  . Hiatal hernia    large, sliding   . Hyperlipidemia    hypercholesterolemia  . Hypertension     Medications:  Medications Prior to Admission  Medication Sig Dispense Refill Last Dose  . acetaminophen (TYLENOL) 500 MG tablet Take 500 mg by mouth every 4 (four) hours.   12/27/2017 at Unknown time  . ALPRAZolam (XANAX) 0.5 MG tablet Take 0.5 mg by mouth at bedtime.    12/26/2017 at Unknown time  . amLODipine (NORVASC) 2.5 MG tablet TAKE 1 TABLET BY MOUTH ONCE DAILY 90 tablet 3 12/27/2017 at Unknown time  . ferrous sulfate (FERROUSUL) 325 (65 FE) MG tablet Take 1 tablet (325 mg total) by mouth daily with breakfast. 30 tablet 3 12/27/2017 at Unknown time  . furosemide (LASIX) 80 MG tablet TAKE ONE-HALF TABLET BY MOUTH ONCE DAILY 45 tablet 4 12/27/2017 at Unknown time  . Omega-3 Fatty Acids (FISH OIL) 1000 MG CAPS Take 1 capsule by mouth daily.      12/27/2017 at Unknown time  . pantoprazole (PROTONIX) 40 MG tablet Take 1 tablet (40 mg total) by mouth daily. 90 tablet 3 12/27/2017 at Unknown time    Assessment: 84 yo admitted with acute encephalopathy and nausea. Patient complaining of chest pain that is radiating into the back. Nitro SL given. Pharmacy asked to start heparin  Goal of Therapy:  Heparin level 0.3-0.7 units/ml Monitor platelets by anticoagulation protocol: Yes   Plan:  Continue heparin infusion at 700 units/hur Check anti-Xa level daily while on heparin Continue to monitor H&H and platelets   Margot Ables, PharmD Clinical Pharmacist 12/29/2017 7:43 AM

## 2017-12-29 NOTE — Care Management Important Message (Signed)
Important Message  Patient Details  Name: Susan Glass MRN: [id number] Date of Birth: 02-11-1917   Medicare Important Message Given:  Yes    [name] 12/29/2017, 11:13 AM

## 2017-12-30 LAB — CBC
HEMATOCRIT: 41 % (ref 36.0–46.0)
Hemoglobin: 13.4 g/dL (ref 12.0–15.0)
MCH: 31.5 pg (ref 26.0–34.0)
MCHC: 32.7 g/dL (ref 30.0–36.0)
MCV: 96.2 fL (ref 78.0–100.0)
PLATELETS: 205 10*3/uL (ref 150–400)
RBC: 4.26 MIL/uL (ref 3.87–5.11)
RDW: 14.3 % (ref 11.5–15.5)
WBC: 9.4 10*3/uL (ref 4.0–10.5)

## 2017-12-30 LAB — URINE CULTURE: Culture: >=100000 — AB

## 2017-12-30 LAB — HEPARIN LEVEL (UNFRACTIONATED): Heparin Unfractionated: 0.41 IU/mL (ref 0.30–0.70)

## 2017-12-30 NOTE — Progress Notes (Signed)
ANTICOAGULATION CONSULT NOTE - follow up  Pharmacy Consult for heparin Indication: chest pain/ACS  No Known Allergies  Patient Measurements: Height: 5\' 1"  (154.9 cm) Weight: 125 lb 7.1 oz (56.9 kg) IBW/kg (Calculated) : 47.8 HEPARIN DW (KG): 56.1  Vital Signs: Temp: 98.5 F (36.9 C) (05/18 0401) Temp Source: Oral (05/18 0401) BP: 138/72 (05/18 0800) Pulse Rate: 74 (05/18 0800)  Labs: Recent Labs    12/27/17 1745 12/28/17 0621 12/28/17 1501 12/28/17 2053 12/29/17 0351 12/30/17 0447 12/30/17 0448  HGB 14.1 13.2 14.1  --  13.2  --  13.4  HCT 42.5 40.4 43.1  --  39.6  --  41.0  PLT 203 202 202  --  199  --  205  LABPROT  --   --  14.1  --   --   --   --   INR  --   --  1.10  --   --   --   --   HEPARINUNFRC  --   --   --  0.60 0.58 0.41  --   CREATININE 0.96 0.67 0.86  --   --   --   --   TROPONINI <0.03  --  <0.03 <0.03 <0.03  --   --    Estimated Creatinine Clearance: 26.9 mL/min (by C-G formula based on SCr of 0.86 mg/dL).  Medical History: Past Medical History:  Diagnosis Date  . CAD (coronary artery disease)   . CHF (congestive heart failure) ([place])   . GERD (gastroesophageal reflux disease)   . GI bleed    UGI  . Hiatal hernia    large, sliding   . Hyperlipidemia    hypercholesterolemia  . Hypertension    Medications:  Medications Prior to Admission  Medication Sig Dispense Refill Last Dose  . acetaminophen (TYLENOL) 500 MG tablet Take 500 mg by mouth every 4 (four) hours.   12/27/2017 at Unknown time  . ALPRAZolam (XANAX) 0.5 MG tablet Take 0.5 mg by mouth at bedtime.    12/26/2017 at Unknown time  . amLODipine (NORVASC) 2.5 MG tablet TAKE 1 TABLET BY MOUTH ONCE DAILY 90 tablet 3 12/27/2017 at Unknown time  . ferrous sulfate (FERROUSUL) 325 (65 FE) MG tablet Take 1 tablet (325 mg total) by mouth daily with breakfast. 30 tablet 3 12/27/2017 at Unknown time  . furosemide (LASIX) 80 MG tablet TAKE ONE-HALF TABLET BY MOUTH ONCE DAILY 45 tablet 4 12/27/2017 at  Unknown time  . Omega-3 Fatty Acids (FISH OIL) 1000 MG CAPS Take 1 capsule by mouth daily.     12/27/2017 at Unknown time  . pantoprazole (PROTONIX) 40 MG tablet Take 1 tablet (40 mg total) by mouth daily. 90 tablet 3 12/27/2017 at Unknown time   Assessment: 84 yo admitted with acute encephalopathy and nausea. Patient complaining of chest pain that is radiating into the back. Nitro SL given. Pharmacy asked to start heparin.  Heparin level is at goal.   Goal of Therapy:  Heparin level 0.3-0.7 units/ml Monitor platelets by anticoagulation protocol: Yes   Plan:  Continue heparin infusion at 700 units/hur Check anti-Xa level daily while on heparin Continue to monitor H&H and platelets  Hart Robinsons, PharmD Clinical Pharmacist Pager:  [phone] 12/30/2017   12/30/2017 9:01 AM

## 2017-12-30 NOTE — Progress Notes (Signed)
Subjective: She says she feels okay.  No chest pain.  No other new complaints.  Objective: Vital signs in last 24 hours: Temp:  [98.3 F (36.8 C)-98.9 F (37.2 C)] 98.5 F (36.9 C) (05/18 0401) Pulse Rate:  [68-86] 74 (05/18 0800) Resp:  [12-23] 20 (05/18 0800) BP: (106-145)/(57-113) 138/72 (05/18 0800) SpO2:  [93 %-100 %] 94 % (05/18 0800) FiO2 (%):  [36 %] 36 % (05/18 0000) Weight:  [56.9 kg (125 lb 7.1 oz)] 56.9 kg (125 lb 7.1 oz) (05/18 0401) Weight change: 0.8 kg (1 lb 12.2 oz) Last BM Date: 12/27/17  Intake/Output from previous day: 05/17 0701 - 05/18 0700 In: 243 [I.V.:143; IV Piggyback:100] Out: -   PHYSICAL EXAM General appearance: alert, cooperative and no distress Resp: clear to auscultation bilaterally Cardio: regular rate and rhythm, S1, S2 normal, no murmur, click, rub or gallop GI: soft, non-tender; bowel sounds normal; no masses,  no organomegaly Extremities: extremities normal, atraumatic, no cyanosis or edema  Lab Results:  Results for orders placed or performed during the hospital encounter of 12/27/17 (from the past 48 hour(s))  MRSA PCR Screening     Status: None   Collection Time: 12/28/17  2:51 PM  Result Value Ref Range   MRSA by PCR NEGATIVE NEGATIVE    Comment:        The GeneXpert MRSA Assay (FDA approved for NASAL specimens only), is one component of a comprehensive MRSA colonization surveillance program. It is not intended to diagnose MRSA infection nor to guide or monitor treatment for MRSA infections. Performed at [facility], [address], [place], [place] [address]   Troponin I (q 6hr x 3)     Status: None   Collection Time: 12/28/17  3:01 PM  Result Value Ref Range   Troponin I <0.03 <0.03 ng/mL    Comment: Performed at [facility], [address] [place], [place] [address]  Lipase, blood     Status: None   Collection Time: 12/28/17  3:01 PM  Result Value Ref Range   Lipase 32 11 - 51 U/L    Comment: Performed at [facility], [address], [place], [place] [address]  Basic metabolic panel     Status: Abnormal   Collection Time: 12/28/17  3:01 PM  Result Value Ref Range   Sodium 139 135 - 145 mmol/L   Potassium 4.1 3.5 - 5.1 mmol/L   Chloride 103 101 - 111 mmol/L   CO2 23 22 - 32 mmol/L   Glucose, Bld 102 (H) 65 - 99 mg/dL   BUN 25 (H) 6 - 20 mg/dL   Creatinine, Ser 0.86 0.44 - 1.00 mg/dL   Calcium 9.1 8.9 - 10.3 mg/dL   GFR calc non Af Amer 54 (L) >60 mL/min   GFR calc Af Amer >60 >60 mL/min    Comment: (NOTE) The eGFR has been calculated using the CKD EPI equation. This calculation has not been validated in all clinical situations. eGFR's persistently <60 mL/min signify possible Chronic Kidney Disease.    Anion gap 13 5 - 15    Comment: Performed at [facility], [address], [place], [place] [address]  CBC with Differential/Platelet     Status: None   Collection Time: 12/28/17  3:01 PM  Result Value Ref Range   WBC 9.4 4.0 - 10.5 K/uL   RBC 4.57 3.87 - 5.11 MIL/uL   Hemoglobin 14.1 12.0 - 15.0 g/dL   HCT 43.1 36.0 - 46.0 %   MCV  94.3 78.0 - 100.0 fL   MCH 30.9 26.0 - 34.0 pg   MCHC 32.7 30.0 - 36.0 g/dL   RDW 14.0 11.5 - 15.5 %   Platelets 202 150 - 400 K/uL   Neutrophils Relative % 62 %   Neutro Abs 5.8 1.7 - 7.7 K/uL   Lymphocytes Relative 27 %   Lymphs Abs 2.5 0.7 - 4.0 K/uL   Monocytes Relative 10 %   Monocytes Absolute 0.9 0.1 - 1.0 K/uL   Eosinophils Relative 1 %   Eosinophils Absolute 0.1 0.0 - 0.7 K/uL   Basophils Relative 0 %   Basophils Absolute 0.0 0.0 - 0.1 K/uL    Comment: Performed at [facility], [address], [place], [place] [address]  Protime-INR     Status: None   Collection Time: 12/28/17  3:01 PM  Result Value Ref Range   Prothrombin Time 14.1 11.4 - 15.2 seconds   INR 1.10     Comment: Performed at [facility], [address] [place], [place] [address]  Troponin I (q 6hr x 3)     Status: None   Collection Time: 12/28/17  8:53 PM  Result Value Ref  Range   Troponin I <0.03 <0.03 ng/mL    Comment: Performed at [facility], [address] [place], [place] [address]  Heparin level (unfractionated)     Status: None   Collection Time: 12/28/17  8:53 PM  Result Value Ref Range   Heparin Unfractionated 0.60 0.30 - 0.70 IU/mL    Comment: (NOTE) If heparin results are below expected values, and patient dosage has  been confirmed, suggest follow up testing of antithrombin III levels. Performed at [facility], [address], [place], [place] [address]   Troponin I (q 6hr x 3)     Status: None   Collection Time: 12/29/17  3:51 AM  Result Value Ref Range   Troponin I <0.03 <0.03 ng/mL    Comment: Performed at [facility], [address] [place], [place] [address]  Heparin level (unfractionated)     Status: None   Collection Time: 12/29/17  3:51 AM  Result Value Ref Range   Heparin Unfractionated 0.58 0.30 - 0.70 IU/mL    Comment: (NOTE) If heparin results are below expected values, and patient dosage has  been confirmed, suggest follow up testing of antithrombin III levels. Performed at [facility], [address], [place], [place] [address]   CBC     Status: None   Collection Time: 12/29/17  3:51 AM  Result Value Ref Range   WBC 7.9 4.0 - 10.5 K/uL   RBC 4.15 3.87 - 5.11 MIL/uL   Hemoglobin 13.2 12.0 - 15.0 g/dL   HCT 39.6 36.0 - 46.0 %   MCV 95.4 78.0 - 100.0 fL   MCH 31.8 26.0 - 34.0 pg   MCHC 33.3 30.0 - 36.0 g/dL   RDW 14.0 11.5 - 15.5 %   Platelets 199 150 - 400 K/uL    Comment: Performed at [facility], [address], [place], [place] [address]  Heparin level (unfractionated)     Status: None   Collection Time: 12/30/17  4:47 AM  Result Value Ref Range   Heparin Unfractionated 0.41 0.30 - 0.70 IU/mL    Comment: (NOTE) If heparin results are below expected values, and patient dosage has  been confirmed, suggest follow up testing of antithrombin III levels. Performed at [facility], [address], [place], [place]  [address]   CBC  Status: None   Collection Time: 12/30/17  4:48 AM  Result Value Ref Range   WBC 9.4 4.0 - 10.5 K/uL   RBC 4.26 3.87 - 5.11 MIL/uL   Hemoglobin 13.4 12.0 - 15.0 g/dL   HCT 41.0 36.0 - 46.0 %   MCV 96.2 78.0 - 100.0 fL   MCH 31.5 26.0 - 34.0 pg   MCHC 32.7 30.0 - 36.0 g/dL   RDW 14.3 11.5 - 15.5 %   Platelets 205 150 - 400 K/uL    Comment: Performed at [facility], [address] [place], [place] [address]    ABGS No results for input(s): PHART, PO2ART, TCO2, HCO3 in the last 72 hours.  Invalid input(s): PCO2 CULTURES Recent Results (from the past 240 hour(s))  Urine culture     Status: Abnormal   Collection Time: 12/27/17  9:00 PM  Result Value Ref Range Status   Specimen Description   Final    URINE, RANDOM Performed at [facility], [address], [place], [place] [address]    Special Requests   Final    NONE Performed at [facility], [address] [place],  [address]    Culture >=100,000 COLONIES/mL ESCHERICHIA COLI (A)  Final   Report Status 12/30/2017 FINAL  Final   Organism ID, Bacteria ESCHERICHIA COLI (A)  Final      Susceptibility   Escherichia coli - MIC*    AMPICILLIN >=32 RESISTANT Resistant     CEFAZOLIN 16 SENSITIVE Sensitive     CEFTRIAXONE <=1 SENSITIVE Sensitive     CIPROFLOXACIN <=0.25 SENSITIVE Sensitive     GENTAMICIN <=1 SENSITIVE Sensitive     IMIPENEM <=0.25 SENSITIVE Sensitive     NITROFURANTOIN <=16 SENSITIVE Sensitive     TRIMETH/SULFA <=20 SENSITIVE Sensitive     AMPICILLIN/SULBACTAM >=32 RESISTANT Resistant     PIP/TAZO <=4 SENSITIVE Sensitive     Extended ESBL NEGATIVE Sensitive     * >=100,000 COLONIES/mL ESCHERICHIA COLI  MRSA PCR Screening     Status: None   Collection Time: 12/28/17  2:51 PM  Result Value Ref Range Status   MRSA by PCR NEGATIVE NEGATIVE Final    Comment:        The GeneXpert MRSA Assay (FDA approved for NASAL specimens only), is one component of a comprehensive MRSA  colonization surveillance program. It is not intended to diagnose MRSA infection nor to guide or monitor treatment for MRSA infections. Performed at [facility], [address], [place],  [address]    Studies/Results: No results found.  Medications:  Prior to Admission:  Medications Prior to Admission  Medication Sig Dispense Refill Last Dose  . acetaminophen (TYLENOL) 500 MG tablet Take 500 mg by mouth every 4 (four) hours.   12/27/2017 at Unknown time  . ALPRAZolam (XANAX) 0.5 MG tablet Take 0.5 mg by mouth at bedtime.    12/26/2017 at Unknown time  . amLODipine (NORVASC) 2.5 MG tablet TAKE 1 TABLET BY MOUTH ONCE DAILY 90 tablet 3 12/27/2017 at Unknown time  . ferrous sulfate (FERROUSUL) 325 (65 FE) MG tablet Take 1 tablet (325 mg total) by mouth daily with breakfast. 30 tablet 3 12/27/2017 at Unknown time  . furosemide (LASIX) 80 MG tablet TAKE ONE-HALF TABLET BY MOUTH ONCE DAILY 45 tablet 4 12/27/2017 at Unknown time  . Omega-3 Fatty Acids (FISH OIL) 1000 MG CAPS Take 1 capsule by mouth daily.     12/27/2017 at Unknown time  . pantoprazole (PROTONIX) 40 MG tablet Take 1  tablet (40 mg total) by mouth daily. 90 tablet 3 12/27/2017 at Unknown time   Scheduled: . aspirin EC  325 mg Oral Daily  . furosemide  120 mg Oral Daily  . mouth rinse  15 mL Mouth Rinse BID  . nitroGLYCERIN  0.5 inch Topical Q6H  . pantoprazole  40 mg Oral Daily  . sodium chloride flush  3 mL Intravenous Q12H  . sodium chloride flush  3 mL Intravenous Q12H   Continuous: . sodium chloride    . cefTRIAXone (ROCEPHIN)  IV Stopped (12/30/17 0128)  . heparin 700 Units/hr (12/30/17 0300)   VXY:IAXKPV chloride, acetaminophen **OR** acetaminophen, ALPRAZolam, bisacodyl, fentaNYL (SUBLIMAZE) injection, HYDROcodone-acetaminophen, nitroGLYCERIN, ondansetron **OR** ondansetron (ZOFRAN) IV, senna-docusate, sodium chloride flush  Assesment: She was admitted with acute encephalopathy.  She then had a episode of severe  chest pain but did not have a myocardial infarction.  She is known to have coronary disease at baseline.  She is substantially improved.  She has chronic kidney disease which is stable  She has heart failure which is stable  She has E. coli UTI. Principal Problem:   Acute encephalopathy Active Problems:   Essential hypertension   CAD, NATIVE VESSEL   COMBINED HEART FAILURE, CHRONIC    Plan: Transfer from stepdown.  She wants to go to an assisted living facility when she is discharged    LOS: 2 days

## 2017-12-31 DIAGNOSIS — G9341 Metabolic encephalopathy: Secondary | ICD-10-CM | POA: Diagnosis present

## 2017-12-31 LAB — CBC
HEMATOCRIT: 38.4 % (ref 36.0–46.0)
Hemoglobin: 12.9 g/dL (ref 12.0–15.0)
MCH: 31.9 pg (ref 26.0–34.0)
MCHC: 33.6 g/dL (ref 30.0–36.0)
MCV: 94.8 fL (ref 78.0–100.0)
PLATELETS: 187 10*3/uL (ref 150–400)
RBC: 4.05 MIL/uL (ref 3.87–5.11)
RDW: 14.1 % (ref 11.5–15.5)
WBC: 10.1 10*3/uL (ref 4.0–10.5)

## 2017-12-31 LAB — HEPARIN LEVEL (UNFRACTIONATED): Heparin Unfractionated: 0.33 IU/mL (ref 0.30–0.70)

## 2017-12-31 MED ORDER — MECLIZINE HCL 12.5 MG PO TABS
12.5000 mg | ORAL_TABLET | Freq: Three times a day (TID) | ORAL | Status: DC | PRN
  Administered 2017-12-31: 12.5 mg via ORAL
  Filled 2017-12-31: qty 1

## 2017-12-31 MED ORDER — ISOSORBIDE MONONITRATE ER 30 MG PO TB24
15.0000 mg | ORAL_TABLET | Freq: Every day | ORAL | Status: DC
  Administered 2017-12-31 – 2018-01-01 (×2): 15 mg via ORAL
  Filled 2017-12-31 (×2): qty 1

## 2017-12-31 NOTE — Progress Notes (Signed)
Subjective: She says she feels okay except she is dizzy.  She is not having any more chest pain.  She has some pain in her knees.  Objective: Vital signs in last 24 hours: Temp:  [98.9 F (37.2 C)-99.6 F (37.6 C)] 99.1 F (37.3 C) (05/19 0400) Pulse Rate:  [61-86] 61 (05/19 0700) Resp:  [11-25] 19 (05/19 0700) BP: (91-133)/(48-64) 130/61 (05/19 0700) SpO2:  [92 %-98 %] 92 % (05/19 0700) Weight:  [57.9 kg (127 lb 10.3 oz)] 57.9 kg (127 lb 10.3 oz) (05/19 0500) Weight change: 1 kg (2 lb 3.3 oz) Last BM Date: 12/27/17  Intake/Output from previous day: 05/18 0701 - 05/19 0700 In: 1052.7 [P.O.:800; I.V.:152.7; IV Piggyback:100] Out: 800 [Urine:800]  PHYSICAL EXAM General appearance: alert, cooperative and mild distress Resp: clear to auscultation bilaterally Cardio: regular rate and rhythm, S1, S2 normal, no murmur, click, rub or gallop GI: soft, non-tender; bowel sounds normal; no masses,  no organomegaly Extremities: extremities normal, atraumatic, no cyanosis or edema  Lab Results:  Results for orders placed or performed during the hospital encounter of 12/27/17 (from the past 48 hour(s))  Heparin level (unfractionated)     Status: None   Collection Time: 12/30/17  4:47 AM  Result Value Ref Range   Heparin Unfractionated 0.41 0.30 - 0.70 IU/mL    Comment: (NOTE) If heparin results are below expected values, and patient dosage has  been confirmed, suggest follow up testing of antithrombin III levels. Performed at [facility], [address], [place], [place] [address]   CBC     Status: None   Collection Time: 12/30/17  4:48 AM  Result Value Ref Range   WBC 9.4 4.0 - 10.5 K/uL   RBC 4.26 3.87 - 5.11 MIL/uL   Hemoglobin 13.4 12.0 - 15.0 g/dL   HCT 41.0 36.0 - 46.0 %   MCV 96.2 78.0 - 100.0 fL   MCH 31.5 26.0 - 34.0 pg   MCHC 32.7 30.0 - 36.0 g/dL   RDW 14.3 11.5 - 15.5 %   Platelets 205 150 - 400 K/uL    Comment: Performed at [facility], [address]  [place], [place] [address]  Heparin level (unfractionated)     Status: None   Collection Time: 12/31/17  4:15 AM  Result Value Ref Range   Heparin Unfractionated 0.33 0.30 - 0.70 IU/mL    Comment: (NOTE) If heparin results are below expected values, and patient dosage has  been confirmed, suggest follow up testing of antithrombin III levels. Performed at [facility], [address], [place], [place] [address]   CBC     Status: None   Collection Time: 12/31/17  4:16 AM  Result Value Ref Range   WBC 10.1 4.0 - 10.5 K/uL   RBC 4.05 3.87 - 5.11 MIL/uL   Hemoglobin 12.9 12.0 - 15.0 g/dL   HCT 38.4 36.0 - 46.0 %   MCV 94.8 78.0 - 100.0 fL   MCH 31.9 26.0 - 34.0 pg   MCHC 33.6 30.0 - 36.0 g/dL   RDW 14.1 11.5 - 15.5 %   Platelets 187 150 - 400 K/uL    Comment: Performed at [facility], [address] [place], [place] [address]    ABGS No results for input(s): PHART, PO2ART, TCO2, HCO3 in the last 72 hours.  Invalid input(s): PCO2 CULTURES Recent Results (from the past 240 hour(s))  Urine culture     Status: Abnormal   Collection Time: 12/27/17  9:00 PM  Result  Value Ref Range Status   Specimen Description   Final    URINE, RANDOM Performed at [facility], [address], [place], [place] [address]    Special Requests   Final    NONE Performed at [facility], [address] [place], [place] [address]    Culture >=100,000 COLONIES/mL ESCHERICHIA COLI (A)  Final   Report Status 12/30/2017 FINAL  Final   Organism ID, Bacteria ESCHERICHIA COLI (A)  Final      Susceptibility   Escherichia coli - MIC*    AMPICILLIN >=32 RESISTANT Resistant     CEFAZOLIN 16 SENSITIVE Sensitive     CEFTRIAXONE <=1 SENSITIVE Sensitive     CIPROFLOXACIN <=0.25 SENSITIVE Sensitive     GENTAMICIN <=1 SENSITIVE Sensitive     IMIPENEM <=0.25 SENSITIVE Sensitive     NITROFURANTOIN <=16 SENSITIVE Sensitive     TRIMETH/SULFA <=20 SENSITIVE Sensitive     AMPICILLIN/SULBACTAM >=32 RESISTANT Resistant      PIP/TAZO <=4 SENSITIVE Sensitive     Extended ESBL NEGATIVE Sensitive     * >=100,000 COLONIES/mL ESCHERICHIA COLI  MRSA PCR Screening     Status: None   Collection Time: 12/28/17  2:51 PM  Result Value Ref Range Status   MRSA by PCR NEGATIVE NEGATIVE Final    Comment:        The GeneXpert MRSA Assay (FDA approved for NASAL specimens only), is one component of a comprehensive MRSA colonization surveillance program. It is not intended to diagnose MRSA infection nor to guide or monitor treatment for MRSA infections. Performed at [facility], [address] [place], [place] [address]    Studies/Results: No results found.  Medications:  Prior to Admission:  Medications Prior to Admission  Medication Sig Dispense Refill Last Dose  . acetaminophen (TYLENOL) 500 MG tablet Take 500 mg by mouth every 4 (four) hours.   12/27/2017 at Unknown time  . ALPRAZolam (XANAX) 0.5 MG tablet Take 0.5 mg by mouth at bedtime.    12/26/2017 at Unknown time  . amLODipine (NORVASC) 2.5 MG tablet TAKE 1 TABLET BY MOUTH ONCE DAILY 90 tablet 3 12/27/2017 at Unknown time  . ferrous sulfate (FERROUSUL) 325 (65 FE) MG tablet Take 1 tablet (325 mg total) by mouth daily with breakfast. 30 tablet 3 12/27/2017 at Unknown time  . furosemide (LASIX) 80 MG tablet TAKE ONE-HALF TABLET BY MOUTH ONCE DAILY 45 tablet 4 12/27/2017 at Unknown time  . Omega-3 Fatty Acids (FISH OIL) 1000 MG CAPS Take 1 capsule by mouth daily.     12/27/2017 at Unknown time  . pantoprazole (PROTONIX) 40 MG tablet Take 1 tablet (40 mg total) by mouth daily. 90 tablet 3 12/27/2017 at Unknown time   Scheduled: . aspirin EC  325 mg Oral Daily  . furosemide  120 mg Oral Daily  . mouth rinse  15 mL Mouth Rinse BID  . nitroGLYCERIN  0.5 inch Topical Q6H  . pantoprazole  40 mg Oral Daily  . sodium chloride flush  3 mL Intravenous Q12H  . sodium chloride flush  3 mL Intravenous Q12H   Continuous: . sodium chloride    . cefTRIAXone (ROCEPHIN)  IV  Stopped (12/31/17 0023)   ZDG:LOVFIE chloride, acetaminophen **OR** acetaminophen, ALPRAZolam, bisacodyl, fentaNYL (SUBLIMAZE) injection, HYDROcodone-acetaminophen, meclizine, nitroGLYCERIN, ondansetron **OR** ondansetron (ZOFRAN) IV, senna-docusate, sodium chloride flush  Assesment: She had metabolic encephalopathy related to UTI.  She had significant issues with chest pain and she was transferred to stepdown and placed on heparin drip.  She is no longer having any chest pain and she ruled out for acute MI.  She is known to have coronary disease at baseline.  She has heart failure at baseline.  She is complaining of dizziness now.  She has arthritis in multiple joints right now bothering her knees. Principal Problem:   Acute encephalopathy Active Problems:   Essential hypertension   CAD, NATIVE VESSEL   COMBINED HEART FAILURE, CHRONIC   Metabolic encephalopathy    Plan: Continue treatments.  Transfer from stepdown when bed available    LOS: 3 days   Susan Glass L 12/31/2017, 9:45 AM

## 2017-12-31 NOTE — Progress Notes (Signed)
ANTICOAGULATION CONSULT NOTE - follow up  Pharmacy Consult for heparin Indication: chest pain/ACS  No Known Allergies  Patient Measurements: Height: 5\' 1"  (154.9 cm) Weight: 127 lb 10.3 oz (57.9 kg) IBW/kg (Calculated) : 47.8 HEPARIN DW (KG): 56.1  Vital Signs: Temp: 99.1 F (37.3 C) (05/19 0400) Temp Source: Axillary (05/19 0400) BP: 130/61 (05/19 0700) Pulse Rate: 61 (05/19 0700)  Labs: Recent Labs    12/28/17 1501  12/28/17 2053 12/29/17 0351 12/30/17 0447 12/30/17 0448 12/31/17 0415 12/31/17 0416  HGB 14.1  --   --  13.2  --  13.4  --  12.9  HCT 43.1  --   --  39.6  --  41.0  --  38.4  PLT 202  --   --  199  --  205  --  187  LABPROT 14.1  --   --   --   --   --   --   --   INR 1.10  --   --   --   --   --   --   --   HEPARINUNFRC  --    < > 0.60 0.58 0.41  --  0.33  --   CREATININE 0.86  --   --   --   --   --   --   --   TROPONINI <0.03  --  <0.03 <0.03  --   --   --   --    < > = values in this interval not displayed.   Estimated Creatinine Clearance: 29.2 mL/min (by C-G formula based on SCr of 0.86 mg/dL).  Medical History: Past Medical History:  Diagnosis Date  . CAD (coronary artery disease)   . CHF (congestive heart failure) ([facility])   . GERD (gastroesophageal reflux disease)   . GI bleed    UGI  . Hiatal hernia    large, sliding   . Hyperlipidemia    hypercholesterolemia  . Hypertension    Medications:  Medications Prior to Admission  Medication Sig Dispense Refill Last Dose  . acetaminophen (TYLENOL) 500 MG tablet Take 500 mg by mouth every 4 (four) hours.   12/27/2017 at Unknown time  . ALPRAZolam (XANAX) 0.5 MG tablet Take 0.5 mg by mouth at bedtime.    12/26/2017 at Unknown time  . amLODipine (NORVASC) 2.5 MG tablet TAKE 1 TABLET BY MOUTH ONCE DAILY 90 tablet 3 12/27/2017 at Unknown time  . ferrous sulfate (FERROUSUL) 325 (65 FE) MG tablet Take 1 tablet (325 mg total) by mouth daily with breakfast. 30 tablet 3 12/27/2017 at Unknown time  .  furosemide (LASIX) 80 MG tablet TAKE ONE-HALF TABLET BY MOUTH ONCE DAILY 45 tablet 4 12/27/2017 at Unknown time  . Omega-3 Fatty Acids (FISH OIL) 1000 MG CAPS Take 1 capsule by mouth daily.     12/27/2017 at Unknown time  . pantoprazole (PROTONIX) 40 MG tablet Take 1 tablet (40 mg total) by mouth daily. 90 tablet 3 12/27/2017 at Unknown time   Assessment: 84 yo admitted with acute encephalopathy and nausea. Patient complaining of chest pain that is radiating into the back. Nitro SL given. Pharmacy asked to start heparin.  Heparin level is at goal.   Goal of Therapy:  Heparin level 0.3-0.7 units/ml Monitor platelets by anticoagulation protocol: Yes   Plan:  Continue heparin infusion at 700 units/hur Check anti-Xa level daily while on heparin Continue to monitor H&H and platelets  Hart Robinsons, PharmD Clinical Pharmacist Pager:  [phone] 12/31/2017  12/31/2017 9:22 AM

## 2018-01-01 DIAGNOSIS — I200 Unstable angina: Secondary | ICD-10-CM | POA: Diagnosis not present

## 2018-01-01 MED ORDER — NITROGLYCERIN 0.4 MG SL SUBL: 0.4000 mg | SUBLINGUAL_TABLET | SUBLINGUAL | 12 refills | Status: AC | PRN

## 2018-01-01 MED ORDER — HYDROCODONE-ACETAMINOPHEN 5-325 MG PO TABS: 1.0000 | ORAL_TABLET | ORAL | 0 refills | Status: DC | PRN

## 2018-01-01 MED ORDER — ASPIRIN 325 MG PO TBEC: 325.0000 mg | DELAYED_RELEASE_TABLET | Freq: Every day | ORAL | 0 refills | Status: DC

## 2018-01-01 MED ORDER — ISOSORBIDE MONONITRATE ER 30 MG PO TB24: 15.0000 mg | ORAL_TABLET | Freq: Every day | ORAL | Status: AC

## 2018-01-01 MED ORDER — MECLIZINE HCL 12.5 MG PO TABS: 12.5000 mg | ORAL_TABLET | Freq: Three times a day (TID) | ORAL | 0 refills | Status: DC | PRN

## 2018-01-01 NOTE — Care Management Important Message (Signed)
Important Message  Patient Details  Name: MAXI CARRERAS MRN: [id number] Date of Birth: [birth year]   Medicare Important Message Given:  Yes    [name] 01/01/2018, 11:31 AM

## 2018-01-01 NOTE — NC FL2 (Signed)
[place] LEVEL OF CARE SCREENING TOOL     IDENTIFICATION  Patient Name: Susan Glass Birthdate: [birth year] Sex: female Admission Date (Current Location): 12/27/2017  [facility] and [place] Number:  [employer] and Address:  [place] [address], [place]      Provider Number: [id number]  Attending Physician Name and Address:  Sinda Du, MD  Relative Name and Phone Number:       Current Level of Care: SNF Recommended Level of Care: [place] Prior Approval Number:    Date Approved/Denied:   PASRR Number: [id number]  Discharge Plan: SNF    Current Diagnoses: Patient Active Problem List   Diagnosis Date Noted  . Angina pectoris, unstable ([place]) 01/01/2018  . Metabolic encephalopathy 63/84/6659  . Acute encephalopathy 12/27/2017  . GERD (gastroesophageal reflux disease) 04/29/2015  . Gastritis and gastroduodenitis 11/29/2014  . Weakness 11/27/2014  . Edema extremities 11/17/2010  . CAD, NATIVE VESSEL 08/19/2009  . COMBINED HEART FAILURE, CHRONIC 08/19/2009  . HYPERCHOLESTEROLEMIA 08/18/2009  . Essential hypertension 08/18/2009    Orientation RESPIRATION BLADDER Height & Weight     Self  Normal Continent Weight: 127 lb 10.3 oz (57.9 kg) Height:  5\' 1"  (154.9 cm)  BEHAVIORAL SYMPTOMS/MOOD NEUROLOGICAL BOWEL NUTRITION STATUS      Continent Diet(Heart Healthy)  AMBULATORY STATUS COMMUNICATION OF NEEDS Skin   Limited Assist Verbally Normal                       Personal Care Assistance Level of Assistance  Bathing, Feeding, Dressing Bathing Assistance: Limited assistance Feeding assistance: Independent Dressing Assistance: Limited assistance     Functional Limitations Info  Sight, Hearing, Speech Sight Info: Adequate Hearing Info: Adequate Speech Info: Adequate    SPECIAL CARE FACTORS FREQUENCY  PT (By licensed PT)     PT Frequency: 5x/week              Contractures  Contractures Info: Not present    Additional Factors Info  Code Status, Allergies, Psychotropic Code Status Info: DNR Allergies Info: NKA Psychotropic Info: Xanax         Current Medications (01/01/2018):  This is the current hospital active medication list Current Facility-Administered Medications  Medication Dose Route Frequency Provider Last Rate Last Dose  . 0.9 %  sodium chloride infusion  250 mL Intravenous PRN Sinda Du, MD      . acetaminophen (TYLENOL) tablet 650 mg  650 mg Oral Q6H PRN Sinda Du, MD   650 mg at 01/01/18 9357   Or  . acetaminophen (TYLENOL) suppository 650 mg  650 mg Rectal Q6H PRN Sinda Du, MD      . ALPRAZolam Duanne Moron) tablet 0.5 mg  0.5 mg Oral QHS PRN Sinda Du, MD   0.5 mg at 12/29/17 2157  . aspirin EC tablet 325 mg  325 mg Oral Daily Sinda Du, MD   325 mg at 01/01/18 0936  . bisacodyl (DULCOLAX) EC tablet 5 mg  5 mg Oral Daily PRN Sinda Du, MD   5 mg at 01/01/18 1024  . cefTRIAXone (ROCEPHIN) 1 g in sodium chloride 0.9 % 100 mL IVPB  1 g Intravenous Q24H Sinda Du, MD   Stopped at 01/01/18 0033  . HYDROcodone-acetaminophen (NORCO/VICODIN) 5-325 MG per tablet 1-2 tablet  1-2 tablet Oral Q4H PRN Sinda Du, MD   1 tablet at 01/01/18 0018  . isosorbide mononitrate (IMDUR) 24 hr tablet 15 mg  15  mg Oral Daily Sinda Du, MD   15 mg at 01/01/18 0936  . meclizine (ANTIVERT) tablet 12.5 mg  12.5 mg Oral TID PRN Sinda Du, MD   12.5 mg at 12/31/17 1030  . nitroGLYCERIN (NITROSTAT) SL tablet 0.4 mg  0.4 mg Sublingual Q5 min PRN Sinda Du, MD      . ondansetron [facility]) tablet 4 mg  4 mg Oral Q6H PRN Sinda Du, MD       Or  . ondansetron [facility]) injection 4 mg  4 mg Intravenous Q6H PRN Sinda Du, MD   4 mg at 12/28/17 1730  . pantoprazole (PROTONIX) EC tablet 40 mg  40 mg Oral Daily Sinda Du, MD   40 mg at 01/01/18 0936  . senna-docusate (Senokot-S) tablet 1 tablet  1 tablet Oral  QHS PRN Sinda Du, MD      . sodium chloride flush (NS) 0.9 % injection 3 mL  3 mL Intravenous Q12H Sinda Du, MD   3 mL at 12/30/17 2354  . sodium chloride flush (NS) 0.9 % injection 3 mL  3 mL Intravenous Q12H Sinda Du, MD   3 mL at 01/01/18 0936  . sodium chloride flush (NS) 0.9 % injection 3 mL  3 mL Intravenous PRN Sinda Du, MD         Discharge Medications: Please see discharge summary for a list of discharge medications.  Relevant Imaging Results:  Relevant Lab Results:   Additional Information SSN [id number], [name], LCSW

## 2018-01-01 NOTE — Evaluation (Signed)
Physical Therapy Evaluation Patient Details Name: RONALEE SCHEUNEMANN MRN: [id number] DOB: 01/07/1917 Today's Date: 01/01/2018   History of Present Illness  SORIYAH OSBERG is a 84 y.o. female with medical history significant for coronary artery disease, chronic combined CHF, and hypertension, now presenting to the emergency department for evaluation of acute generalized weakness and confusion.  The patient had reportedly been in her usual state of health, was having an uneventful day, and then developed acute nausea with generalized weakness and confusion.  The patient reports that she ate a cheeseburger at approximately noon, and attributes the illness to this.  She reports developing severe nausea at approximately 1 PM, sat on the toilet, and experienced severe generalized weakness.  She called her son who was out of the house but nearby, and he called EMS and and her at home to find the patient confused and generally weak.  She required intensive assistance to get onto the gurney for transport to the hospital.    Clinical Impression  Patient very deconditioned since last visit, limited to a few steps at bedside due to generalized weakness, bilateral knee pain and fatigue.  Patient tolerated sitting up on [facility] to attempt a bowel movement with nursing staff in room after therapy.  Patient will benefit from continued physical therapy in hospital and recommended venue below to increase strength, balance, endurance for safe ADLs and gait.    Follow Up Recommendations SNF    Equipment Recommendations  None recommended by PT    Recommendations for Other Services       Precautions / Restrictions Precautions Precautions: Fall Restrictions Weight Bearing Restrictions: No      Mobility  Bed Mobility Overal bed mobility: Needs Assistance Bed Mobility: Supine to Sit     Supine to sit: Mod assist     General bed mobility comments: labored slow movement  Transfers Overall transfer level: Needs  assistance Equipment used: Standard walker Transfers: Sit to/from Stand;Stand Pivot Transfers Sit to Stand: Mod assist Stand pivot transfers: Mod assist       General transfer comment: slow labored movement  Ambulation/Gait Ambulation/Gait assistance: Mod assist Ambulation Distance (Feet): 4 Feet Assistive device: Rolling walker (2 wheeled) Gait Pattern/deviations: Decreased step length - right;Decreased step length - left;Decreased stride length Gait velocity: slow   General Gait Details: limited to 5-6 unsteady labored steps at bedside due to poor standing balance, BLE weakness  Stairs            Wheelchair Mobility    Modified Rankin (Stroke Patients Only)       Balance Overall balance assessment: Needs assistance Sitting-balance support: Feet supported;No upper extremity supported Sitting balance-Leahy Scale: Good     Standing balance support: Bilateral upper extremity supported;During functional activity Standing balance-Leahy Scale: Poor Standing balance comment: fair/poor with RW                             Pertinent Vitals/Pain Pain Assessment: Faces Faces Pain Scale: Hurts even more Pain Location: bilateral knees with movement, pressure Pain Descriptors / Indicators: Aching Pain Intervention(s): Limited activity within patient's tolerance;Monitored during session    Home Living Family/patient expects to be discharged to:: Private residence Living Arrangements: Alone Available Help at Discharge: Family Type of Home: Apartment Home Access: Level entry     Home Layout: One level Home Equipment: Walker - standard;Cane - single point      Prior Function Level of Independence: Needs assistance   Gait /  Transfers Assistance Needed: household ambulation with Standard Walker  ADL's / Homemaking Assistance Needed: assisted by Son and Grandson        Hand Dominance        Extremity/Trunk Assessment   Upper Extremity  Assessment Upper Extremity Assessment: Generalized weakness    Lower Extremity Assessment Lower Extremity Assessment: Generalized weakness    Cervical / Trunk Assessment Cervical / Trunk Assessment: Kyphotic  Communication   Communication: No difficulties  Cognition Arousal/Alertness: Awake/alert Behavior During Therapy: WFL for tasks assessed/performed;Anxious Overall Cognitive Status: Within Functional Limits for tasks assessed                                 General Comments: very fearful of falling      General Comments      Exercises     Assessment/Plan    PT Assessment Patient needs continued PT services  PT Problem List Decreased strength;Decreased activity tolerance;Decreased balance;Decreased mobility       PT Treatment Interventions Gait training;Functional mobility training;Therapeutic activities;Therapeutic exercise;Patient/family education;Stair training    PT Goals (Current goals can be found in the Care Plan section)  Acute Rehab PT Goals Patient Stated Goal: return home after rehab PT Goal Formulation: With patient Time For Goal Achievement: 01/18/18    Frequency Min 3X/week   Barriers to discharge        Co-evaluation               AM-PAC PT "6 Clicks" Daily Activity  Outcome Measure Difficulty turning over in bed (including adjusting bedclothes, sheets and blankets)?: A Little Difficulty moving from lying on back to sitting on the side of the bed? : A Lot Difficulty sitting down on and standing up from a chair with arms (e.g., wheelchair, bedside commode, etc,.)?: A Lot Help needed moving to and from a bed to chair (including a wheelchair)?: A Lot Help needed walking in hospital room?: A Lot Help needed climbing 3-5 steps with a railing? : Total 6 Click Score: 12    End of Session   Activity Tolerance: Patient limited by fatigue;Patient limited by pain;Patient tolerated treatment well Patient left: with call  bell/phone within reach;with nursing/sitter in room;Other (comment)(left on [facility] with nursing staff in room) Nurse Communication: Mobility status PT Visit Diagnosis: Unsteadiness on feet (R26.81);Other abnormalities of gait and mobility (R26.89);Muscle weakness (generalized) (M62.81)    Time: 1610-9604 PT Time Calculation (min) (ACUTE ONLY): 22 min   Charges:   PT Evaluation $PT Eval Moderate Complexity: 1 Mod PT Treatments $Therapeutic Activity: 8-22 mins   PT G Codes:        10:19 AM, 01-05-18 Lonell Grandchild, MPT Physical Therapist with [facility] [phone] office [phone] mobile phone

## 2018-01-01 NOTE — Progress Notes (Signed)
Patient is to be discharged to [facility] and in stable condition. Patient's IV and telemetry removed, WNL. Patient and family made aware of discharge. Report called to [facility] at [place] and patient to be transported by EMS.  Celestia Khat, RN

## 2018-01-01 NOTE — Discharge Summary (Signed)
Physician Discharge Summary  Patient ID: Susan Glass MRN: [id number] DOB/AGE: Dec 29, 1916 84 y.o. Primary Care Physician:Kapri Nero, Percell Miller, MD Admit date: 12/27/2017 Discharge date: 01/01/2018    Discharge Diagnoses:   Principal Problem:   Acute encephalopathy Active Problems:   Essential hypertension   CAD, NATIVE VESSEL   COMBINED HEART FAILURE, CHRONIC   Metabolic encephalopathy   Angina pectoris, unstable (HCC) E. coli urinary tract infection Osteoarthritis of multiple joints  Allergies as of 01/01/2018   No Known Allergies     Medication List    TAKE these medications   acetaminophen 500 MG tablet Commonly known as:  TYLENOL Take 500 mg by mouth every 4 (four) hours.   ALPRAZolam 0.5 MG tablet Commonly known as:  XANAX Take 0.5 mg by mouth at bedtime.   amLODipine 2.5 MG tablet Commonly known as:  NORVASC TAKE 1 TABLET BY MOUTH ONCE DAILY   aspirin 325 MG EC tablet Take 1 tablet (325 mg total) by mouth daily.   ferrous sulfate 325 (65 FE) MG tablet Commonly known as:  FERROUSUL Take 1 tablet (325 mg total) by mouth daily with breakfast.   Fish Oil 1000 MG Caps Take 1 capsule by mouth daily.   furosemide 80 MG tablet Commonly known as:  LASIX TAKE ONE-HALF TABLET BY MOUTH ONCE DAILY   HYDROcodone-acetaminophen 5-325 MG tablet Commonly known as:  NORCO/VICODIN Take 1-2 tablets by mouth every 4 (four) hours as needed for moderate pain.   isosorbide mononitrate 30 MG 24 hr tablet Commonly known as:  IMDUR Take 0.5 tablets (15 mg total) by mouth daily.   meclizine 12.5 MG tablet Commonly known as:  ANTIVERT Take 1 tablet (12.5 mg total) by mouth 3 (three) times daily as needed for dizziness.   nitroGLYCERIN 0.4 MG SL tablet Commonly known as:  NITROSTAT Place 1 tablet (0.4 mg total) under the tongue every 5 (five) minutes as needed for chest pain.   pantoprazole 40 MG tablet Commonly known as:  PROTONIX Take 1 tablet (40 mg total) by mouth daily.        Discharged Condition: Improved    Consults: None  Significant Diagnostic Studies: Dg Chest 2 View  Result Date: 12/27/2017 CLINICAL DATA:  Altered mental status, tachypnea. EXAM: CHEST - 2 VIEW COMPARISON:  Chest radiograph October 19, 2009 FINDINGS: Cardiomediastinal silhouette is cardiac silhouette is mildly enlarged. Tortuous calcified aorta. Moderate hiatal hernia. Mild bronchitic changes without pleural effusion focal consolidation. No pneumothorax. Chronic deformity bilateral shoulders. Osteopenia. Surgical clips in the included right abdomen compatible with cholecystectomy. IMPRESSION: Mild cardiomegaly.  Mild bronchitic changes. Aortic Atherosclerosis (ICD10-I70.0). Electronically Signed   By: Elon Alas M.D.   On: 12/27/2017 20:04   Ct Head Wo Contrast  Result Date: 12/27/2017 CLINICAL DATA:  Altered level of consciousness. History of hypertension, hyperlipidemia. EXAM: CT HEAD WITHOUT CONTRAST TECHNIQUE: Contiguous axial images were obtained from the base of the skull through the vertex without intravenous contrast. COMPARISON:  None. FINDINGS: BRAIN: No intraparenchymal hemorrhage, mass effect nor midline shift. The ventricles and sulci are normal for age. Patchy supratentorial white matter hypodensities within normal range for patient's age, though non-specific are most compatible with chronic small vessel ischemic disease. Old LEFT basal ganglia lacunar infarct. No acute large vascular territory infarcts. No abnormal extra-axial fluid collections. Basal cisterns are patent. VASCULAR: Moderate calcific atherosclerosis of the carotid siphons. SKULL: No skull fracture. Osteopenia. No significant scalp soft tissue swelling. SINUSES/ORBITS: Lobulated paranasal sinus mucosal thickening. Trace LEFT mastoid effusion.The included ocular globes  and orbital contents are non-suspicious. Status post bilateral ocular lens implants. OTHER: None. IMPRESSION: 1. No acute intracranial process.  2. Old LEFT basal ganglia lacunar infarct, otherwise negative noncontrast CT HEAD for age. Electronically Signed   By: Elon Alas M.D.   On: 12/27/2017 19:52    Lab Results: Basic Metabolic Panel: No results for input(s): NA, K, CL, CO2, GLUCOSE, BUN, CREATININE, CALCIUM, MG, PHOS in the last 72 hours. Liver Function Tests: No results for input(s): AST, ALT, ALKPHOS, BILITOT, PROT, ALBUMIN in the last 72 hours.   CBC: Recent Labs    12/30/17 0448 12/31/17 0416  WBC 9.4 10.1  HGB 13.4 12.9  HCT 41.0 38.4  MCV 96.2 94.8  PLT 205 187    Recent Results (from the past 240 hour(s))  Urine culture     Status: Abnormal   Collection Time: 12/27/17  9:00 PM  Result Value Ref Range Status   Specimen Description   Final    URINE, RANDOM Performed at [facility], [address], [place], [place] [address]    Special Requests   Final    NONE Performed at [facility], [address], [place], [place] [address]    Culture >=100,000 COLONIES/mL ESCHERICHIA COLI (A)  Final   Report Status 12/30/2017 FINAL  Final   Organism ID, Bacteria ESCHERICHIA COLI (A)  Final      Susceptibility   Escherichia coli - MIC*    AMPICILLIN >=32 RESISTANT Resistant     CEFAZOLIN 16 SENSITIVE Sensitive     CEFTRIAXONE <=1 SENSITIVE Sensitive     CIPROFLOXACIN <=0.25 SENSITIVE Sensitive     GENTAMICIN <=1 SENSITIVE Sensitive     IMIPENEM <=0.25 SENSITIVE Sensitive     NITROFURANTOIN <=16 SENSITIVE Sensitive     TRIMETH/SULFA <=20 SENSITIVE Sensitive     AMPICILLIN/SULBACTAM >=32 RESISTANT Resistant     PIP/TAZO <=4 SENSITIVE Sensitive     Extended ESBL NEGATIVE Sensitive     * >=100,000 COLONIES/mL ESCHERICHIA COLI  MRSA PCR Screening     Status: None   Collection Time: 12/28/17  2:51 PM  Result Value Ref Range Status   MRSA by PCR NEGATIVE NEGATIVE Final    Comment:        The GeneXpert MRSA Assay (FDA approved for NASAL specimens only), is one component of a comprehensive MRSA  colonization surveillance program. It is not intended to diagnose MRSA infection nor to guide or monitor treatment for MRSA infections. Performed at [facility], [address], [place], [place] [address]      Hospital Course: This is a 84 year old who came to the emergency department with confusion.  She was felt to have acute metabolic encephalopathy and it looks like she had a urinary tract infection.  There was some question as to whether she had some food poisoning as well as she had an event that caused her to be nauseated.  She was admitted and started on IV antibiotics.  She was doing well when she developed acute severe chest pain.  She is known to have coronary artery occlusive disease and she had some ischemic changes laterally on her EKG.  She was started on heparin and transferred to the stepdown unit.  Her pain resolved she ruled out MI and stayed on heparin for about 48 hours and it was discontinued.  She was started on Imdur.  She had PT evaluation.  Discharge Exam: Blood pressure 136/69, pulse 68, temperature 98.5 F (36.9 C), temperature source Oral, resp. rate 18,  height 5\' 1"  (1.549 m), weight 57.9 kg (127 lb 10.3 oz), SpO2 97 %. She is awake and alert.  Chest is clear.  Heart is regular.  Disposition: Potentially to skilled care facility depending on her PT evaluation      Signed: Kataleah Bejar L   01/01/2018, 9:07 AM

## 2018-01-01 NOTE — Clinical Social Work Placement (Signed)
   CLINICAL SOCIAL WORK PLACEMENT  NOTE  Date:  01/01/2018  Patient Details  Name: Susan Glass MRN: [id number] Date of Birth: 12/28/1916  Clinical Social Work is seeking post-discharge placement for this patient at the [place] level of care (*CSW will initial, date and re-position this form in  chart as items are completed):  Yes   Patient/family provided with [place] Work Department's list of facilities offering this level of care within the geographic area requested by the patient (or if unable, by the patient's family).  Yes   Patient/family informed of their freedom to choose among providers that offer the needed level of care, that participate in Medicare, Medicaid or managed care program needed by the patient, have an available bed and are willing to accept the patient.  Yes   Patient/family informed of [facility]'s ownership interest in [facility] and [facility], as well as of the fact that they are under no obligation to receive care at these facilities.  PASRR submitted to [employer] on 01/01/18     PASRR number received on 01/01/18     Existing PASRR number confirmed on       FL2 transmitted to all facilities in geographic area requested by pt/family on 01/01/18     FL2 transmitted to all facilities within larger geographic area on       Patient informed that his/her managed care company has contracts with or will negotiate with certain facilities, including the following:        Yes   Patient/family informed of bed offers received.  Patient chooses bed at [facility]     Physician recommends and patient chooses bed at      Patient to be transferred to [facility] [place] on 01/01/18.  Patient to be transferred to facility by [facility]     Patient family notified on 01/01/18 of transfer.  Name of family member notified:  Daughter in Sports coach     PHYSICIAN       Additional Comment:  Facility notified, Discharge  clinical sent. LCSW signing off.   _______________________________________________ Ihor Gully, LCSW 01/01/2018, 3:38 PM

## 2018-01-01 NOTE — Progress Notes (Signed)
Subjective: She feels okay except for weakness.  She is not having any more dizziness.  No more chest pain.  She has diffuse arthritis and has pain in both knees  Objective: Vital signs in last 24 hours: Temp:  [97.9 F (36.6 C)-98.5 F (36.9 C)] 98.5 F (36.9 C) (05/20 0505) Pulse Rate:  [66-75] 68 (05/20 0505) Resp:  [18-20] 18 (05/20 0505) BP: (107-136)/(48-69) 136/69 (05/20 0505) SpO2:  [93 %-97 %] 97 % (05/20 0505) Weight change:  Last BM Date: 12/27/17  Intake/Output from previous day: 05/19 0701 - 05/20 0700 In: 400 [P.O.:300; IV Piggyback:100] Out: 1000 [Urine:1000]  PHYSICAL EXAM General appearance: alert, cooperative and no distress Resp: clear to auscultation bilaterally Cardio: regular rate and rhythm, S1, S2 normal, no murmur, click, rub or gallop GI: soft, non-tender; bowel sounds normal; no masses,  no organomegaly Extremities: extremities normal, atraumatic, no cyanosis or edema  Lab Results:  Results for orders placed or performed during the hospital encounter of 12/27/17 (from the past 48 hour(s))  Heparin level (unfractionated)     Status: None   Collection Time: 12/31/17  4:15 AM  Result Value Ref Range   Heparin Unfractionated 0.33 0.30 - 0.70 IU/mL    Comment: (NOTE) If heparin results are below expected values, and patient dosage has  been confirmed, suggest follow up testing of antithrombin III levels. Performed at [facility], [address] [place], [place] [address]   CBC     Status: None   Collection Time: 12/31/17  4:16 AM  Result Value Ref Range   WBC 10.1 4.0 - 10.5 K/uL   RBC 4.05 3.87 - 5.11 MIL/uL   Hemoglobin 12.9 12.0 - 15.0 g/dL   HCT 38.4 36.0 - 46.0 %   MCV 94.8 78.0 - 100.0 fL   MCH 31.9 26.0 - 34.0 pg   MCHC 33.6 30.0 - 36.0 g/dL   RDW 14.1 11.5 - 15.5 %   Platelets 187 150 - 400 K/uL    Comment: Performed at [facility], [address] [place], [place] [address]    ABGS No results for input(s): PHART, PO2ART, TCO2,  HCO3 in the last 72 hours.  Invalid input(s): PCO2 CULTURES Recent Results (from the past 240 hour(s))  Urine culture     Status: Abnormal   Collection Time: 12/27/17  9:00 PM  Result Value Ref Range Status   Specimen Description   Final    URINE, RANDOM Performed at [facility], [address] [place], [place] [address]    Special Requests   Final    NONE Performed at [facility], [address] [place], [place] [address]    Culture >=100,000 COLONIES/mL ESCHERICHIA COLI (A)  Final   Report Status 12/30/2017 FINAL  Final   Organism ID, Bacteria ESCHERICHIA COLI (A)  Final      Susceptibility   Escherichia coli - MIC*    AMPICILLIN >=32 RESISTANT Resistant     CEFAZOLIN 16 SENSITIVE Sensitive     CEFTRIAXONE <=1 SENSITIVE Sensitive     CIPROFLOXACIN <=0.25 SENSITIVE Sensitive     GENTAMICIN <=1 SENSITIVE Sensitive     IMIPENEM <=0.25 SENSITIVE Sensitive     NITROFURANTOIN <=16 SENSITIVE Sensitive     TRIMETH/SULFA <=20 SENSITIVE Sensitive     AMPICILLIN/SULBACTAM >=32 RESISTANT Resistant     PIP/TAZO <=4 SENSITIVE Sensitive     Extended ESBL NEGATIVE Sensitive     * >=100,000 COLONIES/mL ESCHERICHIA COLI  MRSA PCR Screening     Status: None  Collection Time: 12/28/17  2:51 PM  Result Value Ref Range Status   MRSA by PCR NEGATIVE NEGATIVE Final    Comment:        The GeneXpert MRSA Assay (FDA approved for NASAL specimens only), is one component of a comprehensive MRSA colonization surveillance program. It is not intended to diagnose MRSA infection nor to guide or monitor treatment for MRSA infections. Performed at [facility], [address] [place], [place] [address]    Studies/Results: No results found.  Medications:  Prior to Admission:  Medications Prior to Admission  Medication Sig Dispense Refill Last Dose  . acetaminophen (TYLENOL) 500 MG tablet Take 500 mg by mouth every 4 (four) hours.   12/27/2017 at Unknown time  . ALPRAZolam (XANAX) 0.5 MG  tablet Take 0.5 mg by mouth at bedtime.    12/26/2017 at Unknown time  . amLODipine (NORVASC) 2.5 MG tablet TAKE 1 TABLET BY MOUTH ONCE DAILY 90 tablet 3 12/27/2017 at Unknown time  . ferrous sulfate (FERROUSUL) 325 (65 FE) MG tablet Take 1 tablet (325 mg total) by mouth daily with breakfast. 30 tablet 3 12/27/2017 at Unknown time  . furosemide (LASIX) 80 MG tablet TAKE ONE-HALF TABLET BY MOUTH ONCE DAILY 45 tablet 4 12/27/2017 at Unknown time  . Omega-3 Fatty Acids (FISH OIL) 1000 MG CAPS Take 1 capsule by mouth daily.     12/27/2017 at Unknown time  . pantoprazole (PROTONIX) 40 MG tablet Take 1 tablet (40 mg total) by mouth daily. 90 tablet 3 12/27/2017 at Unknown time   Scheduled: . aspirin EC  325 mg Oral Daily  . furosemide  120 mg Oral Daily  . isosorbide mononitrate  15 mg Oral Daily  . pantoprazole  40 mg Oral Daily  . sodium chloride flush  3 mL Intravenous Q12H  . sodium chloride flush  3 mL Intravenous Q12H   Continuous: . sodium chloride    . cefTRIAXone (ROCEPHIN)  IV Stopped (01/01/18 0033)   LAG:TXMIWO chloride, acetaminophen **OR** acetaminophen, ALPRAZolam, bisacodyl, HYDROcodone-acetaminophen, meclizine, nitroGLYCERIN, ondansetron **OR** ondansetron (ZOFRAN) IV, senna-docusate, sodium chloride flush  Assesment: She was admitted with acute encephalopathy secondary to urinary tract infection.  This is a metabolic encephalopathy.  She had chest pain but did not have an MI.  She is known to have coronary artery occlusive disease at baseline  She has chronic heart failure which is stable  She has weakness and arthritis diffusely and I am going to get PT consultation to see if she is a candidate for rehab Principal Problem:   Acute encephalopathy Active Problems:   Essential hypertension   CAD, NATIVE VESSEL   COMBINED HEART FAILURE, CHRONIC   Metabolic encephalopathy    Plan: Continue treatments.  PT consultation to see if she is a candidate for rehab    LOS: 4 days    Amado Andal L 01/01/2018, 8:58 AM

## 2018-01-02 LAB — FOLATE RBC
FOLATE, HEMOLYSATE: 446.1 ng/mL
FOLATE, RBC: 1065 ng/mL (ref >498)
Hematocrit: 41.9 % (ref 34.0–46.6)

## 2018-03-29 ENCOUNTER — Encounter: Payer: Self-pay | Admitting: Gastroenterology

## 2018-03-29 ENCOUNTER — Ambulatory Visit (INDEPENDENT_AMBULATORY_CARE_PROVIDER_SITE_OTHER): Payer: Medicare Other | Admitting: Gastroenterology

## 2018-03-29 VITALS — BP 130/74 | HR 72 | Temp 97.2°F | Ht 60.0 in | Wt 129.0 lb

## 2018-03-29 DIAGNOSIS — R197 Diarrhea, unspecified: Secondary | ICD-10-CM

## 2018-03-29 NOTE — Assessment & Plan Note (Addendum)
84 year-old female presenting for further evaluation of persistent 4 weeks duration.  She resides in the nursing home and was in the hospital back in May for UTI.  C. difficile molecular test for toxin B gene negative last month.  Patient reports diarrhea despite use of Imodium.  Otherwise she feels fine.  Would recommend standard C. difficile testing for GDH antigen and toxin A/B with reflex to PCR if needed.  Also check GI pathogen panel.  Patient's daughter-in-law will pick the specimen container up from the lab and help patient collect the stool and drop it back off at [facility].  Further recommendations to follow pending results.

## 2018-03-29 NOTE — Patient Instructions (Signed)
1. Collect stool for test and take to [facility]. Please do not convert to nursing home orders for [facility]. FAMILY IS AGREEABLE TO COLLECT SPECIMEN AND TAKE TO THE LAB THEMSELVES IF NEEDED.

## 2018-03-29 NOTE — Progress Notes (Signed)
Primary Care Physician: Sinda Du, MD  Primary Gastroenterologist:  Garfield Cornea, MD   Chief Complaint  Patient presents with  . Diarrhea    x1 month    HPI: Susan Glass is a 84 y.o. female here for further evaluation of persistent diarrhea at the request of in-home provider.  She was admitted back in May with acute encephalopathy in the setting of UTI.  She was discharged to skilled nursing facility at that time.  Prior to that have been living at home.  We last saw the patient in 2016, she had upper GI bleed requiring hospitalization.  EGD revealed ulcerative reflux esophagitis with stricture status post dilation.  Large hiatal hernia with non-H. pylori gastritis.  She reports last colonoscopy in her early 71s, had a couple of polyps removed.  Nothing in our system. I do see a colonoscopy from 2008 by Dr. Gala Romney, external hemorrhoids, left-sided diverticula, diminutive polyp biopsied/removed (hyperplastic),  oozing erosion at the ileocecal valve biopsy (benign likely nsaid related).  For the past 4 weeks she has had diarrhea. Prior to this a little constipation. Asked for laxative once or twice per week.  She believes she was given something to help her move her bowels.  However then she started having diarrhea which is not let up.  3-6 BM per day but mostly 2 per day now. Tapered off some. Loose to watery. Incontinence. No melena, brbpr. immodium not helping.  Nothing on her medication list in the category of laxatives.  Denies any problems with her appetite.  She eats well.  No vomiting, heartburn, dysphagia.  Stool test on July 24, C. difficile molecular checking for C. difficile toxin B gene was negative.  Labs from July 20 with a hemoglobin 11.9 (normal 12-16), hematocrit 35.6, white blood cell count 8300, platelets 182,000, glucose 89, creatinine 0.6    Current Outpatient Medications  Medication Sig Dispense Refill  . acetaminophen (TYLENOL) 500 MG tablet Take  1,000 mg by mouth 2 (two) times daily.     Marland Kitchen amLODipine (NORVASC) 2.5 MG tablet TAKE 1 TABLET BY MOUTH ONCE DAILY 90 tablet 3  . aspirin 81 MG chewable tablet Chew by mouth daily.    . ferrous sulfate (FERROUSUL) 325 (65 FE) MG tablet Take 1 tablet (325 mg total) by mouth daily with breakfast. 30 tablet 3  . furosemide (LASIX) 80 MG tablet TAKE ONE-HALF TABLET BY MOUTH ONCE DAILY 45 tablet 4  . isosorbide mononitrate (IMDUR) 30 MG 24 hr tablet Take 0.5 tablets (15 mg total) by mouth daily.    Marland Kitchen loperamide (IMODIUM) 2 MG capsule Take 2 mg by mouth every 8 (eight) hours as needed for diarrhea or loose stools.     . meclizine (ANTIVERT) 12.5 MG tablet Take 1 tablet (12.5 mg total) by mouth 3 (three) times daily as needed for dizziness. 30 tablet 0  . Melatonin 5 MG TABS Take 1 tablet by mouth at bedtime.    . nitroGLYCERIN (NITROSTAT) 0.4 MG SL tablet Place 1 tablet (0.4 mg total) under the tongue every 5 (five) minutes as needed for chest pain.  12  . Omega-3 Fatty Acids (FISH OIL) 1000 MG CAPS Take 1 capsule by mouth daily.      Marland Kitchen omeprazole (PRILOSEC) 20 MG capsule Take 20 mg by mouth at bedtime.    . Probiotic Product (PROBIOTIC PO) Take by mouth 2 (two) times daily.     No current facility-administered medications for this visit.  Allergies as of 03/29/2018  . (No Known Allergies)   Past Medical History:  Diagnosis Date  . CAD (coronary artery disease)   . CHF (congestive heart failure) ([place])   . GERD (gastroesophageal reflux disease)   . GI bleed    UGI  . Hiatal hernia    large, sliding   . Hyperlipidemia    hypercholesterolemia  . Hypertension   . IDA (iron deficiency anemia)    Past Surgical History:  Procedure Laterality Date  . APPENDECTOMY    . CHOLECYSTECTOMY    . COLONOSCOPY  2008   Dr. Gala Romney: Hyperplastic polyp removed, oozing erosion at the ileocecal valve with benign pathology, likely NSAID related.  . ESOPHAGEAL DILATION N/A 11/28/2014   Procedure: ESOPHAGEAL  DILATION;  Surgeon: Danie Binder, MD;  Location: [facility];  Service: Endoscopy;  Laterality: N/A;  . ESOPHAGOGASTRODUODENOSCOPY N/A 11/28/2014   Dr.Fields- UGI bleed d/t severe distal erosive esophagitis in the setting of GE JXN stricture, large sliding hiatal hernia, mild non-erosive gastritis   History reviewed. No pertinent family history. Social History   Tobacco Use  . Smoking status: Never Smoker  . Smokeless tobacco: Never Used  . Tobacco comment: Never smoked  Substance Use Topics  . Alcohol use: No    Alcohol/week: 0.0 standard drinks  . Drug use: No    ROS:  General: Negative for anorexia, weight loss, fever, chills, fatigue, weakness. ENT: Negative for hoarseness, difficulty swallowing , nasal congestion. CV: Negative for chest pain, angina, palpitations, dyspnea on exertion, peripheral edema.  Respiratory: Negative for dyspnea at rest, dyspnea on exertion, cough, sputum, wheezing.  GI: See history of present illness. GU:  Negative for dysuria, hematuria, urinary incontinence, urinary frequency, nocturnal urination.  Endo: Negative for unusual weight change.    Physical Examination:   BP 130/74   Pulse 72   Temp (!) 97.2 F (36.2 C) (Oral)   Ht 5' (1.524 m)   Wt 129 lb (58.5 kg) Comment: per nursing home  BMI 25.19 kg/m   General: Well-nourished, well-developed in no acute distress.  Accompanied by son and daughter-in-law, Susan Glass and Susan Glass Eyes: No icterus. Mouth: Oropharyngeal mucosa moist and pink , no lesions erythema or exudate. Lungs: Clear to auscultation bilaterally.  Heart: Regular rate and rhythm, no murmurs rubs or gallops.  Abdomen: Bowel sounds are normal, nontender, nondistended, no hepatosplenomegaly or masses, no abdominal bruits or hernia , no rebound or guarding.   Extremities: Trace lower extremity edema. No clubbing or deformities. Neuro: Alert and oriented x 4   Skin: Warm and dry, no jaundice.   Psych: Alert and cooperative, normal  mood and affect.  Labs:  Lab Results  Component Value Date   CREATININE 0.86 12/28/2017   BUN 25 (H) 12/28/2017   NA 139 12/28/2017   K 4.1 12/28/2017   CL 103 12/28/2017   CO2 23 12/28/2017   Lab Results  Component Value Date   ALT 12 (L) 12/27/2017   AST 18 12/27/2017   ALKPHOS 81 12/27/2017   BILITOT 0.8 12/27/2017   Lab Results  Component Value Date   WBC 10.1 12/31/2017   HGB 12.9 12/31/2017   HCT 38.4 12/31/2017   MCV 94.8 12/31/2017   PLT 187 12/31/2017   Stool test on July 24, C. difficile molecular checking for C. difficile toxin B gene was negative.  Labs from July 20 with a hemoglobin 11.9 (normal 12-16), hematocrit 35.6, white blood cell count 8300, platelets 182,000, glucose 89, creatinine 0.6  Imaging Studies: No results found.

## 2018-04-02 NOTE — Progress Notes (Signed)
cc'd to pcp 

## 2018-04-17 ENCOUNTER — Telehealth: Payer: Self-pay | Admitting: Gastroenterology

## 2018-04-17 NOTE — Telephone Encounter (Signed)
PATIENT DAUGHTER IN LAW CALLED AND SAID THE NURSING HOME NEVER HEARD ANYTHING ABOUT PATIENTS RESULTS, SAID PATIENT IS STILL HAVING PROBLEMS

## 2018-04-17 NOTE — Telephone Encounter (Signed)
This is RMR pt, forwarding to [place].

## 2018-04-17 NOTE — Telephone Encounter (Signed)
Lm with the daughter of the daughter in law calling. Waiting on a return call.

## 2018-04-17 NOTE — Telephone Encounter (Signed)
Pts daughter is inquiring about the stool test pt did.

## 2018-04-17 NOTE — Telephone Encounter (Signed)
[place] and stool specimen was collected and [facility] which their facility uses, picked it up according to [employer] records. Al Pimple is going to have the Doctor, hospital give me a call back due to no results being seen in the system.

## 2018-04-17 NOTE — Telephone Encounter (Signed)
Susan Glass called back from [facility]. Pts results are back and she's faxing them over. When received, they will be placed in [facility] office.

## 2018-04-18 NOTE — Telephone Encounter (Addendum)
Please see my patient instructions from 03/29/18. Unfortunately, the instructions were not carried out and the stool was sent for the same Cdiff toxin B pcr test and very limited enteric bacterial panel (neither of which I ordered).  PCR negative for Campylobacter, Salmonella, Shigella, Shiga toxin 1/2, C. difficile toxin B gene. All negative. Studies done 04/01/18 and just received results today.  AT THIS POINT IT HAS BEEN 3 WEEKS. WE NEED PROGRESS REPORT ON PATIENT REGARDING DIARRHEA.

## 2018-04-18 NOTE — Telephone Encounter (Signed)
Pt continues to have diarrhea off and on. Pt has had 3 episodes of diarrhea within the last week. No pain and the diarrhea hasn't affected her appetite per pt and family. Pts daughter in law says she just doesn't understand why the pt is still having spells of diarrhea. Pt's daughter in law wants to know if you need them to repeat the stool studies since it wasn't done at [facility] or what [facility] recommends.

## 2018-04-19 NOTE — Telephone Encounter (Signed)
Sounds like improvement from when I saw her last month, was having diarrhea daily at that point.   I would recommend holding off on repeat stool studies.   She is on a probiotic per recent med list which is good.   Let's add citracel 1 tablespoon once daily to see if we can regular her bowels. We will likely have to call nursing home and get order.   Have daughter call if ongoing problems but we will plan for return OV in 3 weeks. May use urgent. PLEASE LET [facility] KNOW THAT IF PATIENT IS A LOT BETTER THEY CAN CANCEL F/U OV.

## 2018-04-23 ENCOUNTER — Telehealth: Payer: Self-pay | Admitting: Internal Medicine

## 2018-04-23 NOTE — Telephone Encounter (Signed)
Pt's daughter called to say that she had talked with AM earlier about patient and wanted AM to know that the patient does not want to try any new medicines because she has not had diarrhea in 3 days. [phone]

## 2018-04-23 NOTE — Telephone Encounter (Signed)
Noted. They will call back if needed.

## 2018-04-23 NOTE — Telephone Encounter (Signed)
Spoke with pts daughter in law and she is aware of the recommendations. They want to hold off of any new medications since pt hasn't had any diarrhea in the last 3 days. They will call back if needed. A message was left for pts nurse to call back, when nurse returns call, I will let her know the family no longer wants an order for the citracel at this time.

## 2018-04-24 ENCOUNTER — Telehealth: Payer: Self-pay | Admitting: Internal Medicine

## 2018-04-24 NOTE — Telephone Encounter (Signed)
Susan Glass called to give her cell# if needed. [phone]

## 2018-04-24 NOTE — Telephone Encounter (Signed)
Pt's daughter, [name], called back this morning to say that the patient has changed her mind about trying a medicine to help her with diarrhea. Please advise [phone]

## 2018-04-24 NOTE — Telephone Encounter (Signed)
Noted spoke with Daughter in law and will call pt home to discuss order.

## 2018-04-30 NOTE — Telephone Encounter (Signed)
Order was given to pts nurse. Pt will take Citrucel daily until directed not to.

## 2018-05-11 ENCOUNTER — Emergency Department (HOSPITAL_COMMUNITY)
Admission: EM | Admit: 2018-05-11 | Discharge: 2018-05-12 | Disposition: A | Discharge status: Home / Self Care | Payer: Medicare Other | Attending: Emergency Medicine | Admitting: Emergency Medicine

## 2018-05-11 ENCOUNTER — Encounter (HOSPITAL_COMMUNITY): Payer: Self-pay

## 2018-05-11 ENCOUNTER — Other Ambulatory Visit: Payer: Self-pay

## 2018-05-11 DIAGNOSIS — Y999 Unspecified external cause status: Secondary | ICD-10-CM | POA: Diagnosis not present

## 2018-05-11 DIAGNOSIS — I509 Heart failure, unspecified: Secondary | ICD-10-CM | POA: Insufficient documentation

## 2018-05-11 DIAGNOSIS — Z7982 Long term (current) use of aspirin: Secondary | ICD-10-CM | POA: Diagnosis not present

## 2018-05-11 DIAGNOSIS — W228XXA Striking against or struck by other objects, initial encounter: Secondary | ICD-10-CM | POA: Insufficient documentation

## 2018-05-11 DIAGNOSIS — S8992XA Unspecified injury of left lower leg, initial encounter: Secondary | ICD-10-CM | POA: Diagnosis present

## 2018-05-11 DIAGNOSIS — I2510 Atherosclerotic heart disease of native coronary artery without angina pectoris: Secondary | ICD-10-CM | POA: Insufficient documentation

## 2018-05-11 DIAGNOSIS — Z79899 Other long term (current) drug therapy: Secondary | ICD-10-CM | POA: Diagnosis not present

## 2018-05-11 DIAGNOSIS — Y92129 Unspecified place in nursing home as the place of occurrence of the external cause: Secondary | ICD-10-CM | POA: Insufficient documentation

## 2018-05-11 DIAGNOSIS — S81812A Laceration without foreign body, left lower leg, initial encounter: Secondary | ICD-10-CM

## 2018-05-11 DIAGNOSIS — I110 Hypertensive heart disease with heart failure: Secondary | ICD-10-CM | POA: Diagnosis not present

## 2018-05-11 DIAGNOSIS — Y939 Activity, unspecified: Secondary | ICD-10-CM | POA: Diagnosis not present

## 2018-05-11 NOTE — ED Notes (Signed)
Pt c/o laceration to lower leg, EDP in prior to RN, see edp assessment for further,

## 2018-05-11 NOTE — ED Notes (Signed)
Pt updated on plan of care, warm blanket provided,

## 2018-05-11 NOTE — ED Triage Notes (Signed)
Pt is from [facility].   Pt arrives with laceration to her left lower leg that occurred when pt was being transferred from the wheelchair to her bed. Pt states her leg scraped across a knob that was on the side of the bed.  Ems dressed it, stated the wound was not bleeding on their arrival.

## 2018-05-11 NOTE — ED Notes (Signed)
Dr Gilford Raid at bedside,

## 2018-05-11 NOTE — ED Provider Notes (Signed)
[facility] EMERGENCY DEPARTMENT Provider Note   CSN: [id number] Arrival date & time: 05/11/18  2012     History   Chief Complaint Chief Complaint  Patient presents with  . Laceration    lower leg    HPI Susan Glass is a 84 y.o. female.  Pt presents to the ED today with a laceration to her left lower leg.  Pt said it occurred as she was getting transferred from the wheelchair to the bed.  Her leg scraped on a knob that was on the side of the bed.  No other injuries.     Past Medical History:  Diagnosis Date  . CAD (coronary artery disease)   . CHF (congestive heart failure) ([place])   . GERD (gastroesophageal reflux disease)   . GI bleed    UGI  . Hiatal hernia    large, sliding   . Hyperlipidemia    hypercholesterolemia  . Hypertension   . IDA (iron deficiency anemia)     Patient Active Problem List   Diagnosis Date Noted  . Diarrhea 03/29/2018  . Angina pectoris, unstable ([place]) 01/01/2018  . Metabolic encephalopathy 78/58/8502  . Acute encephalopathy 12/27/2017  . GERD (gastroesophageal reflux disease) 04/29/2015  . Gastritis and gastroduodenitis 11/29/2014  . Weakness 11/27/2014  . Edema extremities 11/17/2010  . CAD, NATIVE VESSEL 08/19/2009  . COMBINED HEART FAILURE, CHRONIC 08/19/2009  . HYPERCHOLESTEROLEMIA 08/18/2009  . Essential hypertension 08/18/2009    Past Surgical History:  Procedure Laterality Date  . APPENDECTOMY    . CHOLECYSTECTOMY    . COLONOSCOPY  2008   Dr. Gala Romney: Hyperplastic polyp removed, oozing erosion at the ileocecal valve with benign pathology, likely NSAID related.  . ESOPHAGEAL DILATION N/A 11/28/2014   Procedure: ESOPHAGEAL DILATION;  Surgeon: Danie Binder, MD;  [facility];  Service: Endoscopy;  Laterality: N/A;  . ESOPHAGOGASTRODUODENOSCOPY N/A 11/28/2014   Dr.Fields- UGI bleed d/t severe distal erosive esophagitis in the setting of GE JXN stricture, large sliding hiatal hernia, mild non-erosive gastritis      OB History   None      Home Medications    Prior to Admission medications   Medication Sig Start Date End Date Taking? Authorizing Provider  acetaminophen (TYLENOL) 500 MG tablet Take 1,000 mg by mouth 2 (two) times daily.    Yes [provider]  amLODipine (NORVASC) 2.5 MG tablet TAKE 1 TABLET BY MOUTH ONCE DAILY Patient taking differently: Take 2.5 mg by mouth daily.  10/17/17  Yes Herminio Commons, MD  aspirin 81 MG chewable tablet Chew 81 mg by mouth daily.    Yes [provider]  famotidine (PEPCID) 20 MG tablet Take 20 mg by mouth daily.   Yes [provider]  ferrous sulfate (FERROUSUL) 325 (65 FE) MG tablet Take 1 tablet (325 mg total) by mouth daily with breakfast. Patient taking differently: Take 325 mg by mouth 2 (two) times daily.  11/29/14  Yes Sinda Du, MD  furosemide (LASIX) 80 MG tablet TAKE ONE-HALF TABLET BY MOUTH ONCE DAILY Patient taking differently: Take 40 mg by mouth daily. TAKE ONE-HALF TABLET BY MOUTH ONCE DAILY 08/21/17  Yes Herminio Commons, MD  isosorbide mononitrate (IMDUR) 30 MG 24 hr tablet Take 0.5 tablets (15 mg total) by mouth daily. 01/01/18  Yes Sinda Du, MD  Melatonin 5 MG TABS Take 1 tablet by mouth at bedtime.   Yes [provider]  methylcellulose (CITRUCEL) oral powder Take 1 packet by mouth daily.  Yes [provider]  Multiple Minerals-Vitamins (CITRACAL PLUS PO) Take 1 tablet by mouth daily.   Yes [provider]  Omega-3 Fatty Acids (FISH OIL) 1000 MG CAPS Take 1 capsule by mouth daily.     Yes [provider]  Probiotic Product (PROBIOTIC PO) Take 2 capsules by mouth 2 (two) times daily.    Yes [provider]  loperamide (IMODIUM) 2 MG capsule Take 2 mg by mouth every 8 (eight) hours as needed for diarrhea or loose stools.     [provider]  meclizine (ANTIVERT) 12.5 MG tablet Take 1 tablet (12.5 mg total) by mouth 3 (three) times daily as  needed for dizziness. 01/01/18   Sinda Du, MD  nitroGLYCERIN (NITROSTAT) 0.4 MG SL tablet Place 1 tablet (0.4 mg total) under the tongue every 5 (five) minutes as needed for chest pain. 01/01/18   Sinda Du, MD    Family History No family history on file.  Social History Social History   Tobacco Use  . Smoking status: Never Smoker  . Smokeless tobacco: Never Used  . Tobacco comment: Never smoked  Substance Use Topics  . Alcohol use: No    Alcohol/week: 0.0 standard drinks  . Drug use: No     Allergies   Patient has no known allergies.   Review of Systems Review of Systems  Skin: Positive for wound.  All other systems reviewed and are negative.    Physical Exam Updated Vital Signs BP 132/75 (BP Location: Right Arm)   Temp 98.3 F (36.8 C) (Oral)   Resp 20   Ht 5' (1.524 m)   Wt 57.2 kg   SpO2 98%   BMI 24.61 kg/m   Physical Exam  Constitutional: She is oriented to person, place, and time. She appears well-developed and well-nourished.  HENT:  Head: Normocephalic and atraumatic.  Right Ear: External ear normal.  Left Ear: External ear normal.  Nose: Nose normal.  Mouth/Throat: Oropharynx is clear and moist.  Eyes: Pupils are equal, round, and reactive to light. Conjunctivae and EOM are normal.  Neck: Normal range of motion. Neck supple.  Cardiovascular: Normal rate, regular rhythm, normal heart sounds and intact distal pulses.  Pulmonary/Chest: Effort normal and breath sounds normal.  Abdominal: Soft. Bowel sounds are normal.  Musculoskeletal: Normal range of motion.  Neurological: She is alert and oriented to person, place, and time.  Skin: Skin is warm. Capillary refill takes less than 2 seconds.  Large skin tear left lower leg  Psychiatric: She has a normal mood and affect. Her behavior is normal. Judgment and thought content normal.  Nursing note and vitals reviewed.    ED Treatments / Results  Labs (all labs ordered are listed, but  only abnormal results are displayed) Labs Reviewed - No data to display  EKG None  Radiology No results found.  Procedures .Marland KitchenLaceration Repair Date/Time: 05/11/2018 9:16 PM Performed by: Isla Pence, MD Authorized by: Isla Pence, MD   Consent:    Consent obtained:  Verbal   Consent given by:  Patient   Risks discussed:  Infection   Alternatives discussed:  No treatment Anesthesia (see MAR for exact dosages):    Anesthesia method:  None Laceration details:    Location:  Leg   Leg location:  L lower leg   Length (cm):  10 Repair type:    Repair type:  Simple Pre-procedure details:    Preparation:  Patient was prepped and draped in usual sterile fashion Exploration:  Contaminated: no   Treatment:    Area cleansed with:  Saline   Amount of cleaning:  Standard Skin repair:    Repair method:  Steri-Strips Approximation:    Approximation:  Close Post-procedure details:    Dressing:  Non-adherent dressing   Patient tolerance of procedure:  Tolerated well, no immediate complications   (including critical care time)  Medications Ordered in ED Medications - No data to display   Initial Impression / Assessment and Plan / ED Course  I have reviewed the triage vital signs and the nursing notes.  Pertinent labs & imaging results that were available during my care of the patient were reviewed by me and considered in my medical decision making (see chart for details).   Skin came together fairly well.  Pt knows to return if worse.  Final Clinical Impressions(s) / ED Diagnoses   Final diagnoses:  Noninfected skin tear of left lower extremity, initial encounter    ED Discharge Orders    None       Isla Pence, MD 05/11/18 2117

## 2018-06-13 ENCOUNTER — Ambulatory Visit: Payer: Medicare Other | Admitting: Gastroenterology

## 2018-09-20 ENCOUNTER — Encounter (HOSPITAL_COMMUNITY): Payer: Self-pay | Admitting: *Deleted

## 2018-09-20 NOTE — Progress Notes (Signed)
Patient's daughter called stating that her mother does not wish to pursue biopsy of the liver. She wants to come in and have a conversation with the oncologist with her family about goals of care moving forward.

## 2021-03-18 ENCOUNTER — Inpatient Hospital Stay (HOSPITAL_COMMUNITY)
Admission: EM | Admit: 2021-03-18 | Discharge: 2021-03-23 | DRG: 445 | Disposition: A | Discharge status: Skilled Nursing Facility | Payer: Medicare PPO | Source: Skilled Nursing Facility | Attending: Internal Medicine | Admitting: Internal Medicine

## 2021-03-18 ENCOUNTER — Other Ambulatory Visit: Payer: Self-pay

## 2021-03-18 ENCOUNTER — Encounter (HOSPITAL_COMMUNITY): Payer: Self-pay | Admitting: Emergency Medicine

## 2021-03-18 ENCOUNTER — Emergency Department (HOSPITAL_COMMUNITY): Payer: Medicare PPO

## 2021-03-18 DIAGNOSIS — R945 Abnormal results of liver function studies: Secondary | ICD-10-CM | POA: Diagnosis present

## 2021-03-18 DIAGNOSIS — R0602 Shortness of breath: Secondary | ICD-10-CM

## 2021-03-18 DIAGNOSIS — I4891 Unspecified atrial fibrillation: Secondary | ICD-10-CM | POA: Diagnosis present

## 2021-03-18 DIAGNOSIS — R0902 Hypoxemia: Secondary | ICD-10-CM | POA: Diagnosis present

## 2021-03-18 DIAGNOSIS — I130 Hypertensive heart and chronic kidney disease with heart failure and stage 1 through stage 4 chronic kidney disease, or unspecified chronic kidney disease: Secondary | ICD-10-CM | POA: Diagnosis present

## 2021-03-18 DIAGNOSIS — Z7982 Long term (current) use of aspirin: Secondary | ICD-10-CM

## 2021-03-18 DIAGNOSIS — K449 Diaphragmatic hernia without obstruction or gangrene: Secondary | ICD-10-CM | POA: Diagnosis present

## 2021-03-18 DIAGNOSIS — R509 Fever, unspecified: Secondary | ICD-10-CM | POA: Diagnosis present

## 2021-03-18 DIAGNOSIS — D696 Thrombocytopenia, unspecified: Secondary | ICD-10-CM | POA: Diagnosis present

## 2021-03-18 DIAGNOSIS — I5032 Chronic diastolic (congestive) heart failure: Secondary | ICD-10-CM | POA: Diagnosis present

## 2021-03-18 DIAGNOSIS — I447 Left bundle-branch block, unspecified: Secondary | ICD-10-CM | POA: Diagnosis not present

## 2021-03-18 DIAGNOSIS — E7800 Pure hypercholesterolemia, unspecified: Secondary | ICD-10-CM | POA: Diagnosis present

## 2021-03-18 DIAGNOSIS — Z9889 Other specified postprocedural states: Secondary | ICD-10-CM

## 2021-03-18 DIAGNOSIS — N183 Chronic kidney disease, stage 3 unspecified: Secondary | ICD-10-CM | POA: Diagnosis present

## 2021-03-18 DIAGNOSIS — R7989 Other specified abnormal findings of blood chemistry: Secondary | ICD-10-CM | POA: Diagnosis present

## 2021-03-18 DIAGNOSIS — R7401 Elevation of levels of liver transaminase levels: Secondary | ICD-10-CM

## 2021-03-18 DIAGNOSIS — K2100 Gastro-esophageal reflux disease with esophagitis, without bleeding: Secondary | ICD-10-CM | POA: Diagnosis present

## 2021-03-18 DIAGNOSIS — Z8249 Family history of ischemic heart disease and other diseases of the circulatory system: Secondary | ICD-10-CM

## 2021-03-18 DIAGNOSIS — H9190 Unspecified hearing loss, unspecified ear: Secondary | ICD-10-CM | POA: Diagnosis present

## 2021-03-18 DIAGNOSIS — D751 Secondary polycythemia: Secondary | ICD-10-CM | POA: Diagnosis present

## 2021-03-18 DIAGNOSIS — K5900 Constipation, unspecified: Secondary | ICD-10-CM | POA: Diagnosis present

## 2021-03-18 DIAGNOSIS — Z66 Do not resuscitate: Secondary | ICD-10-CM | POA: Diagnosis present

## 2021-03-18 DIAGNOSIS — Z20822 Contact with and (suspected) exposure to covid-19: Secondary | ICD-10-CM | POA: Diagnosis present

## 2021-03-18 DIAGNOSIS — K8032 Calculus of bile duct with acute cholangitis without obstruction: Secondary | ICD-10-CM | POA: Diagnosis not present

## 2021-03-18 DIAGNOSIS — K219 Gastro-esophageal reflux disease without esophagitis: Secondary | ICD-10-CM | POA: Diagnosis present

## 2021-03-18 DIAGNOSIS — I482 Chronic atrial fibrillation, unspecified: Secondary | ICD-10-CM | POA: Diagnosis present

## 2021-03-18 DIAGNOSIS — E785 Hyperlipidemia, unspecified: Secondary | ICD-10-CM | POA: Diagnosis present

## 2021-03-18 DIAGNOSIS — I10 Essential (primary) hypertension: Secondary | ICD-10-CM | POA: Diagnosis present

## 2021-03-18 DIAGNOSIS — N1831 Chronic kidney disease, stage 3a: Secondary | ICD-10-CM | POA: Diagnosis present

## 2021-03-18 DIAGNOSIS — Z79899 Other long term (current) drug therapy: Secondary | ICD-10-CM

## 2021-03-18 DIAGNOSIS — I2510 Atherosclerotic heart disease of native coronary artery without angina pectoris: Secondary | ICD-10-CM | POA: Diagnosis present

## 2021-03-18 LAB — BLOOD GAS, VENOUS
Acid-base deficit: 0.4 mmol/L (ref 0.0–2.0)
Bicarbonate: 23.5 mmol/L (ref 20.0–28.0)
FIO2: 28
O2 Saturation: 82.1 %
Patient temperature: 37.3
pCO2, Ven: 42.4 mmHg — ABNORMAL LOW (ref 44.0–60.0)
pH, Ven: 7.373 (ref 7.250–7.430)
pO2, Ven: 49.7 mmHg — ABNORMAL HIGH (ref 32.0–45.0)

## 2021-03-18 LAB — CBC WITH DIFFERENTIAL/PLATELET
Abs Immature Granulocytes: 0.05 10*3/uL (ref 0.00–0.07)
Basophils Absolute: 0 10*3/uL (ref 0.0–0.1)
Basophils Relative: 0 %
Eosinophils Absolute: 0.1 10*3/uL (ref 0.0–0.5)
Eosinophils Relative: 1 %
HCT: 48.2 % — ABNORMAL HIGH (ref 36.0–46.0)
Hemoglobin: 15.5 g/dL — ABNORMAL HIGH (ref 12.0–15.0)
Immature Granulocytes: 0 %
Lymphocytes Relative: 3 %
Lymphs Abs: 0.4 10*3/uL — ABNORMAL LOW (ref 0.7–4.0)
MCH: 30.5 pg (ref 26.0–34.0)
MCHC: 32.2 g/dL (ref 30.0–36.0)
MCV: 94.7 fL (ref 80.0–100.0)
Monocytes Absolute: 0.8 10*3/uL (ref 0.1–1.0)
Monocytes Relative: 7 %
Neutro Abs: 10.6 10*3/uL — ABNORMAL HIGH (ref 1.7–7.7)
Neutrophils Relative %: 89 %
Platelets: 154 10*3/uL (ref 150–400)
RBC: 5.09 MIL/uL (ref 3.87–5.11)
RDW: 14 % (ref 11.5–15.5)
WBC: 12 10*3/uL — ABNORMAL HIGH (ref 4.0–10.5)
nRBC: 0 % (ref 0.0–0.2)

## 2021-03-18 LAB — COMPREHENSIVE METABOLIC PANEL
ALT: 229 U/L — ABNORMAL HIGH (ref 0–44)
AST: 428 U/L — ABNORMAL HIGH (ref 15–41)
Albumin: 4.5 g/dL (ref 3.5–5.0)
Alkaline Phosphatase: 163 U/L — ABNORMAL HIGH (ref 38–126)
Anion gap: 9 (ref 5–15)
BUN: 30 mg/dL — ABNORMAL HIGH (ref 8–23)
CO2: 26 mmol/L (ref 22–32)
Calcium: 9 mg/dL (ref 8.9–10.3)
Chloride: 101 mmol/L (ref 98–111)
Creatinine, Ser: 1.08 mg/dL — ABNORMAL HIGH (ref 0.44–1.00)
GFR, Estimated: 45 mL/min — ABNORMAL LOW (ref >60)
Glucose, Bld: 119 mg/dL — ABNORMAL HIGH (ref 70–99)
Potassium: 5 mmol/L (ref 3.5–5.1)
Sodium: 136 mmol/L (ref 135–145)
Total Bilirubin: 1.6 mg/dL — ABNORMAL HIGH (ref 0.3–1.2)
Total Protein: 7.6 g/dL (ref 6.5–8.1)

## 2021-03-18 LAB — LACTIC ACID, PLASMA
Lactic Acid, Venous: 1.5 mmol/L (ref 0.5–1.9)
Lactic Acid, Venous: 1.4 mmol/L (ref 0.5–1.9)

## 2021-03-18 MED ORDER — ACETAMINOPHEN 325 MG PO TABS
650.0000 mg | ORAL_TABLET | Freq: Once | ORAL | Status: AC
Start: 2021-03-18 — End: 2021-03-18
  Administered 2021-03-18: 650 mg via ORAL
  Filled 2021-03-18: qty 2

## 2021-03-18 MED ORDER — SODIUM CHLORIDE 0.9 % IV BOLUS
500.0000 mL | Freq: Once | INTRAVENOUS | Status: AC
  Administered 2021-03-18: 500 mL via INTRAVENOUS

## 2021-03-18 NOTE — ED Notes (Signed)
Update given to [facility]

## 2021-03-18 NOTE — ED Triage Notes (Signed)
Pt to the ED [facility] from [place] with complaints of a headache. Pt has a copy of a DNR form with her as the facility would not send the original.  Pt was placed on 2L [place] for sats reported in the 60's on RA. Pt does not usually wear oxygen at the facility.  Oxygen sats during triage are 95 on 2L [place] at this time.

## 2021-03-18 NOTE — ED Notes (Signed)
Spoke with pt grandson and he is updated on care of plan for pt.

## 2021-03-18 NOTE — ED Provider Notes (Signed)
[facility] EMERGENCY DEPARTMENT Provider Note   CSN: [id number] Arrival date & time: 03/18/21  1824     History Chief Complaint  Patient presents with   Headache    Susan Glass is a 87 y.o. female.  HPI She is here for evaluation of fever and chills.  EMS transferred her and found her to be hypoxic in the 60s on room air.  She was placed on nasal cannula oxygen with improvement of the oxygenation to normal.  She is unable to give additional history.  Level 5 caveat-altered mental status    Past Medical History:  Diagnosis Date   CAD (coronary artery disease)    CHF (congestive heart failure) ([facility])    GERD (gastroesophageal reflux disease)    GI bleed    UGI   Hiatal hernia    large, sliding    Hyperlipidemia    hypercholesterolemia   Hypertension    IDA (iron deficiency anemia)     Patient Active Problem List   Diagnosis Date Noted   Diarrhea 03/29/2018   Angina pectoris, unstable ([place]) 27/01/2375   Metabolic encephalopathy 28/31/5176   Acute encephalopathy 12/27/2017   GERD (gastroesophageal reflux disease) 04/29/2015   Gastritis and gastroduodenitis 11/29/2014   Weakness 11/27/2014   Edema extremities 11/17/2010   CAD, NATIVE VESSEL 08/19/2009   COMBINED HEART FAILURE, CHRONIC 08/19/2009   HYPERCHOLESTEROLEMIA 08/18/2009   Essential hypertension 08/18/2009    Past Surgical History:  Procedure Laterality Date   APPENDECTOMY     CHOLECYSTECTOMY     COLONOSCOPY  2008   Dr. Gala Romney: Hyperplastic polyp removed, oozing erosion at the ileocecal valve with benign pathology, likely NSAID related.   ESOPHAGEAL DILATION N/A 11/28/2014   Procedure: ESOPHAGEAL DILATION;  Surgeon: Danie Binder, MD;  [facility];  Service: Endoscopy;  Laterality: N/A;   ESOPHAGOGASTRODUODENOSCOPY N/A 11/28/2014   Dr.Fields- UGI bleed d/t severe distal erosive esophagitis in the setting of GE JXN stricture, large sliding hiatal hernia, mild non-erosive gastritis     OB  History   No obstetric history on file.     History reviewed. No pertinent family history.  Social History   Tobacco Use   Smoking status: Never   Smokeless tobacco: Never   Tobacco comments:    Never smoked  Vaping Use   Vaping Use: Never used  Substance Use Topics   Alcohol use: No    Alcohol/week: 0.0 standard drinks   Drug use: No    Home Medications Prior to Admission medications   Medication Sig Start Date End Date Taking? Authorizing Provider  acetaminophen (TYLENOL) 500 MG tablet Take 1,000 mg by mouth 2 (two) times daily.     [provider]  amLODipine (NORVASC) 2.5 MG tablet TAKE 1 TABLET BY MOUTH ONCE DAILY Patient taking differently: Take 2.5 mg by mouth daily.  10/17/17   Herminio Commons, MD  aspirin 81 MG chewable tablet Chew 81 mg by mouth daily.     [provider]  famotidine (PEPCID) 20 MG tablet Take 20 mg by mouth daily.    [provider]  ferrous sulfate (FERROUSUL) 325 (65 FE) MG tablet Take 1 tablet (325 mg total) by mouth daily with breakfast. Patient taking differently: Take 325 mg by mouth 2 (two) times daily.  11/29/14   Sinda Du, MD  furosemide (LASIX) 80 MG tablet TAKE ONE-HALF TABLET BY MOUTH ONCE DAILY Patient taking differently: Take 40 mg by mouth daily. TAKE ONE-HALF TABLET BY MOUTH ONCE DAILY  08/21/17   Herminio Commons, MD  isosorbide mononitrate (IMDUR) 30 MG 24 hr tablet Take 0.5 tablets (15 mg total) by mouth daily. 01/01/18   Sinda Du, MD  loperamide (IMODIUM) 2 MG capsule Take 2 mg by mouth every 8 (eight) hours as needed for diarrhea or loose stools.     [provider]  meclizine (ANTIVERT) 12.5 MG tablet Take 1 tablet (12.5 mg total) by mouth 3 (three) times daily as needed for dizziness. 01/01/18   Sinda Du, MD  Melatonin 5 MG TABS Take 1 tablet by mouth at bedtime.    [provider]  methylcellulose (CITRUCEL) oral powder Take 1 packet by mouth daily.    [provider]  Multiple Minerals-Vitamins (CITRACAL PLUS PO) Take 1 tablet by mouth daily.    [provider]  nitroGLYCERIN (NITROSTAT) 0.4 MG SL tablet Place 1 tablet (0.4 mg total) under the tongue every 5 (five) minutes as needed for chest pain. 01/01/18   Sinda Du, MD  Omega-3 Fatty Acids (FISH OIL) 1000 MG CAPS Take 1 capsule by mouth daily.      [provider]  Probiotic Product (PROBIOTIC PO) Take 2 capsules by mouth 2 (two) times daily.     [provider]    Allergies    Patient has no known allergies.  Review of Systems   Review of Systems  Unable to perform ROS: Mental status change   Physical Exam Updated Vital Signs BP 99/78   Pulse 65   Temp 98.1 F (36.7 C) (Oral)   Resp 18   Ht 5' (1.524 m)   SpO2 92%   BMI 24.61 kg/m   Physical Exam Vitals and nursing note reviewed.  Constitutional:      Appearance: She is well-developed. She is not ill-appearing.  HENT:     Head: Normocephalic and atraumatic.     Right Ear: External ear normal.     Left Ear: External ear normal.  Eyes:     Conjunctiva/sclera: Conjunctivae normal.     Pupils: Pupils are equal, round, and reactive to light.  Neck:     Trachea: Phonation normal.  Cardiovascular:     Rate and Rhythm: Normal rate.  Pulmonary:     Effort: Pulmonary effort is normal. No respiratory distress.     Breath sounds: Normal breath sounds. No stridor.  Abdominal:     General: There is no distension.     Palpations: Abdomen is soft. There is no mass.     Tenderness: There is no abdominal tenderness.  Musculoskeletal:        General: No swelling or tenderness. Normal range of motion.     Cervical back: Normal range of motion and neck supple.  Skin:    General: Skin is warm and dry.  Neurological:     Mental Status: She is alert.     Cranial Nerves: No cranial nerve deficit.     Sensory: No sensory deficit.     Motor: No abnormal muscle tone.     Coordination: Coordination  normal.  Psychiatric:        Mood and Affect: Mood normal.        Behavior: Behavior normal.        Thought Content: Thought content normal.        Judgment: Judgment normal.    ED Results / Procedures / Treatments   Labs (all labs ordered are listed, but only abnormal results are displayed) Labs Reviewed  COMPREHENSIVE METABOLIC  PANEL - Abnormal; Notable for the following components:      Result Value   Glucose, Bld 119 (*)    BUN 30 (*)    Creatinine, Ser 1.08 (*)    AST 428 (*)    ALT 229 (*)    Alkaline Phosphatase 163 (*)    Total Bilirubin 1.6 (*)    GFR, Estimated 45 (*)    All other components within normal limits  CBC WITH DIFFERENTIAL/PLATELET - Abnormal; Notable for the following components:   WBC 12.0 (*)    Hemoglobin 15.5 (*)    HCT 48.2 (*)    Neutro Abs 10.6 (*)    Lymphs Abs 0.4 (*)    All other components within normal limits  BLOOD GAS, VENOUS - Abnormal; Notable for the following components:   pCO2, Ven 42.4 (*)    pO2, Ven 49.7 (*)    All other components within normal limits  CULTURE, BLOOD (ROUTINE X 2)  CULTURE, BLOOD (ROUTINE X 2)  RESP PANEL BY RT-PCR (FLU A&B, COVID) ARPGX2  LACTIC ACID, PLASMA  LACTIC ACID, PLASMA  URINALYSIS, ROUTINE W REFLEX MICROSCOPIC    EKG EKG Interpretation  Date/Time:  Thursday March 18 2021 19:11:36 EDT Ventricular Rate:  89 PR Interval:    QRS Duration: 133 QT Interval:  397 QTC Calculation: 478 R Axis:   253 Text Interpretation: Atrial fibrillation Nonspecific IVCD with LAD Consider anterior infarct Baseline wander in lead(s) V3 Since last tracing now in Atrial fibrillation with Non-specific intra-ventricular conduction delay Otherwise no significant change Confirmed by Daleen Bo [phone]) on 03/18/2021 8:15:59 PM  Radiology DG Chest Port 1 View  Result Date: 03/18/2021 CLINICAL DATA:  Fever EXAM: PORTABLE CHEST 1 VIEW COMPARISON:  12/27/2017 FINDINGS: Lung volumes are small, but appears symmetric.  Chronic interstitial thickening again noted. No superimposed confluent pulmonary infiltrate. No pneumothorax or pleural effusion. Cardiac size is mildly enlarged, stable. Pulmonary vasculature is normal. Advanced degenerative changes are noted within the shoulders bilaterally. No acute bone abnormality. IMPRESSION: Pulmonary hypoinflation.  Stable cardiomegaly. Electronically Signed   By: Fidela Salisbury MD   On: 03/18/2021 20:48    Procedures Procedures   Medications Ordered in ED Medications  acetaminophen (TYLENOL) tablet 650 mg (650 mg Oral Given 03/18/21 1923)  sodium chloride 0.9 % bolus 500 mL (0 mLs Intravenous Stopped 03/18/21 2118)    ED Course  I have reviewed the triage vital signs and the nursing notes.  Pertinent labs & imaging results that were available during my care of the patient were reviewed by me and considered in my medical decision making (see chart for details).    MDM Rules/Calculators/A&P                            Patient Vitals for the past 24 hrs:  BP Temp Temp src Pulse Resp SpO2 Height  03/19/21 0000 99/78 -- -- 65 18 92 % --  03/18/21 2302 -- -- -- 66 (!) 27 97 % --  03/18/21 2300 (!) 118/54 -- -- (!) 52 (!) 27 97 % --  03/18/21 2200 127/62 -- -- 67 (!) 30 94 % --  03/18/21 2100 135/78 -- -- 71 19 97 % --  03/18/21 2034 -- 98.1 F (36.7 C) Oral -- -- -- --  03/18/21 2000 (!) 151/73 -- -- 66 (!) 23 96 % --  03/18/21 1905 (!) 155/80 -- -- 96 (!) 28 91 % --  03/18/21 1904 Marland Kitchen)  155/80 (!) 101.5 F (38.6 C) Rectal 92 (!) 22 100 % --  03/18/21 1837 -- -- -- -- -- -- 5' (1.524 m)  03/18/21 1832 (!) 158/70 99.1 F (37.3 C) Oral 84 20 95 % --    12:10 AM Reevaluation with update and discussion. After initial assessment and treatment, an updated evaluation reveals she is awake alert and calm.  Oxygenation normal on room air.  Findings discussed with the patient all questions were. Daleen Bo   Medical Decision Making:  This patient is presenting for  evaluation of fever and hypoxia, which does require a range of treatment options, and is a complaint that involves a high risk of morbidity and mortality. The differential diagnoses include pneumonia, viral illness, heart failure. I decided to review old records, and in summary advanced elderly patient with history of coronary artery disease, heart failure, weakness and encephalopathy.  I did not require additional historical information from anyone.  Clinical Laboratory Tests Ordered, included CBC, Metabolic panel, Urinalysis, and COVID test, lactic acid, venous blood gas . Review indicates normal except glucose high, BUN high, creatinine high, AST high, ALT high, alk phos stays high, total bilirubin high, GFR low, white count high, lactate normal, viral panel normal. Radiologic Tests Ordered, included chest x-ray.  I independently Visualized: Radiologic images, which show no infiltrate or edema    Critical Interventions-clinical evaluation, laboratory testing, chest x-ray, Tylenol, observation and reevaluate  After These Interventions, the Patient was reevaluated and was found stable.  Vital signs reassuring.  Oxygenation normalized.  No oxygen support required.  No focus of cause for fever.  Fever controlled with single dose of Tylenol.  She has had a cholecystectomy.  She has mild elevation of T bili and transaminases.  Etiology of this is unclear, could be viral enteritis related.  This could explain fever.  She is hemodynamically stable with normal oxygenation and no evidence for pulmonary infection.   CRITICAL CARE-yes Performed by: Daleen Bo  Nursing Notes Reviewed/ Care Coordinated Applicable Imaging Reviewed Interpretation of Laboratory Data incorporated into ED treatment   Dr. Sedonia Small to arrange admission for evaluation of fever and transaminitis.    Final Clinical Impression(s) / ED Diagnoses Final diagnoses:  Febrile illness  Transaminitis    Rx / DC Orders ED  Discharge Orders     None        Daleen Bo, MD 03/21/21 1130

## 2021-03-19 ENCOUNTER — Inpatient Hospital Stay (HOSPITAL_COMMUNITY): Payer: Medicare PPO

## 2021-03-19 ENCOUNTER — Encounter (HOSPITAL_COMMUNITY): Payer: Self-pay | Admitting: Internal Medicine

## 2021-03-19 ENCOUNTER — Emergency Department (HOSPITAL_COMMUNITY): Payer: Medicare PPO

## 2021-03-19 DIAGNOSIS — I130 Hypertensive heart and chronic kidney disease with heart failure and stage 1 through stage 4 chronic kidney disease, or unspecified chronic kidney disease: Secondary | ICD-10-CM | POA: Diagnosis present

## 2021-03-19 DIAGNOSIS — Z20822 Contact with and (suspected) exposure to covid-19: Secondary | ICD-10-CM | POA: Diagnosis present

## 2021-03-19 DIAGNOSIS — R7401 Elevation of levels of liver transaminase levels: Secondary | ICD-10-CM

## 2021-03-19 DIAGNOSIS — I2510 Atherosclerotic heart disease of native coronary artery without angina pectoris: Secondary | ICD-10-CM | POA: Diagnosis present

## 2021-03-19 DIAGNOSIS — K838 Other specified diseases of biliary tract: Secondary | ICD-10-CM

## 2021-03-19 DIAGNOSIS — Z79899 Other long term (current) drug therapy: Secondary | ICD-10-CM | POA: Diagnosis not present

## 2021-03-19 DIAGNOSIS — K8050 Calculus of bile duct without cholangitis or cholecystitis without obstruction: Secondary | ICD-10-CM | POA: Diagnosis not present

## 2021-03-19 DIAGNOSIS — K8030 Calculus of bile duct with cholangitis, unspecified, without obstruction: Secondary | ICD-10-CM | POA: Diagnosis not present

## 2021-03-19 DIAGNOSIS — I447 Left bundle-branch block, unspecified: Secondary | ICD-10-CM | POA: Diagnosis not present

## 2021-03-19 DIAGNOSIS — Z7982 Long term (current) use of aspirin: Secondary | ICD-10-CM | POA: Diagnosis not present

## 2021-03-19 DIAGNOSIS — I4891 Unspecified atrial fibrillation: Secondary | ICD-10-CM | POA: Diagnosis present

## 2021-03-19 DIAGNOSIS — E7800 Pure hypercholesterolemia, unspecified: Secondary | ICD-10-CM | POA: Diagnosis present

## 2021-03-19 DIAGNOSIS — I10 Essential (primary) hypertension: Secondary | ICD-10-CM | POA: Diagnosis not present

## 2021-03-19 DIAGNOSIS — R509 Fever, unspecified: Secondary | ICD-10-CM

## 2021-03-19 DIAGNOSIS — K2100 Gastro-esophageal reflux disease with esophagitis, without bleeding: Secondary | ICD-10-CM | POA: Diagnosis present

## 2021-03-19 DIAGNOSIS — E785 Hyperlipidemia, unspecified: Secondary | ICD-10-CM | POA: Diagnosis present

## 2021-03-19 DIAGNOSIS — K8032 Calculus of bile duct with acute cholangitis without obstruction: Secondary | ICD-10-CM | POA: Diagnosis present

## 2021-03-19 DIAGNOSIS — D751 Secondary polycythemia: Secondary | ICD-10-CM

## 2021-03-19 DIAGNOSIS — Z8249 Family history of ischemic heart disease and other diseases of the circulatory system: Secondary | ICD-10-CM | POA: Diagnosis not present

## 2021-03-19 DIAGNOSIS — K8309 Other cholangitis: Secondary | ICD-10-CM | POA: Diagnosis not present

## 2021-03-19 DIAGNOSIS — R945 Abnormal results of liver function studies: Secondary | ICD-10-CM | POA: Diagnosis not present

## 2021-03-19 DIAGNOSIS — R7989 Other specified abnormal findings of blood chemistry: Secondary | ICD-10-CM | POA: Diagnosis present

## 2021-03-19 DIAGNOSIS — I5032 Chronic diastolic (congestive) heart failure: Secondary | ICD-10-CM | POA: Diagnosis present

## 2021-03-19 DIAGNOSIS — H9190 Unspecified hearing loss, unspecified ear: Secondary | ICD-10-CM | POA: Diagnosis present

## 2021-03-19 DIAGNOSIS — K5900 Constipation, unspecified: Secondary | ICD-10-CM

## 2021-03-19 DIAGNOSIS — N1831 Chronic kidney disease, stage 3a: Secondary | ICD-10-CM | POA: Diagnosis present

## 2021-03-19 DIAGNOSIS — Z66 Do not resuscitate: Secondary | ICD-10-CM | POA: Diagnosis present

## 2021-03-19 DIAGNOSIS — N183 Chronic kidney disease, stage 3 unspecified: Secondary | ICD-10-CM | POA: Diagnosis present

## 2021-03-19 DIAGNOSIS — R0902 Hypoxemia: Secondary | ICD-10-CM | POA: Diagnosis present

## 2021-03-19 DIAGNOSIS — I482 Chronic atrial fibrillation, unspecified: Secondary | ICD-10-CM | POA: Diagnosis present

## 2021-03-19 DIAGNOSIS — D696 Thrombocytopenia, unspecified: Secondary | ICD-10-CM | POA: Diagnosis present

## 2021-03-19 DIAGNOSIS — R933 Abnormal findings on diagnostic imaging of other parts of digestive tract: Secondary | ICD-10-CM

## 2021-03-19 DIAGNOSIS — K449 Diaphragmatic hernia without obstruction or gangrene: Secondary | ICD-10-CM | POA: Diagnosis present

## 2021-03-19 LAB — CBC WITH DIFFERENTIAL/PLATELET
Abs Immature Granulocytes: 0.08 10*3/uL — ABNORMAL HIGH (ref 0.00–0.07)
Basophils Absolute: 0 10*3/uL (ref 0.0–0.1)
Basophils Relative: 0 %
Eosinophils Absolute: 0.1 10*3/uL (ref 0.0–0.5)
Eosinophils Relative: 1 %
HCT: 41.3 % (ref 36.0–46.0)
Hemoglobin: 13.3 g/dL (ref 12.0–15.0)
Immature Granulocytes: 1 %
Lymphocytes Relative: 4 %
Lymphs Abs: 0.6 10*3/uL — ABNORMAL LOW (ref 0.7–4.0)
MCH: 30.1 pg (ref 26.0–34.0)
MCHC: 32.2 g/dL (ref 30.0–36.0)
MCV: 93.4 fL (ref 80.0–100.0)
Monocytes Absolute: 0.8 10*3/uL (ref 0.1–1.0)
Monocytes Relative: 6 %
Neutro Abs: 11.2 10*3/uL — ABNORMAL HIGH (ref 1.7–7.7)
Neutrophils Relative %: 88 %
Platelets: 133 10*3/uL — ABNORMAL LOW (ref 150–400)
RBC: 4.42 MIL/uL (ref 3.87–5.11)
RDW: 14 % (ref 11.5–15.5)
WBC: 12.7 10*3/uL — ABNORMAL HIGH (ref 4.0–10.5)
nRBC: 0 % (ref 0.0–0.2)

## 2021-03-19 LAB — CBG MONITORING, ED: Glucose-Capillary: 92 mg/dL (ref 70–99)

## 2021-03-19 LAB — COMPREHENSIVE METABOLIC PANEL
ALT: 220 U/L — ABNORMAL HIGH (ref 0–44)
AST: 257 U/L — ABNORMAL HIGH (ref 15–41)
Albumin: 3.4 g/dL — ABNORMAL LOW (ref 3.5–5.0)
Alkaline Phosphatase: 122 U/L (ref 38–126)
Anion gap: 8 (ref 5–15)
BUN: 27 mg/dL — ABNORMAL HIGH (ref 8–23)
CO2: 24 mmol/L (ref 22–32)
Calcium: 8.4 mg/dL — ABNORMAL LOW (ref 8.9–10.3)
Chloride: 105 mmol/L (ref 98–111)
Creatinine, Ser: 0.99 mg/dL (ref 0.44–1.00)
GFR, Estimated: 50 mL/min — ABNORMAL LOW (ref >60)
Glucose, Bld: 120 mg/dL — ABNORMAL HIGH (ref 70–99)
Potassium: 4.7 mmol/L (ref 3.5–5.1)
Sodium: 137 mmol/L (ref 135–145)
Total Bilirubin: 1.5 mg/dL — ABNORMAL HIGH (ref 0.3–1.2)
Total Protein: 5.9 g/dL — ABNORMAL LOW (ref 6.5–8.1)

## 2021-03-19 LAB — URINALYSIS, ROUTINE W REFLEX MICROSCOPIC
Bilirubin Urine: NEGATIVE
Glucose, UA: NEGATIVE mg/dL
Ketones, ur: NEGATIVE mg/dL
Leukocytes,Ua: NEGATIVE
Nitrite: NEGATIVE
Protein, ur: NEGATIVE mg/dL
Specific Gravity, Urine: 1.013 (ref 1.005–1.030)
pH: 5 (ref 5.0–8.0)

## 2021-03-19 LAB — FOLATE: Folate: 18.1 ng/mL (ref >5.9)

## 2021-03-19 LAB — HEPATITIS PANEL, ACUTE
HCV Ab: NONREACTIVE
Hep A IgM: NONREACTIVE
Hep B C IgM: NONREACTIVE
Hepatitis B Surface Ag: NONREACTIVE

## 2021-03-19 LAB — VITAMIN B12: Vitamin B-12: 435 pg/mL (ref 180–914)

## 2021-03-19 LAB — T4, FREE: Free T4: 1.54 ng/dL — ABNORMAL HIGH (ref 0.61–1.12)

## 2021-03-19 LAB — RESP PANEL BY RT-PCR (FLU A&B, COVID) ARPGX2
Influenza A by PCR: NEGATIVE
Influenza B by PCR: NEGATIVE
SARS Coronavirus 2 by RT PCR: NEGATIVE

## 2021-03-19 LAB — TSH: TSH: 0.642 u[IU]/mL (ref 0.350–4.500)

## 2021-03-19 MED ORDER — ACETAMINOPHEN 325 MG PO TABS
650.0000 mg | ORAL_TABLET | Freq: Four times a day (QID) | ORAL | Status: DC | PRN
  Administered 2021-03-23: 650 mg via ORAL
  Filled 2021-03-19: qty 2

## 2021-03-19 MED ORDER — SODIUM CHLORIDE 0.9 % IV SOLN
2.0000 g | INTRAVENOUS | Status: DC
  Administered 2021-03-19 – 2021-03-23 (×4): 2 g via INTRAVENOUS
  Filled 2021-03-19 (×5): qty 20

## 2021-03-19 MED ORDER — IOHEXOL 300 MG/ML  SOLN
30.0000 mL | Freq: Once | INTRAMUSCULAR | Status: AC | PRN
  Administered 2021-03-19: 30 mL via INTRAVENOUS

## 2021-03-19 MED ORDER — ENOXAPARIN SODIUM 30 MG/0.3ML IJ SOSY: 30.0000 mg | PREFILLED_SYRINGE | INTRAMUSCULAR | Status: DC

## 2021-03-19 MED ORDER — ENOXAPARIN SODIUM 40 MG/0.4ML IJ SOSY
40.0000 mg | PREFILLED_SYRINGE | INTRAMUSCULAR | Status: DC
  Administered 2021-03-19: 40 mg via SUBCUTANEOUS
  Filled 2021-03-19: qty 0.4

## 2021-03-19 MED ORDER — ONDANSETRON HCL 4 MG PO TABS: 4.0000 mg | ORAL_TABLET | Freq: Four times a day (QID) | ORAL | Status: DC | PRN

## 2021-03-19 MED ORDER — LACTATED RINGERS IV BOLUS
500.0000 mL | Freq: Once | INTRAVENOUS | Status: AC
  Administered 2021-03-19: 500 mL via INTRAVENOUS

## 2021-03-19 MED ORDER — FLEET ENEMA 7-19 GM/118ML RE ENEM
1.0000 | ENEMA | Freq: Once | RECTAL | Status: AC
  Administered 2021-03-19: 1 via RECTAL

## 2021-03-19 MED ORDER — TAMSULOSIN HCL 0.4 MG PO CAPS
0.4000 mg | ORAL_CAPSULE | Freq: Every day | ORAL | Status: DC
  Administered 2021-03-19 – 2021-03-23 (×4): 0.4 mg via ORAL
  Filled 2021-03-19 (×5): qty 1

## 2021-03-19 MED ORDER — PIPERACILLIN-TAZOBACTAM 3.375 G IVPB 30 MIN
3.3750 g | Freq: Once | INTRAVENOUS | Status: AC
  Administered 2021-03-19: 3.375 g via INTRAVENOUS
  Filled 2021-03-19: qty 50

## 2021-03-19 MED ORDER — METRONIDAZOLE 500 MG/100ML IV SOLN
500.0000 mg | Freq: Three times a day (TID) | INTRAVENOUS | Status: DC
  Administered 2021-03-19 – 2021-03-23 (×12): 500 mg via INTRAVENOUS
  Filled 2021-03-19 (×13): qty 100

## 2021-03-19 MED ORDER — POLYETHYLENE GLYCOL 3350 17 G PO PACK
17.0000 g | PACK | Freq: Two times a day (BID) | ORAL | Status: DC
  Administered 2021-03-19 – 2021-03-23 (×6): 17 g via ORAL
  Filled 2021-03-19 (×9): qty 1

## 2021-03-19 MED ORDER — SENNOSIDES-DOCUSATE SODIUM 8.6-50 MG PO TABS
2.0000 | ORAL_TABLET | Freq: Two times a day (BID) | ORAL | Status: DC
  Administered 2021-03-19 – 2021-03-23 (×6): 2 via ORAL
  Filled 2021-03-19 (×9): qty 2

## 2021-03-19 MED ORDER — ONDANSETRON HCL 4 MG/2ML IJ SOLN
4.0000 mg | Freq: Four times a day (QID) | INTRAMUSCULAR | Status: DC | PRN
  Administered 2021-03-21: 4 mg via INTRAVENOUS
  Filled 2021-03-19 (×2): qty 2

## 2021-03-19 MED ORDER — ROPINIROLE HCL 1 MG PO TABS
2.0000 mg | ORAL_TABLET | Freq: Every day | ORAL | Status: DC
  Administered 2021-03-19 – 2021-03-22 (×4): 2 mg via ORAL
  Filled 2021-03-19 (×4): qty 2

## 2021-03-19 MED ORDER — PANTOPRAZOLE SODIUM 40 MG PO TBEC
40.0000 mg | DELAYED_RELEASE_TABLET | Freq: Two times a day (BID) | ORAL | Status: DC
  Administered 2021-03-19 – 2021-03-23 (×7): 40 mg via ORAL
  Filled 2021-03-19 (×9): qty 1

## 2021-03-19 MED ORDER — ACETAMINOPHEN 650 MG RE SUPP: 650.0000 mg | Freq: Four times a day (QID) | RECTAL | Status: DC | PRN

## 2021-03-19 MED ORDER — FAMOTIDINE IN NACL 20-0.9 MG/50ML-% IV SOLN
20.0000 mg | Freq: Two times a day (BID) | INTRAVENOUS | Status: DC
  Administered 2021-03-19: 20 mg via INTRAVENOUS
  Filled 2021-03-19: qty 50

## 2021-03-19 MED ORDER — LACTATED RINGERS IV SOLN: INTRAVENOUS | Status: DC

## 2021-03-19 MED ORDER — IOHEXOL 300 MG/ML  SOLN
50.0000 mL | Freq: Once | INTRAMUSCULAR | Status: AC | PRN
  Administered 2021-03-19: 50 mL via INTRAVENOUS

## 2021-03-19 MED ORDER — ISOSORBIDE MONONITRATE ER 30 MG PO TB24
15.0000 mg | ORAL_TABLET | Freq: Every day | ORAL | Status: DC
  Administered 2021-03-19 – 2021-03-23 (×4): 15 mg via ORAL
  Filled 2021-03-19 (×5): qty 1

## 2021-03-19 NOTE — Consult Note (Addendum)
_0 @   Referring Provider: [facility] hospitalist Primary Care Physician:  Sinda Du, MD Primary Gastroenterologist:  Dr. Gala Romney  Date of Admission: 03/18/21 Date of Consultation: 03/19/21  Reason for Consultation: Abnormal LFTs, biliary dilation on CT A/P, fever, abdominal pain  HPI:  Susan Glass is a 87 y.o. year old female with history of chronic diastolic heart failure, CAD, HTN, HLD GERD, ulcerative reflux esophagitis with stricture s/p dilation in 2016, large hiatal hernia, non-H. pylori gastritis, IDA who presented to the emergency room via EMS from [place] with reported hypoxia in the 60s and reports of postprandial abdominal pain with associated nausea for several days.  ED course:  - Hemodynamically stable.  Shortly after arrival, patient became febrile with temperature of 101.5 F.   - Labs remarkable for WBC 12.0 with 89% neutrophils, hemoglobin 15.5, platelets 154, electrolytes normal, creatinine 1.0, BUN 30, T bili 1.6, AST 428, ALT 229, alk phos 163. - UA with rare bacteria. - Lactic acid normal x2. - CT A/P with contrast showed diffuse intrahepatic and extrahepatic biliary ductal dilation s/p cholecystectomy, moderate sized hiatal hernia with mild thickening and edema of the wall of distal esophagus, dense stool in the rectal vault that may represent a degree of fecal impaction.  - She was given IV fluids and started on IV antibiotics.  Repeat labs this morning with WBC slightly increased to 12.7, AST 257, ALT 220, alk phos 122, T bili 1.5. RUQ ultrasound has been ordered.  Today:  States she came to the hospital due to mild postprandial abdominal pain x 2 weeks.  Denies associated nausea or vomiting. Symptoms last for a few minutes and resolve. Feels like an upset stomach, not sharp. Somewhat similar to symptoms prior to gallbladder being removed years ago. Denies GERD symptoms or dysphagia. Bms every 2-3 days at baseline with last BM 2 days ago. Denies brbpr or melena.    Denies SOB, CP, palpitations, urinary symptoms.   Past Medical History:  Diagnosis Date   CAD (coronary artery disease)    CHF (congestive heart failure) (HCC)    GERD (gastroesophageal reflux disease)    GI bleed    UGI   Hiatal hernia    large, sliding    Hyperlipidemia    hypercholesterolemia   Hypertension    IDA (iron deficiency anemia)     Past Surgical History:  Procedure Laterality Date   APPENDECTOMY     CHOLECYSTECTOMY     COLONOSCOPY  2008   Dr. Gala Romney: Hyperplastic polyp removed, oozing erosion at the ileocecal valve with benign pathology, likely NSAID related.   ESOPHAGEAL DILATION N/A 11/28/2014   Procedure: ESOPHAGEAL DILATION;  Surgeon: Danie Binder, MD;  Location: [facility];  Service: Endoscopy;  Laterality: N/A;   ESOPHAGOGASTRODUODENOSCOPY N/A 11/28/2014   Dr.Fields- UGI bleed d/t severe distal erosive esophagitis in the setting of GE JXN stricture, large sliding hiatal hernia, mild non-erosive gastritis    Prior to Admission medications   Medication Sig Start Date End Date Taking? Authorizing Provider  acetaminophen (TYLENOL) 500 MG tablet Take 1,000 mg by mouth 2 (two) times daily.     [provider]  amLODipine (NORVASC) 2.5 MG tablet TAKE 1 TABLET BY MOUTH ONCE DAILY Patient taking differently: Take 2.5 mg by mouth daily.  10/17/17   Herminio Commons, MD  aspirin 81 MG chewable tablet Chew 81 mg by mouth daily.     [provider]  famotidine (PEPCID) 20 MG tablet Take 20 mg by mouth daily.  [provider]  ferrous sulfate (FERROUSUL) 325 (65 FE) MG tablet Take 1 tablet (325 mg total) by mouth daily with breakfast. Patient taking differently: Take 325 mg by mouth 2 (two) times daily.  11/29/14   Sinda Du, MD  furosemide (LASIX) 80 MG tablet TAKE ONE-HALF TABLET BY MOUTH ONCE DAILY Patient taking differently: Take 40 mg by mouth daily. TAKE ONE-HALF TABLET BY MOUTH ONCE DAILY 08/21/17   Herminio Commons, MD   isosorbide mononitrate (IMDUR) 30 MG 24 hr tablet Take 0.5 tablets (15 mg total) by mouth daily. 01/01/18   Sinda Du, MD  loperamide (IMODIUM) 2 MG capsule Take 2 mg by mouth every 8 (eight) hours as needed for diarrhea or loose stools.     [provider]  meclizine (ANTIVERT) 12.5 MG tablet Take 1 tablet (12.5 mg total) by mouth 3 (three) times daily as needed for dizziness. 01/01/18   Sinda Du, MD  Melatonin 5 MG TABS Take 1 tablet by mouth at bedtime.    [provider]  methylcellulose (CITRUCEL) oral powder Take 1 packet by mouth daily.    [provider]  Multiple Minerals-Vitamins (CITRACAL PLUS PO) Take 1 tablet by mouth daily.    [provider]  nitroGLYCERIN (NITROSTAT) 0.4 MG SL tablet Place 1 tablet (0.4 mg total) under the tongue every 5 (five) minutes as needed for chest pain. 01/01/18   Sinda Du, MD  Omega-3 Fatty Acids (FISH OIL) 1000 MG CAPS Take 1 capsule by mouth daily.      [provider]  Probiotic Product (PROBIOTIC PO) Take 2 capsules by mouth 2 (two) times daily.     [provider]    Current Facility-Administered Medications  Medication Dose Route Frequency Provider Last Rate Last Admin   acetaminophen (TYLENOL) tablet 650 mg  650 mg Oral Q6H PRN Reubin Milan, MD       Or   acetaminophen (TYLENOL) suppository 650 mg  650 mg Rectal Q6H PRN Reubin Milan, MD       cefTRIAXone (ROCEPHIN) 2 g in sodium chloride 0.9 % 100 mL IVPB  2 g Intravenous Q24H Reubin Milan, MD       enoxaparin (LOVENOX) injection 40 mg  40 mg Subcutaneous Q24H Reubin Milan, MD       famotidine (PEPCID) IVPB 20 mg premix  20 mg Intravenous Q12H Reubin Milan, MD   Stopped at 03/19/21 9735   lactated ringers infusion   Intravenous Continuous Reubin Milan, MD 50 mL/hr at 03/19/21 0217 New Bag at 03/19/21 0217   metroNIDAZOLE (FLAGYL) IVPB 500 mg  500 mg Intravenous Q8H Reubin Milan,  MD       ondansetron [facility]) tablet 4 mg  4 mg Oral Q6H PRN Reubin Milan, MD       Or   ondansetron [facility]) injection 4 mg  4 mg Intravenous Q6H PRN Reubin Milan, MD       Current Outpatient Medications  Medication Sig Dispense Refill   acetaminophen (TYLENOL) 500 MG tablet Take 1,000 mg by mouth 2 (two) times daily.      amLODipine (NORVASC) 2.5 MG tablet TAKE 1 TABLET BY MOUTH ONCE DAILY (Patient taking differently: Take 2.5 mg by mouth daily. ) 90 tablet 3   aspirin 81 MG chewable tablet Chew 81 mg by mouth daily.      famotidine (PEPCID) 20 MG tablet Take 20 mg by mouth daily.     ferrous sulfate (FERROUSUL)  325 (65 FE) MG tablet Take 1 tablet (325 mg total) by mouth daily with breakfast. (Patient taking differently: Take 325 mg by mouth 2 (two) times daily. ) 30 tablet 3   furosemide (LASIX) 80 MG tablet TAKE ONE-HALF TABLET BY MOUTH ONCE DAILY (Patient taking differently: Take 40 mg by mouth daily. TAKE ONE-HALF TABLET BY MOUTH ONCE DAILY) 45 tablet 4   isosorbide mononitrate (IMDUR) 30 MG 24 hr tablet Take 0.5 tablets (15 mg total) by mouth daily.     loperamide (IMODIUM) 2 MG capsule Take 2 mg by mouth every 8 (eight) hours as needed for diarrhea or loose stools.      meclizine (ANTIVERT) 12.5 MG tablet Take 1 tablet (12.5 mg total) by mouth 3 (three) times daily as needed for dizziness. 30 tablet 0   Melatonin 5 MG TABS Take 1 tablet by mouth at bedtime.     methylcellulose (CITRUCEL) oral powder Take 1 packet by mouth daily.     Multiple Minerals-Vitamins (CITRACAL PLUS PO) Take 1 tablet by mouth daily.     nitroGLYCERIN (NITROSTAT) 0.4 MG SL tablet Place 1 tablet (0.4 mg total) under the tongue every 5 (five) minutes as needed for chest pain.  12   Omega-3 Fatty Acids (FISH OIL) 1000 MG CAPS Take 1 capsule by mouth daily.       Probiotic Product (PROBIOTIC PO) Take 2 capsules by mouth 2 (two) times daily.       Allergies as of 03/18/2021   (No Known Allergies)     Family History  Problem Relation Age of Onset   Hypertension Mother     Social History   Socioeconomic History   Marital status: Married    Spouse name: Not on file   Number of children: Not on file   Years of education: Not on file   Highest education level: Not on file  Occupational History   Not on file  Tobacco Use   Smoking status: Never   Smokeless tobacco: Never   Tobacco comments:    Never smoked  Vaping Use   Vaping Use: Never used  Substance and Sexual Activity   Alcohol use: No    Alcohol/week: 0.0 standard drinks   Drug use: No   Sexual activity: Not on file  Other Topics Concern   Not on file  Social History Narrative   Not on file   Social Determinants of Health   Financial Resource Strain: Not on file  Food Insecurity: Not on file  Transportation Needs: Not on file  Physical Activity: Not on file  Stress: Not on file  Social Connections: Not on file  Intimate Partner Violence: Not on file    Review of Systems: Gen: Denies fever, chills, cold or flulike symptoms. CV: Denies chest pain, heart palpitations Resp: Denies shortness of breath or cough GI: See HPI GU : Denies urinary burning, urinary frequency, urinary incontinence.  MS: Denies joint pain Derm: Denies rash Psych: Denies depression, anxiety Heme: See HPI  Physical Exam: Vital signs in last 24 hours: Temp:  [97.9 F (36.6 C)-101.5 F (38.6 C)] 97.9 F (36.6 C) (08/05 0142) Pulse Rate:  [48-96] 59 (08/05 0600) Resp:  [18-30] 24 (08/05 0600) BP: (99-162)/(51-80) 123/57 (08/05 0600) SpO2:  [91 %-100 %] 95 % (08/05 0600)   General:   Alert,  Well-developed, well-nourished, pleasant and cooperative in NAD, elderly.  Head:  Normocephalic and atraumatic. Eyes:  Sclera clear, no icterus.   Conjunctiva pink. Ears:  Normal auditory acuity.  Lungs:  Diffuse coarse and breath sounds/mild crackles. No wheezes or rhonchi.  No acute distress. Heart:  Regular rate and rhythm; no  murmurs, clicks, rubs,  or gallops. Abdomen:  Soft and nondistended.  Very mild TTP in the epigastric/RUQ region with deep palpation.  No masses, hepatosplenomegaly or hernias noted. Normal bowel sounds, without guarding, and without rebound.   Rectal:  External hemorrhoid tissue. Rectal vault with soft brown stool. Small amount of stool removed.  Msk:  Symmetrical without gross deformities. Normal posture. Extremities:  Without edema. Neurologic:  Alert and  oriented x4;  grossly normal neurologically. Skin:  Intact without significant lesions or rashes. Psych:  Normal mood and affect.  Intake/Output from previous day: 08/04 0701 - 08/05 0700 In: 1088.8 [IV Piggyback:1088.8] Out: -  Intake/Output this shift: No intake/output data recorded.  Lab Results: Recent Labs    03/18/21 1954 03/19/21 0357  WBC 12.0* 12.7*  HGB 15.5* 13.3  HCT 48.2* 41.3  PLT 154 133*   BMET Recent Labs    03/18/21 1954 03/19/21 0357  NA 136 137  K 5.0 4.7  CL 101 105  CO2 26 24  GLUCOSE 119* 120*  BUN 30* 27*  CREATININE 1.08* 0.99  CALCIUM 9.0 8.4*   LFT Recent Labs    03/18/21 1954 03/19/21 0357  PROT 7.6 5.9*  ALBUMIN 4.5 3.4*  AST 428* 257*  ALT 229* 220*  ALKPHOS 163* 122  BILITOT 1.6* 1.5*    Studies/Results: CT ABDOMEN PELVIS W CONTRAST  Result Date: 03/19/2021 CLINICAL DATA:  Abdominal abscess/infection. EXAM: CT ABDOMEN AND PELVIS WITH CONTRAST TECHNIQUE: Multidetector CT imaging of the abdomen and pelvis was performed using the standard protocol following bolus administration of intravenous contrast. CONTRAST:  5m OMNIPAQUE IOHEXOL 300 MG/ML SOLN, 369mOMNIPAQUE IOHEXOL 300 MG/ML SOLN COMPARISON:  None. FINDINGS: Lower chest: Diffuse interstitial coarsening. There is cardiomegaly. Coronary vascular calcification. No intra-abdominal free air or free fluid. Hepatobiliary: The liver is unremarkable. There is diffuse intrahepatic and extrahepatic biliary ductal dilatation. The  common bile duct measures approximately 13 mm in diameter. No calcified stone noted in the central CBD. Cholecystectomy. Pancreas: No active inflammatory changes. Spleen: Normal in size without focal abnormality. Adrenals/Urinary Tract: The left adrenal glands unremarkable. There is a 2 cm indeterminate right adrenal nodule versus adrenal thickening. There is no hydronephrosis on either side. There is symmetric enhancement and excretion of contrast by both kidneys. The visualized ureters and urinary bladder appear unremarkable. Stomach/Bowel: There is a moderate size hiatal hernia. Apparent mild thickening and edema of the wall of the distal esophagus and herniated stomach. There is moderate stool throughout the colon. There is sigmoid diverticulosis without active inflammatory changes. Dense stool within the rectal vault may represent a degree of fecal impaction. There is no bowel obstruction. The appendix is not visualized with certainty. No inflammatory changes identified in the right lower quadrant. Vascular/Lymphatic: Advanced aortoiliac atherosclerotic disease. The IVC is unremarkable. No portal venous gas. There is no adenopathy. Reproductive: The uterus is anteverted and grossly unremarkable. Small calcified fibroid. No adnexal masses. Other: None Musculoskeletal: Osteopenia with degenerative changes of the spine. Old left pubic bone fractures with nonunion. No acute fracture. IMPRESSION: 1. Moderate size hiatal hernia with findings of esophagitis. 2. Sigmoid diverticulosis. No bowel obstruction. 3. Dense stool within the rectal vault may represent a degree of fecal impaction. 4. Diffuse intrahepatic and extrahepatic biliary ductal dilatation, status post prior cholecystectomy. No retained calcified stone noted in the central CBD. Correlation with liver function  tests recommended. 5. Aortic Atherosclerosis (ICD10-I70.0). Electronically Signed   By: Anner Crete M.D.   On: 03/19/2021 01:52   DG Chest  Port 1 View  Result Date: 03/18/2021 CLINICAL DATA:  Fever EXAM: PORTABLE CHEST 1 VIEW COMPARISON:  12/27/2017 FINDINGS: Lung volumes are small, but appears symmetric. Chronic interstitial thickening again noted. No superimposed confluent pulmonary infiltrate. No pneumothorax or pleural effusion. Cardiac size is mildly enlarged, stable. Pulmonary vasculature is normal. Advanced degenerative changes are noted within the shoulders bilaterally. No acute bone abnormality. IMPRESSION: Pulmonary hypoinflation.  Stable cardiomegaly. Electronically Signed   By: Fidela Salisbury MD   On: 03/18/2021 20:48    Impression: 87 y.o. year old female with history of chronic diastolic heart failure, CAD, HTN, HLD GERD, ulcerative reflux esophagitis with stricture s/p dilation in 2016, large hiatal hernia, non-H. pylori gastritis, IDA who presented to the emergency room via EMS from [place] with reported hypoxia in the 60s (now resolved and on room air) and reports of postprandial abdominal pain with associated nausea x2 weeks.  She is found be febrile in the ED with temp of 101.5 F, WBC mildly elevated at 12.0, T bili 1.6, AST 428, ALT 229, alk phos 163 (LFTs previously normal).  CT A/P with contrast with diffuse intrahepatic and extrahepatic ductal dilation s/p cholecystectomy, moderate size hiatal hernia with mild thickening and edema of the wall of distal esophagus, did not stool in the rectal vault that may represent degree of fecal impaction.  Dilated biliary tree: Etiology unclear, cannot rule out biliary stones versus stricture versus malignancy.  Follow-up ultrasound this morning with CBD dilated at 1.3 cm and dilated intrahepatic bile ducts.  She is currently on ceftriaxone and Flagyl as well as IV fluids.  LFTs mildly improved today with AST 257, ALT 220, alk phos 122, T bili 1.5.  WBC elevated at 12.7.  Clinically, patient denies abdominal pain today.  On exam, she has very mild TTP in the RUQ and epigastric area to  deep palpation.  She is afebrile today and is alert and oriented x4.  She will need ERCP for further evaluation and intervention.  This was discussed with patient today and she is agreeable if we feel it is necessary.  Esophageal wall thickening: Noted on CT.  Likely secondary to reflux esophagitis as she has history of the same.  Could also have pill induced esophagitis. Less likely malignancy.  Appears she is on Pepcid outpatient rather than PPI.  She denies typical reflux symptoms or dysphagia. This can be evaluated at the time of ERCP. Recommend PPI BID.   Stool in the rectal vault noted on CT: Chronic history of mild constipation with BMs every 2 to 3 days.  Rectal exam today with soft stool in the rectal vault with small amount of stool removed.  Patient likely needs to be on a daily bowel regimen.  Can continue current regimen started by hospitalist today including MiraLAX twice daily and senna twice daily.   Plan: Clear liquid diet today as tolerated. N.p.o. at midnight. Plan for ERCP with Dr. Laural Golden tomorrow. The risks, benefits, and alternatives have been discussed with the patient in detail. The patient states understanding and desires to proceed.   Tomorrow mornings Lovenox will need to be held. Continue antibiotics. Start PPI twice daily. Agree with MiraLAX twice daily and senna twice daily.     LOS: 0 days    03/19/2021, 7:18 AM   Aliene Altes, [facility]

## 2021-03-19 NOTE — ED Notes (Signed)
Patient transported to CT 

## 2021-03-19 NOTE — ED Notes (Signed)
Update given to pelican. Facility does know that pt is going to be admitted.

## 2021-03-19 NOTE — ED Notes (Signed)
Patient found to have massive bowel movement. Patient and all linens soiled with liquidty type stool. Patient cleaned and bed changed.

## 2021-03-19 NOTE — Anesthesia Preprocedure Evaluation (Addendum)
Anesthesia Evaluation  Patient identified by MRN, date of birth, ID band Patient awake    Reviewed: Allergy & Precautions, NPO status , Patient's Chart, lab work & pertinent test results  History of Anesthesia Complications Negative for: history of anesthetic complications  Airway Mallampati: III  TM Distance: >3 FB Neck ROM: Full    Dental  (+) Dental Advisory Given, Missing   Pulmonary neg pulmonary ROS,   Mild exp wheezing  breath sounds clear to auscultation + wheezing      Cardiovascular hypertension, Pt. on medications + angina + CAD and +CHF  + dysrhythmias Atrial Fibrillation  Rhythm:Irregular Rate:Bradycardia  18-Mar-2021 19:11:36 [facility] ROUTINE RECORD 1917-06-29 (89 yr) Female Caucasian Room:ER6 Loc:499 Technician: Test ind: Comment 1:: Comment 2:: Comment 3:: Comment 4:: Vent. rate 89 BPM PR interval * ms QRS duration 133 ms QT/QTcB 397/478 ms P-R-T axes * 253 69 Atrial fibrillation Nonspecific IVCD with LAD Consider anterior infarct Baseline wander in lead(s) V3 Since last tracing now in Atrial fibrillation with Non-specific intra-ventricular conduction delay   Neuro/Psych negative neurological ROS  negative psych ROS   GI/Hepatic hiatal hernia, GERD  Medicated,(+) Hepatitis - (dilated biliary tract, cholangitis)  Endo/Other  negative endocrine ROS  Renal/GU Renal InsufficiencyRenal disease     Musculoskeletal negative musculoskeletal ROS (+)   Abdominal   Peds  Hematology  (+) anemia ,   Anesthesia Other Findings Patient c/o SOB this morning and rales in lung bases as per Dr.Rahman, procedure cancelled and rescheduled after optimizing patient's pulmonary status, will check her ECHO.  03/22/21: PT ECHO reviewed, resp status appears improved from the weekend  Reproductive/Obstetrics negative OB ROS                         Anesthesia  Physical Anesthesia Plan  ASA: 4  Anesthesia Plan: General   Post-op Pain Management:    Induction: Intravenous  PONV Risk Score and Plan: Ondansetron  Airway Management Planned: Oral ETT  Additional Equipment:   Intra-op Plan:   Post-operative Plan: Extubation in OR  Informed Consent: I have reviewed the patients History and Physical, chart, labs and discussed the procedure including the risks, benefits and alternatives for the proposed anesthesia with the patient or authorized representative who has indicated his/her understanding and acceptance.    Continue DNR and Discussed DNR with power of attorney.   Dental advisory given  Plan Discussed with: CRNA  Anesthesia Plan Comments: (Preop duoneb treatment)      Anesthesia Quick Evaluation

## 2021-03-19 NOTE — ED Provider Notes (Signed)
  Provider Note MRN:  [id number]  Arrival date & time: 03/19/21    ED Course and Medical Decision Making  Assumed care from Dr. Eulis Foster at shift change.  Fever, elevated LFTs, awaiting CT, anticipating admission.  CT revealing some biliary ductal dilation, and so considering this and the elevated LFTs there is some concern for possible cholangitis.  Providing Zosyn, admitted to medicine for further care.  .Critical Care  Date/Time: 03/19/2021 7:33 AM Performed by: Maudie Flakes, MD Authorized by: Maudie Flakes, MD   Critical care provider statement:    Critical care time (minutes):  32   Critical care was necessary to treat or prevent imminent or life-threatening deterioration of the following conditions: Concern for sepsis, cholangitis.   Critical care was time spent personally by me on the following activities:  Discussions with consultants, evaluation of patient's response to treatment, examination of patient, ordering and performing treatments and interventions, ordering and review of laboratory studies, ordering and review of radiographic studies, pulse oximetry, re-evaluation of patient's condition, obtaining history from patient or surrogate and review of old charts   I assumed direction of critical care for this patient from another provider in my specialty: yes     Care discussed with: admitting provider    Final Clinical Impressions(s) / ED Diagnoses     ICD-10-CM   1. Febrile illness  R50.9     2. Transaminitis  R74.01     3. Abnormal LFTs  R94.5 US Abdomen Limited RUQ (LIVER/GB)    US Abdomen Limited RUQ (LIVER/GB)      ED Discharge Orders     None       Discharge Instructions   None     Barth Kirks. [place] Small, [place] [email or web address]    Maudie Flakes, MD 03/19/21 [phone]

## 2021-03-19 NOTE — Evaluation (Signed)
Physical Therapy Evaluation Patient Details Name: Susan Glass MRN: [id number] DOB: 1917-03-28 Today's Date: 03/19/2021   History of Present Illness  Susan Glass is a 87 y.o. female with medical history significant of CAD, chronic diastolic heart failure, GERD, history of upper GI bleed, hiatal hernia, hyperlipidemia, hypertension, iron deficiency anemia who is sent from her facility [facility]) due to hypoxia in the 60s, the patient also stated she has been having postprandial pain associated with nausea for several days.  She has been constipated.  No melena or hematochezia.  No dysuria, frequency or hematuria.  She became febrile shortly after arrival to the emergency department.  She denied fever, chills, sore throat, rhinorrhea, dyspnea, chest pain, palpitations, diaphoresis, PND, orthopnea or recent pitting edema of the lower extremities.  Denied polyuria, polydipsia, polyphagia or blurred vision.   Clinical Impression   Patient demonstrates overall performance consistent with PLOF performing bed mobility with CGA-supervision in supine to sit and sit to supine with slight steadying support to achieve upright position at EOB although this was likely only required due to height of bed in ED.  Sit to stand performed with CGA for steadying support, again due to limitation of height of bed in ED and required HHA which pt reports is how she is assisted at SNF/LTC and transfers with CGA from EOB to arm chair at bedside. Pt able to ambulate with slow, steady pattern using RW. Pt reports she primarily uses a manual w/c for mobility in her day-to-day routine in LTC where she resides.  Patient demonstrates performance consistent with PLOF and does not require PT services at this time and patient discharged to care of nursing for transfers daily as tolerated for length of stay.     Follow Up Recommendations SNF/LTC    Equipment Recommendations       Recommendations for Other Services        Precautions / Restrictions        Mobility  Bed Mobility Overal bed mobility: Needs Assistance Bed Mobility: Supine to Sit;Sit to Supine     Supine to sit: Min guard Sit to supine: Min guard        Transfers Overall transfer level: Needs assistance Equipment used: 1 person hand held assist Transfers: Sit to/from [facility] Sit to Stand: Min guard Stand pivot transfers: Min guard       General transfer comment: pt reports,"this is how I do at the nursing home" Indicating HHA  Ambulation/Gait Ambulation/Gait assistance: Min guard Gait Distance (Feet): 15 Feet Assistive device: Rolling walker (2 wheeled) Gait Pattern/deviations: Step-to pattern Gait velocity: decreased      Stairs            Wheelchair Mobility    Modified Rankin (Stroke Patients Only)       Balance Overall balance assessment: Mild deficits observed, not formally tested                                           Pertinent Vitals/Pain Pain Assessment: 0-10 Pain Score: 0-No pain    Home Living Family/patient expects to be discharged to:: Skilled nursing facility                      Prior Function Level of Independence: Needs assistance   Gait / Transfers Assistance Needed: Pt requiring assistance with mobility and ADL at PLOF/baseline  ADL's /  Homemaking Assistance Needed: assist with ADL and mobility        Hand Dominance        Extremity/Trunk Assessment        Lower Extremity Assessment Lower Extremity Assessment: Overall WFL for tasks assessed       Communication      Cognition Arousal/Alertness: Awake/alert Behavior During Therapy: WFL for tasks assessed/performed                                          General Comments      Exercises     Assessment/Plan Patient demonstrates CLOF consistent with PLOF performing mobility with caregiver assistance at one-assist with HHA   PT Assessment  Patent does not need any further PT services;All further PT needs can be met in the next venue of care  PT Problem List Other (comment) (Pt performing at level consistent with PLOF)       PT Treatment Interventions      PT Goals (Current goals can be found in the Care Plan section)  Acute Rehab PT Goals Patient Stated Goal: Return to LTC PT Goal Formulation: With patient Time For Goal Achievement: 03/19/21 Potential to Achieve Goals: Good    Frequency     Barriers to discharge        Co-evaluation               AM-PAC PT "6 Clicks" Mobility  Outcome Measure Help needed turning from your back to your side while in a flat bed without using bedrails?: A Little Help needed moving from lying on your back to sitting on the side of a flat bed without using bedrails?: A Little Help needed moving to and from a bed to a chair (including a wheelchair)?: A Little Help needed standing up from a chair using your arms (e.g., wheelchair or bedside chair)?: A Little Help needed to walk in hospital room?: A Little Help needed climbing 3-5 steps with a railing? : A Lot 6 Click Score: 17    End of Session Equipment Utilized During Treatment: Gait belt Activity Tolerance: Patient tolerated treatment well Patient left: in bed;with call bell/phone within reach Nurse Communication: Mobility status PT Visit Diagnosis: Unsteadiness on feet (R26.81);Difficulty in walking, not elsewhere classified (R26.2)    Time: 1425-1450 PT Time Calculation (min) (ACUTE ONLY): 25 min   Charges:   PT Evaluation $PT Eval Low Complexity: 1 Low PT Treatments $Therapeutic Activity: 8-22 mins        3:08 PM, 03/19/21 M. Sherlyn Lees, PT, DPT Physical Therapist- [place] Office Number: [phone]

## 2021-03-19 NOTE — H&P (Signed)
History and Physical    Susan Glass [id number] DOB: 12-10-1916 DOA: 03/18/2021  PCP: Sinda Du, MD  Patient coming from: Home.  I have personally briefly reviewed patient's old medical records in [place]  Chief Complaint: Hypoxia.  HPI: Susan Glass is a 87 y.o. female with medical history significant of CAD, chronic diastolic heart failure, GERD, history of upper GI bleed, hiatal hernia, hyperlipidemia, hypertension, iron deficiency anemia who is sent from her facility [facility]) due to hypoxia in the 60s, the patient also stated she has been having postprandial pain associated with nausea for several days.  She has been constipated.  No melena or hematochezia.  No dysuria, frequency or hematuria.  She became febrile shortly after arrival to the emergency department.  She denied fever, chills, sore throat, rhinorrhea, dyspnea, chest pain, palpitations, diaphoresis, PND, orthopnea or recent pitting edema of the lower extremities.  Denied polyuria, polydipsia, polyphagia or blurred vision.  ED Course: Initial vital signs were temperature 99.1 F, pulse 84, respirations 20, BP 158/70 mmHg O2 sat 95% on room air.  Shortly after arrival, the patient was febrile with a temperature of 101.5 F.  She received 500 mL of NS bolus and a dose of Zosyn 3.375 g IVPB.  Lab work: Her urinalysis shows small hemoglobinuria and rare bacteria on microscopic examination.  Lactic acid was normal x2.  CBC showed a white count of 12.0 with 89% neutrophils, hemoglobin 15.5 g/dL and platelets 154.  Venous blood gas with slightly decreased PCO2 and slightly increased PO2 but otherwise it was unremarkable.  CMP showed normal electrolytes.  Glucose 119, BUN 30, creatinine 1.0 a and total bilirubin 1.6 mg/dL.  She has normal total protein and albumin.  AST was 428, ALT 229 and alkaline phosphatase 163 units/L.  Imaging: CT abdomen/pelvis with contrast showed diffuse intrahepatic and extrahepatic biliary  ductal dilation she is status to post prior cholecystectomy.  She also had a moderate sized hiatal hernia with findings of esophagitis, sigmoid diverticulosis and aortic atherosclerosis.  She has dense stool in the rectal vault which could represent fecal impaction.  Please see images and full radiology report for further detail.  Review of Systems: As per HPI otherwise all other systems reviewed and are negative.  Past Medical History:  Diagnosis Date   CAD (coronary artery disease)    CHF (congestive heart failure) (HCC)    GERD (gastroesophageal reflux disease)    GI bleed    UGI   Hiatal hernia    large, sliding    Hyperlipidemia    hypercholesterolemia   Hypertension    IDA (iron deficiency anemia)    Past Surgical History:  Procedure Laterality Date   APPENDECTOMY     CHOLECYSTECTOMY     COLONOSCOPY  2008   Dr. Gala Romney: Hyperplastic polyp removed, oozing erosion at the ileocecal valve with benign pathology, likely NSAID related.   ESOPHAGEAL DILATION N/A 11/28/2014   Procedure: ESOPHAGEAL DILATION;  Surgeon: Danie Binder, MD;  Location: [facility];  Service: Endoscopy;  Laterality: N/A;   ESOPHAGOGASTRODUODENOSCOPY N/A 11/28/2014   Dr.Fields- UGI bleed d/t severe distal erosive esophagitis in the setting of GE JXN stricture, large sliding hiatal hernia, mild non-erosive gastritis   Social History  reports that she has never smoked. She has never used smokeless tobacco. She reports that she does not drink alcohol and does not use drugs.  No Known Allergies  Family History  Problem Relation Age of Onset   Hypertension Mother  Prior to Admission medications   Medication Sig Start Date End Date Taking? Authorizing Provider  acetaminophen (TYLENOL) 500 MG tablet Take 1,000 mg by mouth 2 (two) times daily.     [provider]  amLODipine (NORVASC) 2.5 MG tablet TAKE 1 TABLET BY MOUTH ONCE DAILY Patient taking differently: Take 2.5 mg by mouth daily.  10/17/17    Herminio Commons, MD  aspirin 81 MG chewable tablet Chew 81 mg by mouth daily.     [provider]  famotidine (PEPCID) 20 MG tablet Take 20 mg by mouth daily.    [provider]  ferrous sulfate (FERROUSUL) 325 (65 FE) MG tablet Take 1 tablet (325 mg total) by mouth daily with breakfast. Patient taking differently: Take 325 mg by mouth 2 (two) times daily.  11/29/14   Sinda Du, MD  furosemide (LASIX) 80 MG tablet TAKE ONE-HALF TABLET BY MOUTH ONCE DAILY Patient taking differently: Take 40 mg by mouth daily. TAKE ONE-HALF TABLET BY MOUTH ONCE DAILY 08/21/17   Herminio Commons, MD  isosorbide mononitrate (IMDUR) 30 MG 24 hr tablet Take 0.5 tablets (15 mg total) by mouth daily. 01/01/18   Sinda Du, MD  loperamide (IMODIUM) 2 MG capsule Take 2 mg by mouth every 8 (eight) hours as needed for diarrhea or loose stools.     [provider]  meclizine (ANTIVERT) 12.5 MG tablet Take 1 tablet (12.5 mg total) by mouth 3 (three) times daily as needed for dizziness. 01/01/18   Sinda Du, MD  Melatonin 5 MG TABS Take 1 tablet by mouth at bedtime.    [provider]  methylcellulose (CITRUCEL) oral powder Take 1 packet by mouth daily.    [provider]  Multiple Minerals-Vitamins (CITRACAL PLUS PO) Take 1 tablet by mouth daily.    [provider]  nitroGLYCERIN (NITROSTAT) 0.4 MG SL tablet Place 1 tablet (0.4 mg total) under the tongue every 5 (five) minutes as needed for chest pain. 01/01/18   Sinda Du, MD  Omega-3 Fatty Acids (FISH OIL) 1000 MG CAPS Take 1 capsule by mouth daily.      [provider]  Probiotic Product (PROBIOTIC PO) Take 2 capsules by mouth 2 (two) times daily.     [provider]    Physical Exam: Vitals:   03/19/21 0140 03/19/21 0142 03/19/21 0142 03/19/21 0200  BP:    (!) 125/52  Pulse: (!) 48 65  67  Resp: (!) 22 (!) 25  (!) 21  Temp:   97.9 F (36.6 C)   TempSrc:   Oral   SpO2:  98% 96%  95%  Height:        Constitutional: NAD, calm, comfortable Eyes: PERRL, lids and conjunctivae normal ENMT: Mucous membranes are mildly dry.  Posterior pharynx clear of any exudate or lesions. Neck: normal, supple, no masses, no thyromegaly Respiratory: clear to auscultation bilaterally, no wheezing, no crackles. Normal respiratory effort. No accessory muscle use.  Cardiovascular: Regular rate and rhythm, no murmurs / rubs / gallops. No extremity edema. 2+ pedal pulses. No carotid bruits.  Abdomen: No distention.  Bowel sounds positive.  Soft, no tenderness on palpation, no masses palpated. No hepatosplenomegaly. Musculoskeletal: Mild generalized weakness.  No clubbing / cyanosis.  Good ROM, no contractures. Normal muscle tone.  Skin: no acute rashes, lesions, ulcers on very limited dermatological evaluation. Neurologic: CN 2-12 grossly intact. Sensation intact, DTR normal. Strength 5/5 in all 4.  Psychiatric: Normal judgment and insight. Alert and oriented x  3. Normal mood.   Labs on Admission: I have personally reviewed following labs and imaging studies  CBC: Recent Labs  Lab 03/18/21 1954  WBC 12.0*  NEUTROABS 10.6*  HGB 15.5*  HCT 48.2*  MCV 94.7  PLT [id number]    Basic Metabolic Panel: Recent Labs  Lab 03/18/21 1954  NA 136  K 5.0  CL 101  CO2 26  GLUCOSE 119*  BUN 30*  CREATININE 1.08*  CALCIUM 9.0    GFR: CrCl cannot be calculated (Unknown ideal weight.).  Liver Function Tests: Recent Labs  Lab 03/18/21 1954  AST 428*  ALT 229*  ALKPHOS 163*  BILITOT 1.6*  PROT 7.6  ALBUMIN 4.5    Urine analysis:    Component Value Date/Time   COLORURINE YELLOW 03/18/2021 1954   APPEARANCEUR CLEAR 03/18/2021 1954   LABSPEC 1.013 03/18/2021 1954   PHURINE 5.0 03/18/2021 1954   GLUCOSEU NEGATIVE 03/18/2021 1954   HGBUR SMALL (A) 03/18/2021 1954   BILIRUBINUR NEGATIVE 03/18/2021 [place] NEGATIVE 03/18/2021 1954   PROTEINUR NEGATIVE 03/18/2021 1954    NITRITE NEGATIVE 03/18/2021 1954   LEUKOCYTESUR NEGATIVE 03/18/2021 1954    Radiological Exams on Admission: CT ABDOMEN PELVIS W CONTRAST  Result Date: 03/19/2021 CLINICAL DATA:  Abdominal abscess/infection. EXAM: CT ABDOMEN AND PELVIS WITH CONTRAST TECHNIQUE: Multidetector CT imaging of the abdomen and pelvis was performed using the standard protocol following bolus administration of intravenous contrast. CONTRAST:  12m OMNIPAQUE IOHEXOL 300 MG/ML SOLN, 380mOMNIPAQUE IOHEXOL 300 MG/ML SOLN COMPARISON:  None. FINDINGS: Lower chest: Diffuse interstitial coarsening. There is cardiomegaly. Coronary vascular calcification. No intra-abdominal free air or free fluid. Hepatobiliary: The liver is unremarkable. There is diffuse intrahepatic and extrahepatic biliary ductal dilatation. The common bile duct measures approximately 13 mm in diameter. No calcified stone noted in the central CBD. Cholecystectomy. Pancreas: No active inflammatory changes. Spleen: Normal in size without focal abnormality. Adrenals/Urinary Tract: The left adrenal glands unremarkable. There is a 2 cm indeterminate right adrenal nodule versus adrenal thickening. There is no hydronephrosis on either side. There is symmetric enhancement and excretion of contrast by both kidneys. The visualized ureters and urinary bladder appear unremarkable. Stomach/Bowel: There is a moderate size hiatal hernia. Apparent mild thickening and edema of the wall of the distal esophagus and herniated stomach. There is moderate stool throughout the colon. There is sigmoid diverticulosis without active inflammatory changes. Dense stool within the rectal vault may represent a degree of fecal impaction. There is no bowel obstruction. The appendix is not visualized with certainty. No inflammatory changes identified in the right lower quadrant. Vascular/Lymphatic: Advanced aortoiliac atherosclerotic disease. The IVC is unremarkable. No portal venous gas. There is no  adenopathy. Reproductive: The uterus is anteverted and grossly unremarkable. Small calcified fibroid. No adnexal masses. Other: None Musculoskeletal: Osteopenia with degenerative changes of the spine. Old left pubic bone fractures with nonunion. No acute fracture. IMPRESSION: 1. Moderate size hiatal hernia with findings of esophagitis. 2. Sigmoid diverticulosis. No bowel obstruction. 3. Dense stool within the rectal vault may represent a degree of fecal impaction. 4. Diffuse intrahepatic and extrahepatic biliary ductal dilatation, status post prior cholecystectomy. No retained calcified stone noted in the central CBD. Correlation with liver function tests recommended. 5. Aortic Atherosclerosis (ICD10-I70.0). Electronically Signed   By: ArAnner Crete.D.   On: 03/19/2021 01:52   DG Chest Port 1 View  Result Date: 03/18/2021 CLINICAL DATA:  Fever EXAM: PORTABLE CHEST 1 VIEW COMPARISON:  12/27/2017 FINDINGS: Lung volumes are small, but  appears symmetric. Chronic interstitial thickening again noted. No superimposed confluent pulmonary infiltrate. No pneumothorax or pleural effusion. Cardiac size is mildly enlarged, stable. Pulmonary vasculature is normal. Advanced degenerative changes are noted within the shoulders bilaterally. No acute bone abnormality. IMPRESSION: Pulmonary hypoinflation.  Stable cardiomegaly. Electronically Signed   By: Fidela Salisbury MD   On: 03/18/2021 20:48    EKG: Independently reviewed.  Vent. rate 89 BPM PR interval * ms QRS duration 133 ms QT/QTcB 397/478 ms P-R-T axes * 253 69 Atrial fibrillation Nonspecific IVCD with LAD Consider anterior infarct Baseline wander in lead(s) V3  Assessment/Plan Principal Problem:   Fever   Postprandial abdominal pain   Abnormal LFTs Observation/telemetry. Keep NPO. Check RUQ [place]. Monitor LFTs. Gentle IV fluids. Antiemetics as needed. Continue IV antibiotics. Monitor CBC and LFTs. Consult gastroenterology.  Active  Problems: Unspecified atrial fibrillation CHA?DS?-VASc Score of at least 5. Not on anticoagulation. Rate is controlled with no meds..    Essential hypertension Resume antihypertensives once med rec done. Monitor blood pressure and heart rate.    CAD, NATIVE VESSEL Pending med reconciliation: Continue aspirin, amlodipine and isosorbide.    GERD (gastroesophageal reflux disease) Continue famotidine and IV dose.    Hyperlipidemia Hold fish oils or any other agents.    CKD (chronic kidney disease) stage 3, GFR 30-59 ml/min (HCC) Monitor creatinine, GFR and electrolytes.    Polycythemia May be due to volume depletion. Follow-up CBC.     DVT prophylaxis: Full code. Code Status:   DNR. Family Communication:   Disposition Plan:   Patient is from:  SNF.  Anticipated DC to:  SNF.  Anticipated DC date:  03/21/2021.  Anticipated DC barriers: Clinical status. Consults called:   Admission status:  Inpatient/telemetry.  Severity of Illness:  High severity after presenting with fever with history of postprandial pain and abnormal LFTs/imaging on work-up.  The patient will need to remain for IV antibiotic therapy and further evaluation for 48 to 72 hours.  Reubin Milan MD [facility]  How to contact the [facility] Attending or Consulting provider [place] or covering provider during after hours [place], for this patient?   Check the care team in [facility] and look for a) attending/consulting [facility] provider listed and b) the [facility] team listed Log into [email or web address] and use [place]'s universal password to access. If you do not have the password, please contact the hospital operator. Locate the [facility] provider you are looking for under [facility] and page to a number that you can be directly reached. If you still have difficulty reaching the provider, please page the [facility] (Director on Call) for the Hospitalists listed on amion for assistance.  03/19/2021, 2:25 AM   This document  was prepared using Dragon voice recognition software and may contain some unintended transcription errors.

## 2021-03-19 NOTE — Progress Notes (Addendum)
[facility] PROGRESS NOTE   Susan Glass [id number] DOB: 04/14/1917 DOA: 03/18/2021  PCP: Sinda Du, MD  Brief History/Interval Summary: 87 y.o. female with medical history significant of CAD, chronic diastolic heart failure, GERD, history of upper GI bleed, hiatal hernia, hyperlipidemia, hypertension, iron deficiency anemia who was sent from her facility [facility]) due to hypoxia in the 19s, the patient also stated she has been having postprandial pain associated with nausea for several days.  She has been constipated.  Evaluation in the ED was concerning for abnormal LFTs with dilated ducts noted on CT scan.  She was hospitalized for further management.    Consultants: Gastroenterology  Procedures: None  Antibiotics: Anti-infectives (From admission, onward)    Start     Dose/Rate Route Frequency Ordered Stop   03/19/21 0800  cefTRIAXone (ROCEPHIN) 2 g in sodium chloride 0.9 % 100 mL IVPB        2 g 200 mL/hr over 30 Minutes Intravenous Every 24 hours 03/19/21 0456     03/19/21 0800  metroNIDAZOLE (FLAGYL) IVPB 500 mg        500 mg 100 mL/hr over 60 Minutes Intravenous Every 8 hours 03/19/21 0456     03/19/21 0200  piperacillin-tazobactam (ZOSYN) IVPB 3.375 g        3.375 g 100 mL/hr over 30 Minutes Intravenous  Once 03/19/21 0156 03/19/21 0243       Subjective/Interval History: Patient denies any abdominal pain.  She is asking for breakfast this morning.  Denies any shortness of breath or chest pain either.  She does admit to having pain after eating food at her nursing facility yesterday.     Assessment/Plan:  Fever/postprandial abdominal pain/transaminitis Patient's her WBC was 12.0.  The UA did not suggest infection.  CT scan did showed dilated bile ducts.  Cholangitis should be in the differential though she is not particularly tender in the right upper quadrant.  Transaminitis noted on blood work.  Bilirubin noted to be mildly elevated.  Lactic acid  level was normal.  We will check a procalcitonin.  Patient empirically on ceftriaxone and metronidazole which will be continued for now.  She did have a fever 101 F last night.  Follow-up on blood cultures.  Hepatitis panel. She is status postcholecystectomy.  Right upper quadrant ultrasound does show dilated common bile duct.  Gastroenterology has been consulted to assist with management.  Constipation CT scan raises concern for stool in the rectum.  We will order enema.  We will check TSH.  Unspecified atrial fibrillation Not on anticoagulation.  Rate is controlled with no medications.  Essential hypertension Blood pressure reasonably well controlled.  Continue to monitor for now.  History of coronary artery disease She is on aspirin and nitrates at home.  These can be resumed gradually.  No anginal symptoms currently.  History of GERD Continue famotidine.  Chronic kidney disease stage IIIa Renal function is stable.  Monitor urine output.  Avoid nephrotoxic agents.  Mild thrombocytopenia Slightly low platelet count noted this morning.  Continue to trend.  Check B12 and folate levels.    DVT Prophylaxis: Lovenox Code Status: DNR Family Communication: No family at bedside Disposition Plan: Hopefully back to her skilled nursing facility when improved  Status is: Inpatient  Remains inpatient appropriate because:Ongoing diagnostic testing needed not appropriate for outpatient work up and IV treatments appropriate due to intensity of illness or inability to take PO  Dispo: The patient is from: SNF  Anticipated d/c is to: SNF              Patient currently is not medically stable to d/c.   Difficult to place patient No       Medications: Scheduled:  enoxaparin (LOVENOX) injection  40 mg Subcutaneous Q24H   Continuous:  cefTRIAXone (ROCEPHIN)  IV 2 g (03/19/21 0853)   famotidine (PEPCID) IV Stopped (03/19/21 0313)   lactated ringers 50 mL/hr at 03/19/21 0217    metronidazole 500 mg (03/19/21 0844)   [id number] **OR** acetaminophen, ondansetron **OR** ondansetron (ZOFRAN) IV   Objective:  Vital Signs  Vitals:   03/19/21 0142 03/19/21 0200 03/19/21 0305 03/19/21 0600  BP:  (!) 125/52 (!) 162/70 (!) 123/57  Pulse:  67 (!) 53 (!) 59  Resp:  (!) 21 (!) 26 (!) 24  Temp: 97.9 F (36.6 C)     TempSrc: Oral     SpO2:  95% 96% 95%  Height:        Intake/Output Summary (Last 24 hours) at 03/19/2021 [id number] Last data filed at 03/19/2021 0326 Gross per 24 hour  Intake 1088.77 ml  Output --  Net 1088.77 ml   There were no vitals filed for this visit.  General appearance: Awake alert.  In no distress Resp: Clear to auscultation bilaterally.  Normal effort Cardio: S1-S2 is normal regular.  No S3-S4.  No rubs murmurs or bruit GI: Abdomen is soft.  Nontender nondistended.  Bowel sounds are present normal.  No masses organomegaly Extremities: No edema.  Full range of motion of lower extremities. Neurologic: Alert and oriented x3.  No focal neurological deficits.    Lab Results:  Data Reviewed: I have personally reviewed following labs and imaging studies  CBC: Recent Labs  Lab 03/18/21 1954 03/19/21 0357  WBC 12.0* 12.7*  NEUTROABS 10.6* 11.2*  HGB 15.5* 13.3  HCT 48.2* 41.3  MCV 94.7 93.4  PLT 154 133*    Basic Metabolic Panel: Recent Labs  Lab 03/18/21 1954 03/19/21 0357  NA 136 137  K 5.0 4.7  CL 101 105  CO2 26 24  GLUCOSE 119* 120*  BUN 30* 27*  CREATININE 1.08* 0.99  CALCIUM 9.0 8.4*    GFR: CrCl cannot be calculated (Unknown ideal weight.).  Liver Function Tests: Recent Labs  Lab 03/18/21 1954 03/19/21 0357  AST 428* 257*  ALT 229* 220*  ALKPHOS 163* 122  BILITOT 1.6* 1.5*  PROT 7.6 5.9*  ALBUMIN 4.5 3.4*      Recent Results (from the past 240 hour(s))  Culture, blood (routine x 2)     Status: None (Preliminary result)   Collection Time: 03/18/21  8:28 PM   Specimen: BLOOD  Result Value Ref  Range Status   Specimen Description BLOOD BLOOD RIGHT HAND  Final   Special Requests   Final    Blood Culture adequate volume BOTTLES DRAWN AEROBIC AND ANAEROBIC   Culture  Setup Time   Final    NO ORGANISMS SEEN AEROBIC BOTTLE ONLY Performed at [facility], [address], [place], [place] [address]    Culture PENDING  Incomplete   Report Status PENDING  Incomplete  Culture, blood (routine x 2)     Status: None (Preliminary result)   Collection Time: 03/18/21  8:28 PM   Specimen: BLOOD  Result Value Ref Range Status   Specimen Description BLOOD BLOOD RIGHT FOREARM  Final   Special Requests   Final    Blood Culture adequate volume BOTTLES DRAWN AEROBIC AND  ANAEROBIC Performed at [facility], [address] [place], [place] [address]    Culture PENDING  Incomplete   Report Status PENDING  Incomplete  Resp Panel by RT-PCR (Flu A&B, Covid) Nasopharyngeal Swab     Status: None   Collection Time: 03/18/21 11:10 PM   Specimen: Nasopharyngeal Swab; Nasopharyngeal(NP) swabs in vial transport medium  Result Value Ref Range Status   SARS Coronavirus 2 by RT PCR NEGATIVE NEGATIVE Final    Comment: (NOTE) SARS-CoV-2 target nucleic acids are NOT DETECTED.  The SARS-CoV-2 RNA is generally detectable in upper respiratory specimens during the acute phase of infection. The lowest concentration of SARS-CoV-2 viral copies this assay can detect is 138 copies/mL. A negative result does not preclude SARS-Cov-2 infection and should not be used as the sole basis for treatment or other patient management decisions. A negative result may occur with  improper specimen collection/handling, submission of specimen other than nasopharyngeal swab, presence of viral mutation(s) within the areas targeted by this assay, and inadequate number of viral copies(<138 copies/mL). A negative result must be combined with clinical observations, patient history, and epidemiological information. The expected result is  Negative.  Fact Sheet for Patients:  EntrepreneurPulse.com.au  Fact Sheet for Healthcare Providers:  IncredibleEmployment.be  This test is no t yet approved or cleared by the [place] FDA and  has been authorized for detection and/or diagnosis of SARS-CoV-2 by FDA under an Emergency Use Authorization (EUA). This EUA will remain  in effect (meaning this test can be used) for the duration of the COVID-19 declaration under Section 564(b)(1) of the Act, 21 U.S.C.section 360bbb-3(b)(1), unless the authorization is terminated  or revoked sooner.       Influenza A by PCR NEGATIVE NEGATIVE Final   Influenza B by PCR NEGATIVE NEGATIVE Final    Comment: (NOTE) The Xpert Xpress SARS-CoV-2/FLU/RSV plus assay is intended as an aid in the diagnosis of influenza from Nasopharyngeal swab specimens and should not be used as a sole basis for treatment. Nasal washings and aspirates are unacceptable for Xpert Xpress SARS-CoV-2/FLU/RSV testing.  Fact Sheet for Patients: EntrepreneurPulse.com.au  Fact Sheet for Healthcare Providers: IncredibleEmployment.be  This test is not yet approved or cleared by the [place] FDA and has been authorized for detection and/or diagnosis of SARS-CoV-2 by FDA under an Emergency Use Authorization (EUA). This EUA will remain in effect (meaning this test can be used) for the duration of the COVID-19 declaration under Section 564(b)(1) of the Act, 21 U.S.C. section 360bbb-3(b)(1), unless the authorization is terminated or revoked.  Performed at [facility], [address] [place], [place] [address]       Radiology Studies: CT ABDOMEN PELVIS W CONTRAST  Result Date: 03/19/2021 CLINICAL DATA:  Abdominal abscess/infection. EXAM: CT ABDOMEN AND PELVIS WITH CONTRAST TECHNIQUE: Multidetector CT imaging of the abdomen and pelvis was performed using the standard protocol following bolus  administration of intravenous contrast. CONTRAST:  35m OMNIPAQUE IOHEXOL 300 MG/ML SOLN, 379mOMNIPAQUE IOHEXOL 300 MG/ML SOLN COMPARISON:  None. FINDINGS: Lower chest: Diffuse interstitial coarsening. There is cardiomegaly. Coronary vascular calcification. No intra-abdominal free air or free fluid. Hepatobiliary: The liver is unremarkable. There is diffuse intrahepatic and extrahepatic biliary ductal dilatation. The common bile duct measures approximately 13 mm in diameter. No calcified stone noted in the central CBD. Cholecystectomy. Pancreas: No active inflammatory changes. Spleen: Normal in size without focal abnormality. Adrenals/Urinary Tract: The left adrenal glands unremarkable. There is a 2 cm indeterminate right adrenal nodule versus adrenal thickening. There  is no hydronephrosis on either side. There is symmetric enhancement and excretion of contrast by both kidneys. The visualized ureters and urinary bladder appear unremarkable. Stomach/Bowel: There is a moderate size hiatal hernia. Apparent mild thickening and edema of the wall of the distal esophagus and herniated stomach. There is moderate stool throughout the colon. There is sigmoid diverticulosis without active inflammatory changes. Dense stool within the rectal vault may represent a degree of fecal impaction. There is no bowel obstruction. The appendix is not visualized with certainty. No inflammatory changes identified in the right lower quadrant. Vascular/Lymphatic: Advanced aortoiliac atherosclerotic disease. The IVC is unremarkable. No portal venous gas. There is no adenopathy. Reproductive: The uterus is anteverted and grossly unremarkable. Small calcified fibroid. No adnexal masses. Other: None Musculoskeletal: Osteopenia with degenerative changes of the spine. Old left pubic bone fractures with nonunion. No acute fracture. IMPRESSION: 1. Moderate size hiatal hernia with findings of esophagitis. 2. Sigmoid diverticulosis. No bowel  obstruction. 3. Dense stool within the rectal vault may represent a degree of fecal impaction. 4. Diffuse intrahepatic and extrahepatic biliary ductal dilatation, status post prior cholecystectomy. No retained calcified stone noted in the central CBD. Correlation with liver function tests recommended. 5. Aortic Atherosclerosis (ICD10-I70.0). Electronically Signed   By: Anner Crete M.D.   On: 03/19/2021 01:52   DG Chest Port 1 View  Result Date: 03/18/2021 CLINICAL DATA:  Fever EXAM: PORTABLE CHEST 1 VIEW COMPARISON:  12/27/2017 FINDINGS: Lung volumes are small, but appears symmetric. Chronic interstitial thickening again noted. No superimposed confluent pulmonary infiltrate. No pneumothorax or pleural effusion. Cardiac size is mildly enlarged, stable. Pulmonary vasculature is normal. Advanced degenerative changes are noted within the shoulders bilaterally. No acute bone abnormality. IMPRESSION: Pulmonary hypoinflation.  Stable cardiomegaly. Electronically Signed   By: Fidela Salisbury MD   On: 03/18/2021 20:48   US Abdomen Limited RUQ (LIVER/GB)  Result Date: 03/19/2021 CLINICAL DATA:  Abnormal LFTs EXAM: ULTRASOUND ABDOMEN LIMITED RIGHT UPPER QUADRANT COMPARISON:  Same day CT abdomen and pelvis FINDINGS: Gallbladder: Prior cholecystectomy. Common bile duct: Diameter: 1.3 cm, dilated. Liver: No focal lesion identified, although visualization of the liver is limited due to patient respiratory motion. Within normal limits in parenchymal echogenicity. Portal vein is patent on color Doppler imaging with normal direction of blood flow towards the liver. Other: Intrahepatic biliary ductal dilation. IMPRESSION: Dilated common bile duct, measuring up to 1.3 cm. Dilated intrahepatic bile ducts. Electronically Signed   By: Yetta Glassman MD   On: 03/19/2021 08:18       LOS: 0 days   [facility] Pager on [email or web address]  03/19/2021, 9:21 AM

## 2021-03-20 ENCOUNTER — Inpatient Hospital Stay (HOSPITAL_COMMUNITY): Payer: Medicare PPO

## 2021-03-20 DIAGNOSIS — R0600 Dyspnea, unspecified: Secondary | ICD-10-CM

## 2021-03-20 DIAGNOSIS — I10 Essential (primary) hypertension: Secondary | ICD-10-CM

## 2021-03-20 DIAGNOSIS — R509 Fever, unspecified: Secondary | ICD-10-CM

## 2021-03-20 DIAGNOSIS — I5032 Chronic diastolic (congestive) heart failure: Secondary | ICD-10-CM

## 2021-03-20 DIAGNOSIS — I4891 Unspecified atrial fibrillation: Secondary | ICD-10-CM

## 2021-03-20 DIAGNOSIS — K8309 Other cholangitis: Secondary | ICD-10-CM

## 2021-03-20 LAB — COMPREHENSIVE METABOLIC PANEL
ALT: 145 U/L — ABNORMAL HIGH (ref 0–44)
AST: 99 U/L — ABNORMAL HIGH (ref 15–41)
Albumin: 3.3 g/dL — ABNORMAL LOW (ref 3.5–5.0)
Alkaline Phosphatase: 111 U/L (ref 38–126)
Anion gap: 7 (ref 5–15)
BUN: 19 mg/dL (ref 8–23)
CO2: 24 mmol/L (ref 22–32)
Calcium: 8.5 mg/dL — ABNORMAL LOW (ref 8.9–10.3)
Chloride: 109 mmol/L (ref 98–111)
Creatinine, Ser: 0.97 mg/dL (ref 0.44–1.00)
GFR, Estimated: 52 mL/min — ABNORMAL LOW (ref >60)
Glucose, Bld: 81 mg/dL (ref 70–99)
Potassium: 4.5 mmol/L (ref 3.5–5.1)
Sodium: 140 mmol/L (ref 135–145)
Total Bilirubin: 1.4 mg/dL — ABNORMAL HIGH (ref 0.3–1.2)
Total Protein: 5.9 g/dL — ABNORMAL LOW (ref 6.5–8.1)

## 2021-03-20 LAB — GLUCOSE, CAPILLARY
Glucose-Capillary: 83 mg/dL (ref 70–99)
Glucose-Capillary: 112 mg/dL — ABNORMAL HIGH (ref 70–99)
Glucose-Capillary: 132 mg/dL — ABNORMAL HIGH (ref 70–99)

## 2021-03-20 LAB — CBC
HCT: 39.6 % (ref 36.0–46.0)
Hemoglobin: 12.7 g/dL (ref 12.0–15.0)
MCH: 30.4 pg (ref 26.0–34.0)
MCHC: 32.1 g/dL (ref 30.0–36.0)
MCV: 94.7 fL (ref 80.0–100.0)
Platelets: 130 10*3/uL — ABNORMAL LOW (ref 150–400)
RBC: 4.18 MIL/uL (ref 3.87–5.11)
RDW: 14.6 % (ref 11.5–15.5)
WBC: 8.7 10*3/uL (ref 4.0–10.5)
nRBC: 0 % (ref 0.0–0.2)

## 2021-03-20 LAB — ECHOCARDIOGRAM COMPLETE
Area-P 1/2: 5.5 cm2
Height: 60 in
S' Lateral: 3 cm
Weight: 2268.09 oz

## 2021-03-20 MED ORDER — FENTANYL CITRATE (PF) 100 MCG/2ML IJ SOLN
INTRAMUSCULAR | Status: AC
  Filled 2021-03-20: qty 2

## 2021-03-20 MED ORDER — ROCURONIUM BROMIDE 10 MG/ML (PF) SYRINGE
PREFILLED_SYRINGE | INTRAVENOUS | Status: AC
  Filled 2021-03-20: qty 10

## 2021-03-20 MED ORDER — DEXAMETHASONE SODIUM PHOSPHATE 10 MG/ML IJ SOLN
INTRAMUSCULAR | Status: AC
  Filled 2021-03-20: qty 1

## 2021-03-20 MED ORDER — ONDANSETRON HCL 4 MG/2ML IJ SOLN
INTRAMUSCULAR | Status: AC
  Filled 2021-03-20: qty 2

## 2021-03-20 MED ORDER — DEXTROSE-NACL 5-0.45 % IV SOLN: INTRAVENOUS | Status: DC

## 2021-03-20 MED ORDER — SUCCINYLCHOLINE CHLORIDE 200 MG/10ML IV SOSY
PREFILLED_SYRINGE | INTRAVENOUS | Status: AC
  Filled 2021-03-20: qty 10

## 2021-03-20 MED ORDER — LIDOCAINE HCL (PF) 2 % IJ SOLN
INTRAMUSCULAR | Status: AC
  Filled 2021-03-20: qty 5

## 2021-03-20 MED ORDER — PROPOFOL 10 MG/ML IV BOLUS
INTRAVENOUS | Status: AC
  Filled 2021-03-20: qty 40

## 2021-03-20 MED ORDER — FUROSEMIDE 10 MG/ML IJ SOLN
20.0000 mg | Freq: Once | INTRAMUSCULAR | Status: AC
  Administered 2021-03-20: 20 mg via INTRAVENOUS
  Filled 2021-03-20: qty 2

## 2021-03-20 MED ORDER — DEXMEDETOMIDINE (PRECEDEX) IN NS 20 MCG/5ML (4 MCG/ML) IV SYRINGE
PREFILLED_SYRINGE | INTRAVENOUS | Status: AC
  Filled 2021-03-20: qty 5

## 2021-03-20 NOTE — Evaluation (Signed)
Clinical/Bedside Swallow Evaluation Patient Details  Name: Susan Glass MRN: [id number] Date of Birth: [birth year]  Today's Date: 03/20/2021 Time: SLP Start Time (ACUTE ONLY): 1600 SLP Stop Time (ACUTE ONLY): 1620 SLP Time Calculation (min) (ACUTE ONLY): 20 min  Past Medical History:  Past Medical History:  Diagnosis Date   CAD (coronary artery disease)    CHF (congestive heart failure) (HCC)    GERD (gastroesophageal reflux disease)    GI bleed    UGI   Hiatal hernia    large, sliding    Hyperlipidemia    hypercholesterolemia   Hypertension    IDA (iron deficiency anemia)    Past Surgical History:  Past Surgical History:  Procedure Laterality Date   APPENDECTOMY     CHOLECYSTECTOMY     COLONOSCOPY  2008   Dr. Gala Romney: Hyperplastic polyp removed, oozing erosion at the ileocecal valve with benign pathology, likely NSAID related.   ESOPHAGEAL DILATION N/A 11/28/2014   Procedure: ESOPHAGEAL DILATION;  Surgeon: Danie Binder, MD;  Location: [facility];  Service: Endoscopy;  Laterality: N/A;   ESOPHAGOGASTRODUODENOSCOPY N/A 11/28/2014   Dr.Fields- UGI bleed d/t severe distal erosive esophagitis in the setting of GE JXN stricture, large sliding hiatal hernia, mild non-erosive gastritis   HPI:  87 y.o. female with medical history significant of CAD, chronic diastolic heart failure, GERD, history of upper GI bleed, hiatal hernia, hyperlipidemia, hypertension, iron deficiency anemia who was sent from her facility [facility]) due to hypoxia in the 60s, the patient also stated she has been having postprandial pain associated with nausea for several days.  She has been constipated.  Evaluation in the ED was concerning for abnormal LFTs with dilated ducts noted on CT scan.  She was hospitalized for further management.   Assessment / Plan / Recommendation Clinical Impression  Clinical swallowing evaluation completed while Pt was sitting upright in bed; Pt denies swallowing difficutly reporting  "everything goes right down no problem its after that I'm uncomfortable", she was pleasant and very engaged with SLP. Pt consumed thin liquids, puree and regular textures without overt s/sx of aspiration. SLP reviewed universal aspiration precautions with Pt. Recommend continue with regular diet and thin liquids; meds are ok whole with liquids. There are no further ST needs noted at this time. ST will sign off, thank you. SLP Visit Diagnosis: Dysphagia, unspecified (R13.10)    Aspiration Risk  No limitations    Diet Recommendation Regular;Thin liquid   Liquid Administration via: Cup;Straw Medication Administration: Whole meds with liquid Supervision: Patient able to self feed;Intermittent supervision to cue for compensatory strategies Compensations: Slow rate;Small sips/bites Postural Changes: Seated upright at 90 degrees    Other  Recommendations Oral Care Recommendations: Oral care BID   Follow up Recommendations None        Swallow Study   General Date of Onset: 03/18/21 HPI: 87 y.o. female with medical history significant of CAD, chronic diastolic heart failure, GERD, history of upper GI bleed, hiatal hernia, hyperlipidemia, hypertension, iron deficiency anemia who was sent from her facility [facility]) due to hypoxia in the 60s, the patient also stated she has been having postprandial pain associated with nausea for several days.  She has been constipated.  Evaluation in the ED was concerning for abnormal LFTs with dilated ducts noted on CT scan.  She was hospitalized for further management. Type of Study: Bedside Swallow Evaluation Previous Swallow Assessment: none in chart Diet Prior to this Study: Regular;Thin liquids Temperature Spikes Noted: No Respiratory Status: Room air  History of Recent Intubation: No Behavior/Cognition: Alert;Cooperative;Pleasant mood Oral Cavity Assessment: Within Functional Limits Oral Care Completed by SLP: Recent completion by staff Oral Cavity -  Dentition: Adequate natural dentition Vision: Functional for self-feeding Self-Feeding Abilities: Able to feed self Patient Positioning: Upright in bed Baseline Vocal Quality: Normal Volitional Cough: Strong Volitional Swallow: Able to elicit    Oral/Motor/Sensory Function Overall Oral Motor/Sensory Function: Within functional limits   Ice Chips Ice chips: Within functional limits   Thin Liquid Thin Liquid: Within functional limits    Nectar Thick Nectar Thick Liquid: Not tested   Honey Thick Honey Thick Liquid: Not tested   Puree Puree: Within functional limits   Solid     Solid: Within functional limits     Veronnica Hennings H. Roddie Mc, [place] Speech Language Pathologist  Wende Bushy 03/20/2021,4:20 PM

## 2021-03-20 NOTE — Progress Notes (Signed)
Received report from [place], Therapist, sports. Assumed pt care at 0700. A&O. VSS. Pt resting comfortably in bed in no acute distress. NPO. Pt scheduled for ERCP today. Great granddaughter visiting with pt. Pt oriented and conversing appropriately and chronologically. D51/2 NS infusing in LFA PIV w/o complications. Call bell within reach. Will continue to monitor.

## 2021-03-20 NOTE — Progress Notes (Signed)
*  PRELIMINARY RESULTS* Echocardiogram 2D Echocardiogram has been performed.  Susan Glass 03/20/2021, 12:38 PM

## 2021-03-20 NOTE — Progress Notes (Signed)
[facility] PROGRESS NOTE   Susan Glass [id number] DOB: 1917-03-26 DOA: 03/18/2021  PCP: Sinda Du, MD  Brief History/Interval Summary: 87 y.o. female with medical history significant of CAD, chronic diastolic heart failure, GERD, history of upper GI bleed, hiatal hernia, hyperlipidemia, hypertension, iron deficiency anemia who was sent from her facility [facility] due to hypoxia in the 9s, the patient also stated she has been having postprandial pain associated with nausea for several days.  She has been constipated.  Evaluation in the ED was concerning for abnormal LFTs with dilated ducts noted on CT scan.  She was hospitalized for further management.    Consultants: Gastroenterology  Procedures: None  Antibiotics: Anti-infectives (From admission, onward)    Start     Dose/Rate Route Frequency Ordered Stop   03/19/21 0800  cefTRIAXone (ROCEPHIN) 2 g in sodium chloride 0.9 % 100 mL IVPB        2 g 200 mL/hr over 30 Minutes Intravenous Every 24 hours 03/19/21 0456     03/19/21 0800  metroNIDAZOLE (FLAGYL) IVPB 500 mg        500 mg 100 mL/hr over 60 Minutes Intravenous Every 8 hours 03/19/21 0456     03/19/21 0200  piperacillin-tazobactam (ZOSYN) IVPB 3.375 g        3.375 g 100 mL/hr over 30 Minutes Intravenous  Once 03/19/21 0156 03/19/21 0243       Subjective/Interval History: Patient mentions that she is feeling fine this morning.  Feels congested in her chest which has been ongoing for a long period of time.  Does admit to occasional shortness of breath.  Denies any chest pain.  Denies any abdominal pain nausea or vomiting.     Assessment/Plan:  Fever/postprandial abdominal pain/transaminitis Patient's her WBC was 12.0.  The UA did not suggest infection.  CT scan did showed dilated bile ducts.  Cholangitis should be in the differential though she is not particularly tender in the right upper quadrant.   LFTs noted to be still elevated but better than  yesterday.  Bilirubin is mildly elevated as well.  No further episodes of fever.  Remains on ceftriaxone and metronidazole.  Cultures are pending. She is status postcholecystectomy.  Right upper quadrant ultrasound does show dilated common bile duct.  Patient seen by gastroenterology.  ERCP was being planned however patient noted to be dyspneic this morning so this has been postponed for now.   Hepatitis panel is unremarkable.    Dyspnea Few crackles appreciated on examination today.  Chest x-ray however does not show any new findings.  She does have known cardiomegaly.  Anesthesiology would like to get an echocardiogram before she undergoes ERCP.  This has been ordered.   Constipation Patient was placed on laxatives and had a large bowel movement yesterday.  TSH was 0.64.  Free T4 1.5.  Unspecified atrial fibrillation Not on anticoagulation.  Rate is controlled with no medications.  Essential hypertension Blood pressure reasonably well controlled.  Continue to monitor for now.  History of coronary artery disease She is on aspirin and nitrates at home.  These can be resumed gradually.  No anginal symptoms currently.  History of GERD Continue famotidine.  Chronic kidney disease stage IIIa Renal function is stable.  Monitor urine output.  Avoid nephrotoxic agents.  Mild thrombocytopenia Platelet counts are stable.  [id number] was [id number] and folic acid noted to be 18.1.      DVT Prophylaxis: Lovenox Code Status: DNR Family Communication: No family at bedside Disposition Plan:  Hopefully back to her skilled nursing facility when improved  Status is: Inpatient  Remains inpatient appropriate because:Ongoing diagnostic testing needed not appropriate for outpatient work up and IV treatments appropriate due to intensity of illness or inability to take PO  Dispo: The patient is from: SNF              Anticipated d/c is to: SNF              Patient currently is not medically stable to d/c.    Difficult to place patient No       Medications: Scheduled:  isosorbide mononitrate  15 mg Oral Daily   pantoprazole  40 mg Oral BID   polyethylene glycol  17 g Oral BID   rOPINIRole  2 mg Oral QHS   senna-docusate  2 tablet Oral BID   tamsulosin  0.4 mg Oral Daily   Continuous:  cefTRIAXone (ROCEPHIN)  IV Stopped (03/19/21 1100)   metronidazole 500 mg (03/20/21 0845)   [id number] **OR** acetaminophen, ondansetron **OR** ondansetron (ZOFRAN) IV   Objective:  Vital Signs  Vitals:   03/19/21 2015 03/19/21 2054 03/19/21 2107 03/20/21 0521  BP: 114/77  (!) 148/61 (!) 153/96  Pulse: 62  62 68  Resp: '18  19 19  '$ Temp: 97.8 F (36.6 C)  98 F (36.7 C) 98.2 F (36.8 C)  TempSrc: Oral  Oral Oral  SpO2: 98%  96% 95%  Weight:  64.3 kg    Height:        Intake/Output Summary (Last 24 hours) at 03/20/2021 1147 Last data filed at 03/20/2021 0900 Gross per 24 hour  Intake 399.6 ml  Output --  Net 399.6 ml    Filed Weights   03/19/21 2054  Weight: 64.3 kg    General appearance: Awake alert.  In no distress Resp: Mildly tachypneic.  Few crackles in the right base.  No wheezing or rhonchi. Cardio: S1-S2 is normal regular.  No S3-S4.  No rubs murmurs or bruit GI: Abdomen is soft.  Nontender nondistended.  Bowel sounds are present normal.  No masses organomegaly Extremities: No edema.   Neurologic: No focal neurological deficits.    Lab Results:  Data Reviewed: I have personally reviewed following labs and imaging studies  CBC: Recent Labs  Lab 03/18/21 1954 03/19/21 0357 03/20/21 0556  WBC 12.0* 12.7* 8.7  NEUTROABS 10.6* 11.2*  --   HGB 15.5* 13.3 12.7  HCT 48.2* 41.3 39.6  MCV 94.7 93.4 94.7  PLT 154 133* 130*     Basic Metabolic Panel: Recent Labs  Lab 03/18/21 1954 03/19/21 0357 03/20/21 0556  NA 136 137 140  K 5.0 4.7 4.5  CL 101 105 109  CO2 '26 24 24  '$ GLUCOSE 119* 120* 81  BUN 30* 27* 19  CREATININE 1.08* 0.99 0.97  CALCIUM 9.0 8.4*  8.5*     GFR: Estimated Creatinine Clearance: 24.5 mL/min (by C-G formula based on SCr of 0.97 mg/dL).  Liver Function Tests: Recent Labs  Lab 03/18/21 1954 03/19/21 0357 03/20/21 0556  AST 428* 257* 99*  ALT 229* 220* 145*  ALKPHOS 163* 122 111  BILITOT 1.6* 1.5* 1.4*  PROT 7.6 5.9* 5.9*  ALBUMIN 4.5 3.4* 3.3*       Recent Results (from the past 240 hour(s))  Culture, blood (routine x 2)     Status: None (Preliminary result)   Collection Time: 03/18/21  8:28 PM   Specimen: BLOOD  Result Value Ref Range Status   Specimen  Description BLOOD BLOOD RIGHT HAND  Final   Special Requests   Final    Blood Culture adequate volume BOTTLES DRAWN AEROBIC AND ANAEROBIC   Culture  Setup Time NO ORGANISMS SEEN AEROBIC BOTTLE ONLY  Final   Culture   Final    NO GROWTH 2 DAYS Performed at [facility], [address] [place], [place] [address]    Report Status PENDING  Incomplete  Culture, blood (routine x 2)     Status: None (Preliminary result)   Collection Time: 03/18/21  8:28 PM   Specimen: BLOOD  Result Value Ref Range Status   Specimen Description BLOOD BLOOD RIGHT FOREARM  Final   Special Requests   Final    Blood Culture adequate volume BOTTLES DRAWN AEROBIC AND ANAEROBIC   Culture   Final    NO GROWTH 2 DAYS Performed at [facility], [address] [place], [place] [address]    Report Status PENDING  Incomplete  Resp Panel by RT-PCR (Flu A&B, Covid) Nasopharyngeal Swab     Status: None   Collection Time: 03/18/21 11:10 PM   Specimen: Nasopharyngeal Swab; Nasopharyngeal(NP) swabs in vial transport medium  Result Value Ref Range Status   SARS Coronavirus 2 by RT PCR NEGATIVE NEGATIVE Final    Comment: (NOTE) SARS-CoV-2 target nucleic acids are NOT DETECTED.  The SARS-CoV-2 RNA is generally detectable in upper respiratory specimens during the acute phase of infection. The lowest concentration of SARS-CoV-2 viral copies this assay can detect is 138 copies/mL. A  negative result does not preclude SARS-Cov-2 infection and should not be used as the sole basis for treatment or other patient management decisions. A negative result may occur with  improper specimen collection/handling, submission of specimen other than nasopharyngeal swab, presence of viral mutation(s) within the areas targeted by this assay, and inadequate number of viral copies(<138 copies/mL). A negative result must be combined with clinical observations, patient history, and epidemiological information. The expected result is Negative.  Fact Sheet for Patients:  EntrepreneurPulse.com.au  Fact Sheet for Healthcare Providers:  IncredibleEmployment.be  This test is no t yet approved or cleared by the [place] FDA and  has been authorized for detection and/or diagnosis of SARS-CoV-2 by FDA under an Emergency Use Authorization (EUA). This EUA will remain  in effect (meaning this test can be used) for the duration of the COVID-19 declaration under Section 564(b)(1) of the Act, 21 U.S.C.section 360bbb-3(b)(1), unless the authorization is terminated  or revoked sooner.       Influenza A by PCR NEGATIVE NEGATIVE Final   Influenza B by PCR NEGATIVE NEGATIVE Final    Comment: (NOTE) The Xpert Xpress SARS-CoV-2/FLU/RSV plus assay is intended as an aid in the diagnosis of influenza from Nasopharyngeal swab specimens and should not be used as a sole basis for treatment. Nasal washings and aspirates are unacceptable for Xpert Xpress SARS-CoV-2/FLU/RSV testing.  Fact Sheet for Patients: EntrepreneurPulse.com.au  Fact Sheet for Healthcare Providers: IncredibleEmployment.be  This test is not yet approved or cleared by the [place] FDA and has been authorized for detection and/or diagnosis of SARS-CoV-2 by FDA under an Emergency Use Authorization (EUA). This EUA will remain in effect (meaning this test can  be used) for the duration of the COVID-19 declaration under Section 564(b)(1) of the Act, 21 U.S.C. section 360bbb-3(b)(1), unless the authorization is terminated or revoked.  Performed at [facility], [address], [place], [place] [address]        Radiology Studies: CT ABDOMEN PELVIS  W CONTRAST  Result Date: 03/19/2021 CLINICAL DATA:  Abdominal abscess/infection. EXAM: CT ABDOMEN AND PELVIS WITH CONTRAST TECHNIQUE: Multidetector CT imaging of the abdomen and pelvis was performed using the standard protocol following bolus administration of intravenous contrast. CONTRAST:  37m OMNIPAQUE IOHEXOL 300 MG/ML SOLN, 380mOMNIPAQUE IOHEXOL 300 MG/ML SOLN COMPARISON:  None. FINDINGS: Lower chest: Diffuse interstitial coarsening. There is cardiomegaly. Coronary vascular calcification. No intra-abdominal free air or free fluid. Hepatobiliary: The liver is unremarkable. There is diffuse intrahepatic and extrahepatic biliary ductal dilatation. The common bile duct measures approximately 13 mm in diameter. No calcified stone noted in the central CBD. Cholecystectomy. Pancreas: No active inflammatory changes. Spleen: Normal in size without focal abnormality. Adrenals/Urinary Tract: The left adrenal glands unremarkable. There is a 2 cm indeterminate right adrenal nodule versus adrenal thickening. There is no hydronephrosis on either side. There is symmetric enhancement and excretion of contrast by both kidneys. The visualized ureters and urinary bladder appear unremarkable. Stomach/Bowel: There is a moderate size hiatal hernia. Apparent mild thickening and edema of the wall of the distal esophagus and herniated stomach. There is moderate stool throughout the colon. There is sigmoid diverticulosis without active inflammatory changes. Dense stool within the rectal vault may represent a degree of fecal impaction. There is no bowel obstruction. The appendix is not visualized with certainty. No inflammatory changes  identified in the right lower quadrant. Vascular/Lymphatic: Advanced aortoiliac atherosclerotic disease. The IVC is unremarkable. No portal venous gas. There is no adenopathy. Reproductive: The uterus is anteverted and grossly unremarkable. Small calcified fibroid. No adnexal masses. Other: None Musculoskeletal: Osteopenia with degenerative changes of the spine. Old left pubic bone fractures with nonunion. No acute fracture. IMPRESSION: 1. Moderate size hiatal hernia with findings of esophagitis. 2. Sigmoid diverticulosis. No bowel obstruction. 3. Dense stool within the rectal vault may represent a degree of fecal impaction. 4. Diffuse intrahepatic and extrahepatic biliary ductal dilatation, status post prior cholecystectomy. No retained calcified stone noted in the central CBD. Correlation with liver function tests recommended. 5. Aortic Atherosclerosis (ICD10-I70.0). Electronically Signed   By: ArAnner Crete.D.   On: 03/19/2021 01:52   DG CHEST PORT 1 VIEW  Result Date: 03/20/2021 CLINICAL DATA:  Shortness of breath EXAM: PORTABLE CHEST 1 VIEW COMPARISON:  03/18/2021 and prior studies FINDINGS: Cardiomegaly again identified. Chronic interstitial opacities are again identified. There is no evidence of focal airspace disease, suspicious pulmonary nodule/mass, pleural effusion, or pneumothorax. No acute bony abnormalities are identified. Severe degenerative changes in both shoulders again noted. IMPRESSION: Cardiomegaly without evidence of acute cardiopulmonary disease. Electronically Signed   By: JeMargarette Canada.D.   On: 03/20/2021 10:31   DG Chest Port 1 View  Result Date: 03/18/2021 CLINICAL DATA:  Fever EXAM: PORTABLE CHEST 1 VIEW COMPARISON:  12/27/2017 FINDINGS: Lung volumes are small, but appears symmetric. Chronic interstitial thickening again noted. No superimposed confluent pulmonary infiltrate. No pneumothorax or pleural effusion. Cardiac size is mildly enlarged, stable. Pulmonary vasculature is  normal. Advanced degenerative changes are noted within the shoulders bilaterally. No acute bone abnormality. IMPRESSION: Pulmonary hypoinflation.  Stable cardiomegaly. Electronically Signed   By: AsFidela SalisburyD   On: 03/18/2021 20:48   USKoreabdomen Limited RUQ (LIVER/GB)  Result Date: 03/19/2021 CLINICAL DATA:  Abnormal LFTs EXAM: ULTRASOUND ABDOMEN LIMITED RIGHT UPPER QUADRANT COMPARISON:  Same day CT abdomen and pelvis FINDINGS: Gallbladder: Prior cholecystectomy. Common bile duct: Diameter: 1.3 cm, dilated. Liver: No focal lesion identified, although visualization of the liver is limited due to patient  respiratory motion. Within normal limits in parenchymal echogenicity. Portal vein is patent on color Doppler imaging with normal direction of blood flow towards the liver. Other: Intrahepatic biliary ductal dilation. IMPRESSION: Dilated common bile duct, measuring up to 1.3 cm. Dilated intrahepatic bile ducts. Electronically Signed   By: Yetta Glassman MD   On: 03/19/2021 08:18       LOS: 1 day   [facility] Pager on [email or web address]  03/20/2021, 11:47 AM

## 2021-03-20 NOTE — Progress Notes (Signed)
Patient feels better.  She says she has been having intermittent shortness of breath. Chest film reveals cardiomegaly and increased lung markings bilaterally not change chest film from admission. ECG reveals atrial fibrillation with ventricular rate of 82/min and left bundle branch block.  We will change since EKG of 2 days ago. Echo completed.  EF possibly in 55 to 56% range.  Official reading pending.  Diet advanced to heart healthy. For repeat lab in AM. Patient will be reevaluated in a.m. to determine if we should proceed with ERCP.

## 2021-03-20 NOTE — Progress Notes (Addendum)
Subjective:  Patient complains shortness of breath.  She denies chest pain abdominal pain nausea or vomiting.  Current Medications:  Current Facility-Administered Medications:    acetaminophen (TYLENOL) tablet 650 mg, 650 mg, Oral, Q6H PRN **OR** acetaminophen (TYLENOL) suppository 650 mg, 650 mg, Rectal, Q6H PRN, Reubin Milan, MD   cefTRIAXone (ROCEPHIN) 2 g in sodium chloride 0.9 % 100 mL IVPB, 2 g, Intravenous, Q24H, Reubin Milan, MD, Stopped at 03/19/21 1100   dextrose 5 %-0.45 % sodium chloride infusion, , Intravenous, Continuous, Bonnielee Haff, MD, Last Rate: 50 mL/hr at 03/20/21 0838, New Bag at 03/20/21 0838   furosemide (LASIX) injection 20 mg, 20 mg, Intravenous, Once, Donney Caraveo U, MD   isosorbide mononitrate (IMDUR) 24 hr tablet 15 mg, 15 mg, Oral, Daily, Bonnielee Haff, MD, 15 mg at 03/19/21 1041   metroNIDAZOLE (FLAGYL) IVPB 500 mg, 500 mg, Intravenous, Q8H, Reubin Milan, MD, Last Rate: 100 mL/hr at 03/20/21 0845, 500 mg at 03/20/21 0845   ondansetron (ZOFRAN) tablet 4 mg, 4 mg, Oral, Q6H PRN **OR** ondansetron (ZOFRAN) injection 4 mg, 4 mg, Intravenous, Q6H PRN, Reubin Milan, MD   pantoprazole (PROTONIX) EC tablet 40 mg, 40 mg, Oral, BID, Harper, Kristen S, PA-C, 40 mg at 03/19/21 2300   polyethylene glycol (MIRALAX / GLYCOLAX) packet 17 g, 17 g, Oral, BID, Bonnielee Haff, MD, 17 g at 03/19/21 2300   rOPINIRole (REQUIP) tablet 2 mg, 2 mg, Oral, QHS, Bonnielee Haff, MD, 2 mg at 03/19/21 2200   senna-docusate (Senokot-S) tablet 2 tablet, 2 tablet, Oral, BID, Bonnielee Haff, MD, 2 tablet at 03/19/21 2300   tamsulosin (FLOMAX) capsule 0.4 mg, 0.4 mg, Oral, Daily, Bonnielee Haff, MD, 0.4 mg at 03/19/21 1041 Yes  Objective: Blood pressure (!) 153/96, pulse 68, temperature 98.2 F (36.8 C), temperature source Oral, resp. rate 19, height 5' (1.524 m), weight 64.3 kg, SpO2 95 %. Patient is alert and in no acute distress. She is doing FaceTime  with her daughter. Conjunctiva is pink. Sclera is nonicteric Oropharyngeal mucosa is normal. No neck masses or thyromegaly noted. Cardiac exam with irregular rhythm normal S1 and S2. No murmur or gallop noted. Auscultation lungs reveal bibasilar rales. Abdomen is soft and nontender with organomegaly or masses. No LE edema.  Labs/studies Results:   CBC Latest Ref Rng & Units 03/20/2021 03/19/2021 03/18/2021  WBC 4.0 - 10.5 K/uL 8.7 12.7(H) 12.0(H)  Hemoglobin 12.0 - 15.0 g/dL 12.7 13.3 15.5(H)  Hematocrit 36.0 - 46.0 % 39.6 41.3 48.2(H)  Platelets 150 - 400 K/uL 130(L) 133(L) 154    CMP Latest Ref Rng & Units 03/20/2021 03/19/2021 03/18/2021  Glucose 70 - 99 mg/dL 81 120(H) 119(H)  BUN 8 - 23 mg/dL 19 27(H) 30(H)  Creatinine 0.44 - 1.00 mg/dL 0.97 0.99 1.08(H)  Sodium 135 - 145 mmol/L 140 137 136  Potassium 3.5 - 5.1 mmol/L 4.5 4.7 5.0  Chloride 98 - 111 mmol/L 109 105 101  CO2 22 - 32 mmol/L '24 24 26  ' Calcium 8.9 - 10.3 mg/dL 8.5(L) 8.4(L) 9.0  Total Protein 6.5 - 8.1 g/dL 5.9(L) 5.9(L) 7.6  Total Bilirubin 0.3 - 1.2 mg/dL 1.4(H) 1.5(H) 1.6(H)  Alkaline Phos 38 - 126 U/L 111 122 163(H)  AST 15 - 41 U/L 99(H) 257(H) 428(H)  ALT 0 - 44 U/L 145(H) 220(H) 229(H)    Hepatic Function Latest Ref Rng & Units 03/20/2021 03/19/2021 03/18/2021  Total Protein 6.5 - 8.1 g/dL 5.9(L) 5.9(L) 7.6  Albumin 3.5 - 5.0 g/dL  3.3(L) 3.4(L) 4.5  AST 15 - 41 U/L 99(H) 257(H) 428(H)  ALT 0 - 44 U/L 145(H) 220(H) 229(H)  Alk Phosphatase 38 - 126 U/L 111 122 163(H)  Total Bilirubin 0.3 - 1.2 mg/dL 1.4(H) 1.5(H) 1.6(H)    Blood cultures negative at 2 days.  Assessment:  #1.  Acute cholangitis.  Patient presented with cute onset of rigors fever right upper quadrant abdominal pain and abnormal LFTs.  She has markedly dilated extra and intrahepatic biliary radicles.  Mild leukocytosis has resolved.  Transaminases and bilirubin are trending downwards.  Patient is on ceftriaxone and flagyl.  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 Patient was scheduled for ERCP with sphincterotomy today.  Procedure to be delayed as she is short of breath.  #2.  Dyspnea at rest.  She has by basilar rales.  She has a history of CHF.  She is only getting 50 mL of IV fluids.  She may be in mild CHF.  #3.  History of coronary artery disease  #4.  Chronic atrial fibrillation.   Plan:  Furosemide 20 mg IV now ECG. Portable chest film. Hold off on ERCP.

## 2021-03-20 NOTE — Progress Notes (Signed)
83 glu

## 2021-03-21 LAB — COMPREHENSIVE METABOLIC PANEL
ALT: 95 U/L — ABNORMAL HIGH (ref 0–44)
AST: 50 U/L — ABNORMAL HIGH (ref 15–41)
Albumin: 3.1 g/dL — ABNORMAL LOW (ref 3.5–5.0)
Alkaline Phosphatase: 117 U/L (ref 38–126)
Anion gap: 6 (ref 5–15)
BUN: 16 mg/dL (ref 8–23)
CO2: 24 mmol/L (ref 22–32)
Calcium: 8.2 mg/dL — ABNORMAL LOW (ref 8.9–10.3)
Chloride: 105 mmol/L (ref 98–111)
Creatinine, Ser: 0.85 mg/dL (ref 0.44–1.00)
GFR, Estimated: >60 mL/min (ref >60)
Glucose, Bld: 116 mg/dL — ABNORMAL HIGH (ref 70–99)
Potassium: 4.3 mmol/L (ref 3.5–5.1)
Sodium: 135 mmol/L (ref 135–145)
Total Bilirubin: 0.9 mg/dL (ref 0.3–1.2)
Total Protein: 5.6 g/dL — ABNORMAL LOW (ref 6.5–8.1)

## 2021-03-21 LAB — GLUCOSE, CAPILLARY
Glucose-Capillary: 111 mg/dL — ABNORMAL HIGH (ref 70–99)
Glucose-Capillary: 116 mg/dL — ABNORMAL HIGH (ref 70–99)
Glucose-Capillary: 116 mg/dL — ABNORMAL HIGH (ref 70–99)
Glucose-Capillary: 86 mg/dL (ref 70–99)
Glucose-Capillary: 95 mg/dL (ref 70–99)

## 2021-03-21 LAB — CBC
HCT: 37.6 % (ref 36.0–46.0)
Hemoglobin: 12.1 g/dL (ref 12.0–15.0)
MCH: 30.6 pg (ref 26.0–34.0)
MCHC: 32.2 g/dL (ref 30.0–36.0)
MCV: 95.2 fL (ref 80.0–100.0)
Platelets: 134 10*3/uL — ABNORMAL LOW (ref 150–400)
RBC: 3.95 MIL/uL (ref 3.87–5.11)
RDW: 14.4 % (ref 11.5–15.5)
WBC: 7.7 10*3/uL (ref 4.0–10.5)
nRBC: 0 % (ref 0.0–0.2)

## 2021-03-21 NOTE — Progress Notes (Signed)
Subjective:  Patient has no complaints.  She says she is not short of breath today.  She ate more than 50% of her breakfast.  She denies abdominal pain nausea or vomiting.  She did have a bowel movement this morning.  Current Medications:  Current Facility-Administered Medications:    acetaminophen (TYLENOL) tablet 650 mg, 650 mg, Oral, Q6H PRN **OR** acetaminophen (TYLENOL) suppository 650 mg, 650 mg, Rectal, Q6H PRN, Ortiz, David Manuel, MD   cefTRIAXone (ROCEPHIN) 2 g in sodium chloride 0.9 % 100 mL IVPB, 2 g, Intravenous, Q24H, Ortiz, David Manuel, MD, Last Rate: 200 mL/hr at 03/21/21 0844, 2 g at 03/21/21 0844   isosorbide mononitrate (IMDUR) 24 hr tablet 15 mg, 15 mg, Oral, Daily, Krishnan, Gokul, MD, 15 mg at 03/21/21 0834   metroNIDAZOLE (FLAGYL) IVPB 500 mg, 500 mg, Intravenous, Q8H, Ortiz, David Manuel, MD, Last Rate: 100 mL/hr at 03/21/21 1032, 500 mg at 03/21/21 1032   ondansetron (ZOFRAN) tablet 4 mg, 4 mg, Oral, Q6H PRN **OR** ondansetron (ZOFRAN) injection 4 mg, 4 mg, Intravenous, Q6H PRN, Ortiz, David Manuel, MD   pantoprazole (PROTONIX) EC tablet 40 mg, 40 mg, Oral, BID, Harper, Kristen S, PA-C, 40 mg at 03/21/21 0833   polyethylene glycol (MIRALAX / GLYCOLAX) packet 17 g, 17 g, Oral, BID, Krishnan, Gokul, MD, 17 g at 03/20/21 2250   rOPINIRole (REQUIP) tablet 2 mg, 2 mg, Oral, QHS, Krishnan, Gokul, MD, 2 mg at 03/20/21 2250   senna-docusate (Senokot-S) tablet 2 tablet, 2 tablet, Oral, BID, Krishnan, Gokul, MD, 2 tablet at 03/21/21 0834   tamsulosin (FLOMAX) capsule 0.4 mg, 0.4 mg, Oral, Daily, Krishnan, Gokul, MD, 0.4 mg at 03/21/21 0834 Yes  Objective: Blood pressure 123/78, pulse 62, temperature 98.6 F (37 C), temperature source Oral, resp. rate 18, height 5' (1.524 m), weight 64.3 kg, SpO2 94 %. Patient is alert and in no acute distress. She has hearing impairment. JVD does not appear to be elevated. Cardiac exam with irregular rhythm normal S1 and S2.  No murmur  gallop noted. Auscultation lungs reveal vesicular breath sounds with few crackles at left base. Abdomen remains soft and nontender with organomegaly or masses. No LE edema noted.  Labs/studies Results:   CBC Latest Ref Rng & Units 03/21/2021 03/20/2021 03/19/2021  WBC 4.0 - 10.5 K/uL 7.7 8.7 12.7(H)  Hemoglobin 12.0 - 15.0 g/dL 12.1 12.7 13.3  Hematocrit 36.0 - 46.0 % 37.6 39.6 41.3  Platelets 150 - 400 K/uL 134(L) 130(L) 133(L)    CMP Latest Ref Rng & Units 03/21/2021 03/20/2021 03/19/2021  Glucose 70 - 99 mg/dL 116(H) 81 120(H)  BUN 8 - 23 mg/dL 16 19 27(H)  Creatinine 0.44 - 1.00 mg/dL 0.85 0.97 0.99  Sodium 135 - 145 mmol/L 135 140 137  Potassium 3.5 - 5.1 mmol/L 4.3 4.5 4.7  Chloride 98 - 111 mmol/L 105 109 105  CO2 22 - 32 mmol/L 24 24 24  Calcium 8.9 - 10.3 mg/dL 8.2(L) 8.5(L) 8.4(L)  Total Protein 6.5 - 8.1 g/dL 5.6(L) 5.9(L) 5.9(L)  Total Bilirubin 0.3 - 1.2 mg/dL 0.9 1.4(H) 1.5(H)  Alkaline Phos 38 - 126 U/L 117 111 122  AST 15 - 41 U/L 50(H) 99(H) 257(H)  ALT 0 - 44 U/L 95(H) 145(H) 220(H)    Hepatic Function Latest Ref Rng & Units 03/21/2021 03/20/2021 03/19/2021  Total Protein 6.5 - 8.1 g/dL 5.6(L) 5.9(L) 5.9(L)  Albumin 3.5 - 5.0 g/dL 3.1(L) 3.3(L) 3.4(L)  AST 15 - 41 U/L 50(H) 99(H) 257(H)  ALT   0 - 44 U/L 95(H) 145(H) 220(H)  Alk Phosphatase 38 - 126 U/L 117 111 122  Total Bilirubin 0.3 - 1.2 mg/dL 0.9 1.4(H) 1.5(H)    Blood cultures.  No growth in 3 days  HBsAg nonreactive Hep A IgM antibody nonreactive Hep B C IgM antibody nonreactive HCV antibody nonreactive  Summary of ECHO  findings from yesterday  1.  Left ventricular ejection fraction, by estimation, is 50 to 55%. The left ventricle has low normal function. There is mild left ventricular hypertrophy. Left ventricular diastolic parameters are indeterminate. Elevated left atrial pressure.  2.  Right ventricular systolic function is normal. The right ventricular size is normal. There is normal pulmonary artery  systolic pressure. 3. Left atrial size was severely dilated. 4. Right atrial size was moderately dilated. 5. Mild mitral valve regurgitation. The aortic valve is abnormal. Aortic valve regurgitation is mild. Mild aortic valve sclerosis is present, with no evidence of aortic valve stenosis.  Assessment:  #1.  Acute cholangitis.  Patient presented with typical symptoms consisting of rigors fever right upper quadrant pain and elevated transaminases.  She had markedly dilated extra and intrahepatic biliary system.  She is status post remote cholecystectomy.  CT and ultrasound did not reveal any stones which was felt to be the culprit and other etiologies less likely.  She is on Zosyn.  She has remained afebrile. She was scheduled for ERCP yesterday but she was having shortness of breath.  She is not deemed to be stable. Since she is improving 1 could argue not to proceed with ERCP but her system is markedly dilated and therefore she is at risk for recurrent cholangitis.  Therefore diagnostic and therapeutic ERCP would be appropriate. Anesthesia recommends waiting another 24 hours.  Patient's family notified.  #2.  Chronic CHF.  ECHO yesterday revealed EF of 50 to 55%.  Other findings as above.  She appears to be at baseline.  Interstitial lung markings appear to be chronic.  #3.  History of coronary artery disease.  #4.  Chronic atrial fibrillation.  Patient is not anticoagulated.  #5.  Mild thrombocytopenia.  Of no concern.   Plan:  ERCP with sphincterotomy on 03/22/2021.       

## 2021-03-21 NOTE — Progress Notes (Signed)
[facility] PROGRESS NOTE   KOLIE RISSMAN [id number] DOB: 08/13/1917 DOA: 03/18/2021  PCP: Sinda Du, MD  Brief History/Interval Summary: 87 y.o. female with medical history significant of CAD, chronic diastolic heart failure, GERD, history of upper GI bleed, hiatal hernia, hyperlipidemia, hypertension, iron deficiency anemia who was sent from her facility [facility]) due to hypoxia in the 28s, the patient also stated she has been having postprandial pain associated with nausea for several days.  She has been constipated.  Evaluation in the ED was concerning for abnormal LFTs with dilated ducts noted on CT scan.  She was hospitalized for further management.    Consultants: Gastroenterology  Procedures: None  Antibiotics: Anti-infectives (From admission, onward)    Start     Dose/Rate Route Frequency Ordered Stop   03/19/21 0800  cefTRIAXone (ROCEPHIN) 2 g in sodium chloride 0.9 % 100 mL IVPB        2 g 200 mL/hr over 30 Minutes Intravenous Every 24 hours 03/19/21 0456     03/19/21 0800  metroNIDAZOLE (FLAGYL) IVPB 500 mg        500 mg 100 mL/hr over 60 Minutes Intravenous Every 8 hours 03/19/21 0456     03/19/21 0200  piperacillin-tazobactam (ZOSYN) IVPB 3.375 g        3.375 g 100 mL/hr over 30 Minutes Intravenous  Once 03/19/21 0156 03/19/21 0243       Subjective/Interval History: Patient mentions that she is feeling well.  Denies any abdominal pain nausea vomiting.  Chest congestion seems to be better.  Denies any shortness of breath or chest pain.     Assessment/Plan:  Concern for acute cholangitis/abnormal LFTs Patient's her WBC was 12.0.  The UA did not suggest infection.  CT scan did showed dilated bile ducts.  Even though she was not particularly tender in the right upper quadrant, considering her dilated ducts, concern was for acute cholangitis. Gastroenterology is following.  ERCP was planned for yesterday however due to her respiratory status it had to  be postponed.  Tentatively planned for tomorrow. Continue ceftriaxone and metronidazole.  LFTs are improving. She is status postcholecystectomy.  Right upper quadrant ultrasound does show dilated common bile duct.  Hepatitis panel is unremarkable.    Dyspnea Chest x-ray did not show any new findings.  Echocardiogram shows normal systolic function.  Remains without oxygen currently.  She was given small dose of Lasix yesterday.  There is definitely chronic element to her symptoms.   Constipation Relieved with laxatives.  TSH was 0.64.  Free T4 1.5.  Unspecified atrial fibrillation Not on anticoagulation.  Rate is controlled with no medications.  Essential hypertension Blood pressure reasonably well controlled.  Continue to monitor for now.  History of coronary artery disease She is on aspirin and nitrates at home.  These can be resumed gradually.  No anginal symptoms currently.  History of GERD Continue famotidine.  Chronic kidney disease stage IIIa Renal function is stable.  Monitor urine output.  Avoid nephrotoxic agents.  Mild thrombocytopenia Platelet counts are stable.  [id number] was [id number] and folic acid noted to be 18.1.      DVT Prophylaxis: Lovenox Code Status: DNR Family Communication: No family at bedside Disposition Plan: Hopefully back to her skilled nursing facility when improved  Status is: Inpatient  Remains inpatient appropriate because:Ongoing diagnostic testing needed not appropriate for outpatient work up and IV treatments appropriate due to intensity of illness or inability to take PO  Dispo: The patient is from: SNF  Anticipated d/c is to: SNF              Patient currently is not medically stable to d/c.   Difficult to place patient No       Medications: Scheduled:  isosorbide mononitrate  15 mg Oral Daily   pantoprazole  40 mg Oral BID   polyethylene glycol  17 g Oral BID   rOPINIRole  2 mg Oral QHS   senna-docusate  2 tablet Oral BID    tamsulosin  0.4 mg Oral Daily   Continuous:  cefTRIAXone (ROCEPHIN)  IV 2 g (03/20/21 1200)   metronidazole 500 mg (03/20/21 2258)   [id number] **OR** acetaminophen, ondansetron **OR** ondansetron (ZOFRAN) IV   Objective:  Vital Signs  Vitals:   03/20/21 0521 03/20/21 1418 03/20/21 2249 03/21/21 0533  BP: (!) 153/96 124/87 119/69 123/78  Pulse: 68 (!) 49 68 62  Resp: '19 18 18 18  '$ Temp: 98.2 F (36.8 C) 97.8 F (36.6 C) 99.3 F (37.4 C) 98.6 F (37 C)  TempSrc: Oral Oral Oral Oral  SpO2: 95% 94% 96% 94%  Weight:      Height:        Intake/Output Summary (Last 24 hours) at 03/21/2021 0812 Last data filed at 03/20/2021 2000 Gross per 24 hour  Intake 300 ml  Output 600 ml  Net -300 ml    Filed Weights   03/19/21 2054  Weight: 64.3 kg    General appearance: Awake alert.  In no distress Resp: Coarse breath sounds bilaterally.  Normal effort noted this morning.  No wheezing or rhonchi.   Cardio: S1-S2 is normal regular.  No S3-S4.  No rubs murmurs or bruit GI: Abdomen is soft.  Nontender nondistended.  Bowel sounds are present normal.  No masses organomegaly Extremities: No edema.  Able to move all of her extremities Neurologic: No focal neurological deficits.    Lab Results:  Data Reviewed: I have personally reviewed following labs and imaging studies  CBC: Recent Labs  Lab 03/18/21 1954 03/19/21 0357 03/20/21 0556 03/21/21 0558  WBC 12.0* 12.7* 8.7 7.7  NEUTROABS 10.6* 11.2*  --   --   HGB 15.5* 13.3 12.7 12.1  HCT 48.2* 41.3 39.6 37.6  MCV 94.7 93.4 94.7 95.2  PLT 154 133* 130* 134*     Basic Metabolic Panel: Recent Labs  Lab 03/18/21 1954 03/19/21 0357 03/20/21 0556  NA 136 137 140  K 5.0 4.7 4.5  CL 101 105 109  CO2 '26 24 24  '$ GLUCOSE 119* 120* 81  BUN 30* 27* 19  CREATININE 1.08* 0.99 0.97  CALCIUM 9.0 8.4* 8.5*     GFR: Estimated Creatinine Clearance: 24.5 mL/min (by C-G formula based on SCr of 0.97 mg/dL).  Liver Function  Tests: Recent Labs  Lab 03/18/21 1954 03/19/21 0357 03/20/21 0556  AST 428* 257* 99*  ALT 229* 220* 145*  ALKPHOS 163* 122 111  BILITOT 1.6* 1.5* 1.4*  PROT 7.6 5.9* 5.9*  ALBUMIN 4.5 3.4* 3.3*       Recent Results (from the past 240 hour(s))  Culture, blood (routine x 2)     Status: None (Preliminary result)   Collection Time: 03/18/21  8:28 PM   Specimen: BLOOD  Result Value Ref Range Status   Specimen Description BLOOD BLOOD RIGHT HAND  Final   Special Requests   Final    Blood Culture adequate volume BOTTLES DRAWN AEROBIC AND ANAEROBIC   Culture   Final    NO GROWTH 2  DAYS Performed at [facility], [address] [place], [place] [address]    Report Status PENDING  Incomplete  Culture, blood (routine x 2)     Status: None (Preliminary result)   Collection Time: 03/18/21  8:28 PM   Specimen: BLOOD  Result Value Ref Range Status   Specimen Description BLOOD BLOOD RIGHT FOREARM  Final   Special Requests   Final    Blood Culture adequate volume BOTTLES DRAWN AEROBIC AND ANAEROBIC   Culture   Final    NO GROWTH 2 DAYS Performed at [facility], [address] [place], [place] [address]    Report Status PENDING  Incomplete  Resp Panel by RT-PCR (Flu A&B, Covid) Nasopharyngeal Swab     Status: None   Collection Time: 03/18/21 11:10 PM   Specimen: Nasopharyngeal Swab; Nasopharyngeal(NP) swabs in vial transport medium  Result Value Ref Range Status   SARS Coronavirus 2 by RT PCR NEGATIVE NEGATIVE Final    Comment: (NOTE) SARS-CoV-2 target nucleic acids are NOT DETECTED.  The SARS-CoV-2 RNA is generally detectable in upper respiratory specimens during the acute phase of infection. The lowest concentration of SARS-CoV-2 viral copies this assay can detect is 138 copies/mL. A negative result does not preclude SARS-Cov-2 infection and should not be used as the sole basis for treatment or other patient management decisions. A negative result may occur with  improper  specimen collection/handling, submission of specimen other than nasopharyngeal swab, presence of viral mutation(s) within the areas targeted by this assay, and inadequate number of viral copies(<138 copies/mL). A negative result must be combined with clinical observations, patient history, and epidemiological information. The expected result is Negative.  Fact Sheet for Patients:  EntrepreneurPulse.com.au  Fact Sheet for Healthcare Providers:  IncredibleEmployment.be  This test is no t yet approved or cleared by the [place] FDA and  has been authorized for detection and/or diagnosis of SARS-CoV-2 by FDA under an Emergency Use Authorization (EUA). This EUA will remain  in effect (meaning this test can be used) for the duration of the COVID-19 declaration under Section 564(b)(1) of the Act, 21 U.S.C.section 360bbb-3(b)(1), unless the authorization is terminated  or revoked sooner.       Influenza A by PCR NEGATIVE NEGATIVE Final   Influenza B by PCR NEGATIVE NEGATIVE Final    Comment: (NOTE) The Xpert Xpress SARS-CoV-2/FLU/RSV plus assay is intended as an aid in the diagnosis of influenza from Nasopharyngeal swab specimens and should not be used as a sole basis for treatment. Nasal washings and aspirates are unacceptable for Xpert Xpress SARS-CoV-2/FLU/RSV testing.  Fact Sheet for Patients: EntrepreneurPulse.com.au  Fact Sheet for Healthcare Providers: IncredibleEmployment.be  This test is not yet approved or cleared by the [place] FDA and has been authorized for detection and/or diagnosis of SARS-CoV-2 by FDA under an Emergency Use Authorization (EUA). This EUA will remain in effect (meaning this test can be used) for the duration of the COVID-19 declaration under Section 564(b)(1) of the Act, 21 U.S.C. section 360bbb-3(b)(1), unless the authorization is terminated or revoked.  Performed at  [facility], [address] [place], [place] [address]        Radiology Studies: DG CHEST PORT 1 VIEW  Result Date: 03/20/2021 CLINICAL DATA:  Shortness of breath EXAM: PORTABLE CHEST 1 VIEW COMPARISON:  03/18/2021 and prior studies FINDINGS: Cardiomegaly again identified. Chronic interstitial opacities are again identified. There is no evidence of focal airspace disease, suspicious pulmonary nodule/mass, pleural effusion, or pneumothorax. No acute bony abnormalities  are identified. Severe degenerative changes in both shoulders again noted. IMPRESSION: Cardiomegaly without evidence of acute cardiopulmonary disease. Electronically Signed   By: Margarette Canada M.D.   On: 03/20/2021 10:31   ECHOCARDIOGRAM COMPLETE  Result Date: 03/20/2021    ECHOCARDIOGRAM REPORT   Patient Name:   Susan Glass Date of Exam: 03/20/2021 Medical Rec #:  [id number]      Height:       60.0 in Accession #:    [id number]     Weight:       141.8 lb Date of Birth:  Feb 27, 1917       BSA:          1.613 m Patient Age:    89 years      BP:           153/96 mmHg Patient Gender: F              HR:           68 bpm. Exam Location:  [facility] Procedure: 2D Echo, Cardiac Doppler and Color Doppler Indications:    Congestive Heart Failure I50.9  History:        Patient has prior history of Echocardiogram examinations, most                 recent 10/08/2008. CHF, CAD; Risk Factors:Hypertension and                 Dyslipidemia. CKD (chronic kidney disease) stage 3.  Sonographer:    Alvino Chapel RCS Referring Phys: Steele  1. Left ventricular ejection fraction, by estimation, is 50 to 55%. The left ventricle has low normal function. There is mild left ventricular hypertrophy. Left ventricular diastolic parameters are indeterminate. Elevated left atrial pressure.  2. Right ventricular systolic function is normal. The right ventricular size is normal. There is normal pulmonary artery systolic pressure.  3. Left atrial size  was severely dilated.  4. Right atrial size was moderately dilated.  5. Mild mitral valve regurgitation.  6. The aortic valve is abnormal. Aortic valve regurgitation is mild. Mild aortic valve sclerosis is present, with no evidence of aortic valve stenosis. FINDINGS  Left Ventricle: Left ventricular ejection fraction, by estimation, is 50 to 55%. The left ventricle has low normal function. The left ventricular internal cavity size was normal in size. There is mild left ventricular hypertrophy. Left ventricular diastolic parameters are indeterminate. Elevated left atrial pressure. Right Ventricle: The right ventricular size is normal. Right vetricular wall thickness was not assessed. Right ventricular systolic function is normal. There is normal pulmonary artery systolic pressure. The tricuspid regurgitant velocity is 2.85 m/s, and with an assumed right atrial pressure of 3 mmHg, the estimated right ventricular systolic pressure is [id number] mmHg. Left Atrium: Left atrial size was severely dilated. Right Atrium: Right atrial size was moderately dilated. Pericardium: There is no evidence of pericardial effusion. Mitral Valve: There is mild thickening of the mitral valve leaflet(s). Mild mitral valve regurgitation. Tricuspid Valve: The tricuspid valve is normal in structure. Tricuspid valve regurgitation is trivial. Aortic Valve: The aortic valve is abnormal. Aortic valve regurgitation is mild. Mild aortic valve sclerosis is present, with no evidence of aortic valve stenosis. Pulmonic Valve: The pulmonic valve was normal in structure. Pulmonic valve regurgitation is mild. Aorta: The aortic root and ascending aorta are structurally normal, with no evidence of dilitation. IAS/Shunts: No atrial level shunt detected by color flow Doppler.  LEFT VENTRICLE PLAX 2D LVIDd:  4.30 cm  Diastology LVIDs:         3.00 cm  LV e' medial:    5.44 cm/s LV PW:         1.20 cm  LV E/e' medial:  21.1 LV IVS:        1.20 cm  LV e'  lateral:   9.03 cm/s LVOT diam:     2.10 cm  LV E/e' lateral: 12.7 LV SV:         52 LV SV Index:   32 LVOT Area:     3.46 cm  RIGHT VENTRICLE RV S prime:     10.60 cm/s TAPSE (M-mode): 1.8 cm LEFT ATRIUM              Index       RIGHT ATRIUM           Index LA diam:        3.97 cm  2.46 cm/m  RA Area:     21.30 cm LA Vol (A2C):   94.2 ml  58.42 ml/m RA Volume:   63.60 ml  39.44 ml/m LA Vol (A4C):   103.0 ml 63.87 ml/m LA Biplane Vol: 99.5 ml  61.70 ml/m  AORTIC VALVE LVOT Vmax:   77.70 cm/s LVOT Vmean:  47.500 cm/s LVOT VTI:    0.150 m  AORTA Ao Root diam: 3.10 cm MITRAL VALVE                TRICUSPID VALVE MV Area (PHT): 5.50 cm     TR Peak grad:   32.5 mmHg MV Decel Time: 138 msec     TR Vmax:        285.00 cm/s MV E velocity: 115.00 cm/s MV A velocity: 50.10 cm/s   SHUNTS MV E/A ratio:  2.30         Systemic VTI:  0.15 m                             Systemic Diam: 2.10 cm Dorris Carnes MD Electronically signed by Dorris Carnes MD Signature Date/Time: 03/20/2021/2:45:23 PM    Final        LOS: 2 days   [facility] Pager on [email or web address]  03/21/2021, 8:12 AM

## 2021-03-22 ENCOUNTER — Encounter (HOSPITAL_COMMUNITY): Payer: Self-pay | Admitting: Emergency Medicine

## 2021-03-22 ENCOUNTER — Inpatient Hospital Stay (HOSPITAL_COMMUNITY): Payer: Medicare PPO

## 2021-03-22 ENCOUNTER — Inpatient Hospital Stay (HOSPITAL_COMMUNITY): Payer: Medicare PPO | Admitting: Anesthesiology

## 2021-03-22 ENCOUNTER — Encounter (HOSPITAL_COMMUNITY)
Admission: EM | Disposition: A | Discharge status: Skilled Nursing Facility | Payer: Self-pay | Source: Skilled Nursing Facility | Attending: Internal Medicine

## 2021-03-22 DIAGNOSIS — K8030 Calculus of bile duct with cholangitis, unspecified, without obstruction: Secondary | ICD-10-CM

## 2021-03-22 DIAGNOSIS — K838 Other specified diseases of biliary tract: Secondary | ICD-10-CM

## 2021-03-22 HISTORY — PX: SPHINCTEROTOMY: SHX5279

## 2021-03-22 HISTORY — PX: ERCP: SHX5425

## 2021-03-22 LAB — CBC
HCT: 36.8 % (ref 36.0–46.0)
Hemoglobin: 11.7 g/dL — ABNORMAL LOW (ref 12.0–15.0)
MCH: 30.4 pg (ref 26.0–34.0)
MCHC: 31.8 g/dL (ref 30.0–36.0)
MCV: 95.6 fL (ref 80.0–100.0)
Platelets: 123 10*3/uL — ABNORMAL LOW (ref 150–400)
RBC: 3.85 MIL/uL — ABNORMAL LOW (ref 3.87–5.11)
RDW: 14.4 % (ref 11.5–15.5)
WBC: 6.3 10*3/uL (ref 4.0–10.5)
nRBC: 0 % (ref 0.0–0.2)

## 2021-03-22 LAB — GLUCOSE, CAPILLARY
Glucose-Capillary: 93 mg/dL (ref 70–99)
Glucose-Capillary: 90 mg/dL (ref 70–99)
Glucose-Capillary: 119 mg/dL — ABNORMAL HIGH (ref 70–99)
Glucose-Capillary: 123 mg/dL — ABNORMAL HIGH (ref 70–99)

## 2021-03-22 LAB — COMPREHENSIVE METABOLIC PANEL
ALT: 69 U/L — ABNORMAL HIGH (ref 0–44)
AST: 33 U/L (ref 15–41)
Albumin: 3 g/dL — ABNORMAL LOW (ref 3.5–5.0)
Alkaline Phosphatase: 101 U/L (ref 38–126)
Anion gap: 7 (ref 5–15)
BUN: 15 mg/dL (ref 8–23)
CO2: 23 mmol/L (ref 22–32)
Calcium: 8.3 mg/dL — ABNORMAL LOW (ref 8.9–10.3)
Chloride: 108 mmol/L (ref 98–111)
Creatinine, Ser: 0.83 mg/dL (ref 0.44–1.00)
GFR, Estimated: >60 mL/min (ref >60)
Glucose, Bld: 96 mg/dL (ref 70–99)
Potassium: 4.1 mmol/L (ref 3.5–5.1)
Sodium: 138 mmol/L (ref 135–145)
Total Bilirubin: 0.9 mg/dL (ref 0.3–1.2)
Total Protein: 5.3 g/dL — ABNORMAL LOW (ref 6.5–8.1)

## 2021-03-22 SURGERY — ERCP, WITH INTERVENTION IF INDICATED
Anesthesia: General

## 2021-03-22 MED ORDER — ACETAMINOPHEN 10 MG/ML IV SOLN
INTRAVENOUS | Status: AC
  Filled 2021-03-22: qty 100

## 2021-03-22 MED ORDER — SODIUM CHLORIDE 0.9 % IV SOLN
INTRAVENOUS | Status: DC | PRN
  Administered 2021-03-22: 20 mL

## 2021-03-22 MED ORDER — SODIUM CHLORIDE FLUSH 0.9 % IV SOLN
INTRAVENOUS | Status: AC
  Filled 2021-03-22: qty 20

## 2021-03-22 MED ORDER — SUCCINYLCHOLINE CHLORIDE 200 MG/10ML IV SOSY
PREFILLED_SYRINGE | INTRAVENOUS | Status: DC | PRN
  Administered 2021-03-22: 100 mg via INTRAVENOUS

## 2021-03-22 MED ORDER — FENTANYL CITRATE PF 50 MCG/ML IJ SOSY
25.0000 ug | PREFILLED_SYRINGE | INTRAMUSCULAR | Status: DC | PRN
  Administered 2021-03-22: 25 ug via INTRAVENOUS
  Filled 2021-03-22: qty 1

## 2021-03-22 MED ORDER — GLUCAGON HCL RDNA (DIAGNOSTIC) 1 MG IJ SOLR
INTRAMUSCULAR | Status: DC | PRN
  Administered 2021-03-22 (×2): .25 mg via INTRAVENOUS

## 2021-03-22 MED ORDER — LACTATED RINGERS IV SOLN: INTRAVENOUS | Status: DC | PRN

## 2021-03-22 MED ORDER — SODIUM CHLORIDE 0.9 % IV SOLN: INTRAVENOUS | Status: DC

## 2021-03-22 MED ORDER — IPRATROPIUM-ALBUTEROL 0.5-2.5 (3) MG/3ML IN SOLN
RESPIRATORY_TRACT | Status: AC
  Filled 2021-03-22: qty 3

## 2021-03-22 MED ORDER — IPRATROPIUM-ALBUTEROL 0.5-2.5 (3) MG/3ML IN SOLN
3.0000 mL | Freq: Once | RESPIRATORY_TRACT | Status: AC
  Administered 2021-03-22: 3 mL via RESPIRATORY_TRACT

## 2021-03-22 MED ORDER — FENTANYL CITRATE (PF) 100 MCG/2ML IJ SOLN
INTRAMUSCULAR | Status: AC
  Filled 2021-03-22: qty 2

## 2021-03-22 MED ORDER — SODIUM CHLORIDE 0.9 % IV SOLN
INTRAVENOUS | Status: AC
  Filled 2021-03-22: qty 100

## 2021-03-22 MED ORDER — GLYCOPYRROLATE 0.2 MG/ML IJ SOLN
INTRAMUSCULAR | Status: DC | PRN
  Administered 2021-03-22: .1 mg via INTRAVENOUS

## 2021-03-22 MED ORDER — LIDOCAINE HCL (CARDIAC) PF 50 MG/5ML IV SOSY
PREFILLED_SYRINGE | INTRAVENOUS | Status: DC | PRN
  Administered 2021-03-22: 20 mg via INTRAVENOUS

## 2021-03-22 MED ORDER — GLUCAGON HCL RDNA (DIAGNOSTIC) 1 MG IJ SOLR
INTRAMUSCULAR | Status: AC
  Filled 2021-03-22: qty 2

## 2021-03-22 MED ORDER — PHENYLEPHRINE HCL-NACL 20-0.9 MG/250ML-% IV SOLN
INTRAVENOUS | Status: AC
  Filled 2021-03-22: qty 250

## 2021-03-22 MED ORDER — ACETAMINOPHEN 10 MG/ML IV SOLN
1000.0000 mg | Freq: Once | INTRAVENOUS | Status: AC
  Administered 2021-03-22: 1000 mg via INTRAVENOUS

## 2021-03-22 MED ORDER — KETOROLAC TROMETHAMINE 15 MG/ML IJ SOLN
INTRAMUSCULAR | Status: AC
  Filled 2021-03-22: qty 1

## 2021-03-22 MED ORDER — KETOROLAC TROMETHAMINE 15 MG/ML IJ SOLN
15.0000 mg | Freq: Once | INTRAMUSCULAR | Status: DC
  Administered 2021-03-22: 15 mg via INTRAVENOUS

## 2021-03-22 MED ORDER — STERILE WATER FOR IRRIGATION IR SOLN
Status: DC | PRN
  Administered 2021-03-22: 1000 mL

## 2021-03-22 MED ORDER — PROPOFOL 10 MG/ML IV BOLUS
INTRAVENOUS | Status: DC | PRN
  Administered 2021-03-22: 70 mg via INTRAVENOUS

## 2021-03-22 SURGICAL SUPPLY — 25 items
BALLN RETRIEVAL 12X15 (BALLOONS) IMPLANT
BALN RTRVL 200 6-7FR 12-15 (BALLOONS)
BASKET TRAPEZOID 3X6 (MISCELLANEOUS) IMPLANT
BASKET TRAPEZOID LITHO 2.0X5 (MISCELLANEOUS) ×1 IMPLANT
BSKT STON RTRVL TRAPEZOID 2X5 (MISCELLANEOUS)
BSKT STON RTRVL TRAPEZOID 3X6 (MISCELLANEOUS)
DEVICE INFLATION ENCORE 26 (MISCELLANEOUS) IMPLANT
DEVICE LOCKING W-BIOPSY CAP (MISCELLANEOUS) ×2 IMPLANT
GUIDEWIRE HYDRA JAGWIRE .35 (WIRE) IMPLANT
GUIDEWIRE JAG HINI 025X260CM (WIRE) IMPLANT
KIT ENDO PROCEDURE PEN (KITS) ×2 IMPLANT
KIT TURNOVER KIT A (KITS) ×2 IMPLANT
LUBRICANT JELLY 4.5OZ STERILE (MISCELLANEOUS) IMPLANT
PAD ARMBOARD 7.5X6 YLW CONV (MISCELLANEOUS) ×2 IMPLANT
POSITIONER HEAD 8X9X4 ADT (SOFTGOODS) IMPLANT
SCOPE SPY DS DISPOSABLE (MISCELLANEOUS) ×1 IMPLANT
SNARE ROTATE MED OVAL 20MM (MISCELLANEOUS) IMPLANT
SNARE SHORT THROW 13M SML OVAL (MISCELLANEOUS) IMPLANT
SPHINCTEROTOME AUTOTOME .25 (MISCELLANEOUS) IMPLANT
SPHINCTEROTOME HYDRATOME 44 (MISCELLANEOUS) ×1 IMPLANT
SYSTEM CONTINUOUS INJECTION (MISCELLANEOUS) ×1 IMPLANT
TUBING INSUFFLATOR CO2MPACT (TUBING) ×1 IMPLANT
WALLSTENT METAL COVERED 10X60 (STENTS) IMPLANT
WALLSTENT METAL COVERED 10X80 (STENTS) IMPLANT
WATER STERILE IRR 1000ML POUR (IV SOLUTION) ×2 IMPLANT

## 2021-03-22 NOTE — Op Note (Signed)
[facility] Patient Name: Susan Glass Procedure Date: 03/22/2021 9:17 AM MRN: [id number] Date of Birth: [birth year] Attending MD: Hildred Laser , MD CSN: [id number] Age: 87 Admit Type: Inpatient Procedure:                ERCP Indications:              Ascending cholangitis, For therapy of ascending                            cholangitis Providers:                Hildred Laser, MD, Gwenlyn Fudge, RN, Randa Spike, Technician Referring MD:             Bonnielee Haff, MD Medicines:                General Anesthesia Complications:            No immediate complications. Estimated Blood Loss:     Estimated blood loss was minimal. Procedure:                Pre-Anesthesia Assessment:                           - Prior to the procedure, a History and Physical                            was performed, and patient medications and                            allergies were reviewed. The patient's tolerance of                            previous anesthesia was also reviewed. The risks                            and benefits of the procedure and the sedation                            options and risks were discussed with the patient.                            All questions were answered, and informed consent                            was obtained. Prior Anticoagulants: The patient has                            taken no previous anticoagulant or antiplatelet                            agents. ASA Grade Assessment: III - A patient with                            severe systemic  disease. After reviewing the risks                            and benefits, the patient was deemed in                            satisfactory condition to undergo the procedure.                           After obtaining informed consent, the scope was                            passed under direct vision. Throughout the                            procedure, the patient's blood pressure,  pulse, and                            oxygen saturations were monitored continuously. The                            [employer] single use                            duodenoscope was introduced through the mouth, and                            used to inject contrast into and used to cannulate                            the bile duct. The ERCP was accomplished without                            difficulty. The patient tolerated the procedure                            well. Scope In: 10:14:56 AM Scope Out: 10:28:52 AM Total Procedure Duration: 0 hours 13 minutes 56 seconds  Findings:      A scout film of the abdomen was obtained. Surgical clips, consistent       with a previous cholecystectomy, were seen in the area of the right       upper quadrant of the abdomen. The esophagus was successfully intubated       under direct vision. The scope was advanced to a normal major papilla in       the descending duodenum without detailed examination of the pharynx,       larynx and associated structures, and upper GI tract. The upper GI tract       was grossly normal. erosion noted at ampullary orifice, The major       papilla contained a benign appearing stenosis. The bile duct was deeply       cannulated with the Hydratome sphincterotome. Contrast was injected. I       personally interpreted the bile duct images. There was brisk flow of       contrast through  the ducts. Image quality was excellent. Contrast       extended to the main bile duct. Contrast extended to the entire biliary       tree. The biliary sphincterotomy was extended to a total of 7 mm in       length with a braided traction (standard) sphincterotome using ERBE       electrocautery. The sphincterotomy oozed blood. As a sphincterotomy was       completed multiple tiny black stones measuring 1 to 2 mm came out       spontaneously. Drainage was excellent. Impression:               - The major papilla appeared  to be stenotic with                            small erosion.                           - A biliary sphincterotomy was performed.                           - Multiple tiny black stones came out spontaneously                            as contrast gushed into the duodenum. Moderate Sedation:      Per Anesthesia Care Recommendation:           - Avoid aspirin and anticoagulants for 3 days.                           - Return patient to hospital ward for ongoing care.                           - Clear liquid diet today. Diet will be advanced to                            heart healthy diet tomorrow.                           - Continue present medications.                           - Repeat lab in AM.                           - Continue antibiotics for another 24 hours. Procedure Code(s):        --- Professional ---                           [phone], Endoscopic retrograde                            cholangiopancreatography (ERCP); with                            sphincterotomy/papillotomy Diagnosis Code(s):        --- Professional ---  K80.30, Calculus of bile duct with cholangitis,                            unspecified, without obstruction                           K83.09, Other cholangitis                           K83.8, Other specified diseases of biliary tract CPT copyright 2019 American Medical Association. All rights reserved. The codes documented in this report are preliminary and upon coder review may  be revised to meet current compliance requirements. Hildred Laser, MD Hildred Laser, MD 03/22/2021 11:14:52 AM This report has been signed electronically. Number of Addenda: 0

## 2021-03-22 NOTE — Progress Notes (Addendum)
Tele called stating patient had a 3 second pause with a hr in the high 30s and low 40s. Patient is asleep. MD [place] Pink notified. MD [place] Pink stated to wake patient to see what HR is. HR with patient awake is in between 55-65. MD notified.

## 2021-03-22 NOTE — Transfer of Care (Signed)
Immediate Anesthesia Transfer of Care Note  Patient: Susan Glass  Procedure(s) Performed: ENDOSCOPIC RETROGRADE CHOLANGIOPANCREATOGRAPHY (ERCP) SPHINCTEROTOMY  Patient Location: PACU  Anesthesia Type:General  Level of Consciousness: awake and patient cooperative  Airway & Oxygen Therapy: Patient Spontanous Breathing and non-rebreather face mask  Post-op Assessment: Report given to RN and Post -op Vital signs reviewed and stable  Post vital signs: Reviewed and stable  Last Vitals:  Vitals Value Taken Time  BP 149/83 03/22/21 1045  Temp    Pulse 79 03/22/21 1047  Resp 20 03/22/21 1047  SpO2 100 % 03/22/21 1047  Vitals shown include unvalidated device data.  Last Pain:  Vitals:   03/22/21 0900  TempSrc:   PainSc: 0-No pain         Complications: No notable events documented.

## 2021-03-22 NOTE — Anesthesia Procedure Notes (Signed)
Procedure Name: Intubation Date/Time: 03/22/2021 9:47 AM Performed by: Vista Deck, CRNA Pre-anesthesia Checklist: Patient identified, Patient being monitored, Timeout performed, Emergency Drugs available and Suction available Patient Re-evaluated:Patient Re-evaluated prior to induction Oxygen Delivery Method: Circle System Utilized Preoxygenation: Pre-oxygenation with 100% oxygen Induction Type: IV induction Laryngoscope Size: Mac and 3 Grade View: Grade I Tube type: Oral Tube size: 6.5 mm Number of attempts: 1 Airway Equipment and Method: stylet Placement Confirmation: ETT inserted through vocal cords under direct vision, positive ETCO2 and breath sounds checked- equal and bilateral Secured at: 21 cm Tube secured with: Tape Dental Injury: Teeth and Oropharynx as per pre-operative assessment

## 2021-03-22 NOTE — Progress Notes (Signed)
[facility] PROGRESS NOTE   Susan Glass [id number] DOB: 1917-01-11 DOA: 03/18/2021  PCP: Sinda Du, MD  Brief History/Interval Summary: 87 y.o. female with medical history significant of CAD, chronic diastolic heart failure, GERD, history of upper GI bleed, hiatal hernia, hyperlipidemia, hypertension, iron deficiency anemia who was sent from her facility [facility]) due to hypoxia in the 78s, the patient also stated she has been having postprandial pain associated with nausea for several days.  She has been constipated.  Evaluation in the ED was concerning for abnormal LFTs with dilated ducts noted on CT scan.  She was hospitalized for further management.    Consultants: Gastroenterology  Procedures:  ERCP Impression:               - The major papilla appeared to be stenotic with                           small erosion.                           - A biliary sphincterotomy was performed.                           - Multiple tiny black stones came out spontaneously                           as contrast gushed into the duodenum.   Antibiotics: Anti-infectives (From admission, onward)    Start     Dose/Rate Route Frequency Ordered Stop   03/19/21 0800  cefTRIAXone (ROCEPHIN) 2 g in sodium chloride 0.9 % 100 mL IVPB        2 g 200 mL/hr over 30 Minutes Intravenous Every 24 hours 03/19/21 0456     03/19/21 0800  metroNIDAZOLE (FLAGYL) IVPB 500 mg        500 mg 100 mL/hr over 60 Minutes Intravenous Every 8 hours 03/19/21 0456     03/19/21 0200  piperacillin-tazobactam (ZOSYN) IVPB 3.375 g        3.375 g 100 mL/hr over 30 Minutes Intravenous  Once 03/19/21 0156 03/19/21 0243       Subjective/Interval History: Patient seen after her ERCP.  She was still sedated.  Denied any pain.     Assessment/Plan:  Concern for acute cholangitis/abnormal LFTs Patient's her WBC was 12.0.  The UA did not suggest infection.  CT scan did showed dilated bile ducts.  Even though she  was not particularly tender in the right upper quadrant, considering her dilated ducts, concern was for acute cholangitis. Gastroenterology is following.  ERCP was planned for 8/6 however due to her respiratory status it had to be postponed.  Patient was placed on ceftriaxone and metronidazole.  LFTs had been improving.   She is status postcholecystectomy.  Right upper quadrant ultrasound does show dilated common bile duct.  Hepatitis panel is unremarkable.   Patient underwent ERCP today which showed major papula to be stenotic with small erosion.  Sphincterotomy was performed.  Multiple tiny black stones subsequently came out spontaneously.  GI recommends antibiotics for another 24 hours.  Dyspnea Chest x-ray did not show any new findings.  Echocardiogram shows normal systolic function.  There is definitely chronic element to her symptoms.  Has not required oxygen.   Constipation Relieved with laxatives.  TSH was 0.64.  Free T4  1.5.  Unspecified atrial fibrillation Not on anticoagulation.  Rate is controlled with no medications.  Essential hypertension Blood pressure reasonably well controlled.  Continue to monitor for now.  History of coronary artery disease She is on aspirin and nitrates at home.  These can be resumed gradually.  No anginal symptoms currently.  History of GERD Continue famotidine.  Chronic kidney disease stage IIIa Renal function is stable.  Monitor urine output.  Avoid nephrotoxic agents.  Mild thrombocytopenia Platelet counts are stable.  [id number] was [id number] and folic acid noted to be 18.1.      DVT Prophylaxis: Lovenox Code Status: DNR Family Communication: No family at bedside Disposition Plan: Anticipate discharge back to SNF tomorrow.  Status is: Inpatient  Remains inpatient appropriate because:Ongoing diagnostic testing needed not appropriate for outpatient work up and IV treatments appropriate due to intensity of illness or inability to take PO  Dispo: The  patient is from: SNF              Anticipated d/c is to: SNF              Patient currently is not medically stable to d/c.   Difficult to place patient No       Medications: Scheduled:  isosorbide mononitrate  15 mg Oral Daily   pantoprazole  40 mg Oral BID   polyethylene glycol  17 g Oral BID   rOPINIRole  2 mg Oral QHS   senna-docusate  2 tablet Oral BID   sodium chloride flush       tamsulosin  0.4 mg Oral Daily   Continuous:  cefTRIAXone (ROCEPHIN)  IV 2 g (03/21/21 0844)   metronidazole 500 mg (03/21/21 2334)   sodium chloride     [id number] **OR** acetaminophen, ondansetron **OR** ondansetron (ZOFRAN) IV   Objective:  Vital Signs  Vitals:   03/22/21 1115 03/22/21 1130 03/22/21 1145 03/22/21 1208  BP: 137/69 (!) 160/88 (!) 144/88 (!) 157/87  Pulse: 79 77 (!) 54 94  Resp: (!) 22 (!) '27 15 15  '$ Temp:    (!) 97.5 F (36.4 C)  TempSrc:      SpO2: 96% 95% 91% 97%  Weight:      Height:        Intake/Output Summary (Last 24 hours) at 03/22/2021 1218 Last data filed at 03/22/2021 1046 Gross per 24 hour  Intake 620 ml  Output 0 ml  Net 620 ml    Filed Weights   03/19/21 2054  Weight: 64.3 kg    General appearance: Somnolent but easily arousable Resp: Clear to auscultation bilaterally.  Normal effort Cardio: S1-S2 is normal regular.  No S3-S4.  No rubs murmurs or bruit GI: Abdomen is soft.  Nontender nondistended.  Bowel sounds are present normal.  No masses organomegaly Extremities: No edema.   Neurologic: No focal neurological deficits.    Lab Results:  Data Reviewed: I have personally reviewed following labs and imaging studies  CBC: Recent Labs  Lab 03/18/21 1954 03/19/21 0357 03/20/21 0556 03/21/21 0558 03/22/21 0205  WBC 12.0* 12.7* 8.7 7.7 6.3  NEUTROABS 10.6* 11.2*  --   --   --   HGB 15.5* 13.3 12.7 12.1 11.7*  HCT 48.2* 41.3 39.6 37.6 36.8  MCV 94.7 93.4 94.7 95.2 95.6  PLT 154 133* 130* 134* 123*     Basic Metabolic  Panel: Recent Labs  Lab 03/18/21 1954 03/19/21 0357 03/20/21 0556 03/21/21 0558 03/22/21 0205  NA 136 137 140 135 138  K  5.0 4.7 4.5 4.3 4.1  CL 101 105 109 105 108  CO2 '26 24 24 24 23  '$ GLUCOSE 119* 120* 81 116* 96  BUN 30* 27* '19 16 15  '$ CREATININE 1.08* 0.99 0.97 0.85 0.83  CALCIUM 9.0 8.4* 8.5* 8.2* 8.3*     GFR: Estimated Creatinine Clearance: 28.6 mL/min (by C-G formula based on SCr of 0.83 mg/dL).  Liver Function Tests: Recent Labs  Lab 03/18/21 1954 03/19/21 0357 03/20/21 0556 03/21/21 0558 03/22/21 0205  AST 428* 257* 99* 50* 33  ALT 229* 220* 145* 95* 69*  ALKPHOS 163* 122 111 117 101  BILITOT 1.6* 1.5* 1.4* 0.9 0.9  PROT 7.6 5.9* 5.9* 5.6* 5.3*  ALBUMIN 4.5 3.4* 3.3* 3.1* 3.0*       Recent Results (from the past 240 hour(s))  Culture, blood (routine x 2)     Status: None (Preliminary result)   Collection Time: 03/18/21  8:28 PM   Specimen: BLOOD  Result Value Ref Range Status   Specimen Description BLOOD BLOOD RIGHT HAND  Final   Special Requests   Final    Blood Culture adequate volume BOTTLES DRAWN AEROBIC AND ANAEROBIC   Culture   Final    NO GROWTH 3 DAYS Performed at [facility], [address], [place], [place] [address]    Report Status PENDING  Incomplete  Culture, blood (routine x 2)     Status: None (Preliminary result)   Collection Time: 03/18/21  8:28 PM   Specimen: BLOOD  Result Value Ref Range Status   Specimen Description BLOOD BLOOD RIGHT FOREARM  Final   Special Requests   Final    Blood Culture adequate volume BOTTLES DRAWN AEROBIC AND ANAEROBIC   Culture   Final    NO GROWTH 3 DAYS Performed at [facility], [address] [place], [place] [address]    Report Status PENDING  Incomplete  Resp Panel by RT-PCR (Flu A&B, Covid) Nasopharyngeal Swab     Status: None   Collection Time: 03/18/21 11:10 PM   Specimen: Nasopharyngeal Swab; Nasopharyngeal(NP) swabs in vial transport medium  Result Value Ref Range Status   SARS  Coronavirus 2 by RT PCR NEGATIVE NEGATIVE Final    Comment: (NOTE) SARS-CoV-2 target nucleic acids are NOT DETECTED.  The SARS-CoV-2 RNA is generally detectable in upper respiratory specimens during the acute phase of infection. The lowest concentration of SARS-CoV-2 viral copies this assay can detect is 138 copies/mL. A negative result does not preclude SARS-Cov-2 infection and should not be used as the sole basis for treatment or other patient management decisions. A negative result may occur with  improper specimen collection/handling, submission of specimen other than nasopharyngeal swab, presence of viral mutation(s) within the areas targeted by this assay, and inadequate number of viral copies(<138 copies/mL). A negative result must be combined with clinical observations, patient history, and epidemiological information. The expected result is Negative.  Fact Sheet for Patients:  EntrepreneurPulse.com.au  Fact Sheet for Healthcare Providers:  IncredibleEmployment.be  This test is no t yet approved or cleared by the [place] FDA and  has been authorized for detection and/or diagnosis of SARS-CoV-2 by FDA under an Emergency Use Authorization (EUA). This EUA will remain  in effect (meaning this test can be used) for the duration of the COVID-19 declaration under Section 564(b)(1) of the Act, 21 U.S.C.section 360bbb-3(b)(1), unless the authorization is terminated  or revoked sooner.       Influenza A by PCR NEGATIVE NEGATIVE Final  Influenza B by PCR NEGATIVE NEGATIVE Final    Comment: (NOTE) The Xpert Xpress SARS-CoV-2/FLU/RSV plus assay is intended as an aid in the diagnosis of influenza from Nasopharyngeal swab specimens and should not be used as a sole basis for treatment. Nasal washings and aspirates are unacceptable for Xpert Xpress SARS-CoV-2/FLU/RSV testing.  Fact Sheet for  Patients: EntrepreneurPulse.com.au  Fact Sheet for Healthcare Providers: IncredibleEmployment.be  This test is not yet approved or cleared by the [place] FDA and has been authorized for detection and/or diagnosis of SARS-CoV-2 by FDA under an Emergency Use Authorization (EUA). This EUA will remain in effect (meaning this test can be used) for the duration of the COVID-19 declaration under Section 564(b)(1) of the Act, 21 U.S.C. section 360bbb-3(b)(1), unless the authorization is terminated or revoked.  Performed at [facility], [address] [place], [place] [address]        Radiology Studies: ECHOCARDIOGRAM COMPLETE  Result Date: 03/20/2021    ECHOCARDIOGRAM REPORT   Patient Name:   Susan Glass Date of Exam: 03/20/2021 Medical Rec #:  [id number]      Height:       60.0 in Accession #:    [id number]     Weight:       141.8 lb Date of Birth:  [birth year]       BSA:          1.613 m Patient Age:    89 years      BP:           153/96 mmHg Patient Gender: F              HR:           68 bpm. Exam Location:  [facility] Procedure: 2D Echo, Cardiac Doppler and Color Doppler Indications:    Congestive Heart Failure I50.9  History:        Patient has prior history of Echocardiogram examinations, most                 recent 10/08/2008. CHF, CAD; Risk Factors:Hypertension and                 Dyslipidemia. CKD (chronic kidney disease) stage 3.  Sonographer:    Alvino Chapel RCS Referring Phys: [place]  1. Left ventricular ejection fraction, by estimation, is 50 to 55%. The left ventricle has low normal function. There is mild left ventricular hypertrophy. Left ventricular diastolic parameters are indeterminate. Elevated left atrial pressure.  2. Right ventricular systolic function is normal. The right ventricular size is normal. There is normal pulmonary artery systolic pressure.  3. Left atrial size was severely dilated.  4. Right atrial  size was moderately dilated.  5. Mild mitral valve regurgitation.  6. The aortic valve is abnormal. Aortic valve regurgitation is mild. Mild aortic valve sclerosis is present, with no evidence of aortic valve stenosis. FINDINGS  Left Ventricle: Left ventricular ejection fraction, by estimation, is 50 to 55%. The left ventricle has low normal function. The left ventricular internal cavity size was normal in size. There is mild left ventricular hypertrophy. Left ventricular diastolic parameters are indeterminate. Elevated left atrial pressure. Right Ventricle: The right ventricular size is normal. Right vetricular wall thickness was not assessed. Right ventricular systolic function is normal. There is normal pulmonary artery systolic pressure. The tricuspid regurgitant velocity is 2.85 m/s, and with an assumed right atrial pressure of 3 mmHg, the estimated right ventricular systolic pressure is [id number] mmHg.  Left Atrium: Left atrial size was severely dilated. Right Atrium: Right atrial size was moderately dilated. Pericardium: There is no evidence of pericardial effusion. Mitral Valve: There is mild thickening of the mitral valve leaflet(s). Mild mitral valve regurgitation. Tricuspid Valve: The tricuspid valve is normal in structure. Tricuspid valve regurgitation is trivial. Aortic Valve: The aortic valve is abnormal. Aortic valve regurgitation is mild. Mild aortic valve sclerosis is present, with no evidence of aortic valve stenosis. Pulmonic Valve: The pulmonic valve was normal in structure. Pulmonic valve regurgitation is mild. Aorta: The aortic root and ascending aorta are structurally normal, with no evidence of dilitation. IAS/Shunts: No atrial level shunt detected by color flow Doppler.  LEFT VENTRICLE PLAX 2D LVIDd:         4.30 cm  Diastology LVIDs:         3.00 cm  LV e' medial:    5.44 cm/s LV PW:         1.20 cm  LV E/e' medial:  21.1 LV IVS:        1.20 cm  LV e' lateral:   9.03 cm/s LVOT diam:     2.10 cm   LV E/e' lateral: 12.7 LV SV:         52 LV SV Index:   32 LVOT Area:     3.46 cm  RIGHT VENTRICLE RV S prime:     10.60 cm/s TAPSE (M-mode): 1.8 cm LEFT ATRIUM              Index       RIGHT ATRIUM           Index LA diam:        3.97 cm  2.46 cm/m  RA Area:     21.30 cm LA Vol (A2C):   94.2 ml  58.42 ml/m RA Volume:   63.60 ml  39.44 ml/m LA Vol (A4C):   103.0 ml 63.87 ml/m LA Biplane Vol: 99.5 ml  61.70 ml/m  AORTIC VALVE LVOT Vmax:   77.70 cm/s LVOT Vmean:  47.500 cm/s LVOT VTI:    0.150 m  AORTA Ao Root diam: 3.10 cm MITRAL VALVE                TRICUSPID VALVE MV Area (PHT): 5.50 cm     TR Peak grad:   32.5 mmHg MV Decel Time: 138 msec     TR Vmax:        285.00 cm/s MV E velocity: 115.00 cm/s MV A velocity: 50.10 cm/s   SHUNTS MV E/A ratio:  2.30         Systemic VTI:  0.15 m                             Systemic Diam: 2.10 cm Dorris Carnes MD Electronically signed by Dorris Carnes MD Signature Date/Time: 03/20/2021/2:45:23 PM    Final        LOS: 3 days   [facility] Pager on [email or web address]  03/22/2021, 12:18 PM

## 2021-03-22 NOTE — Progress Notes (Signed)
Subjective:  Patient has no complaints.  She denies chest pain shortness of breath or abdominal pain.  Current Medications:  Current Facility-Administered Medications:    0.9 %  sodium chloride infusion, , Intravenous, Continuous, Salih Williamson, Mechele Dawley, MD   [MAR Hold] acetaminophen (TYLENOL) tablet 650 mg, 650 mg, Oral, Q6H PRN **OR** [MAR Hold] acetaminophen (TYLENOL) suppository 650 mg, 650 mg, Rectal, Q6H PRN, Reubin Milan, MD   [facility] Hold] cefTRIAXone (ROCEPHIN) 2 g in sodium chloride 0.9 % 100 mL IVPB, 2 g, Intravenous, Q24H, Reubin Milan, MD, Last Rate: 200 mL/hr at 03/21/21 0844, 2 g at 03/21/21 0844   glucagon (human recombinant) (GLUCAGEN) 1 MG injection, , , ,    [MAR Hold] isosorbide mononitrate (IMDUR) 24 hr tablet 15 mg, 15 mg, Oral, Daily, Bonnielee Haff, MD, 15 mg at 03/21/21 0834   [MAR Hold] metroNIDAZOLE (FLAGYL) IVPB 500 mg, 500 mg, Intravenous, Q8H, Reubin Milan, MD, Last Rate: 100 mL/hr at 03/21/21 2334, 500 mg at 03/21/21 2334   [MAR Hold] ondansetron (ZOFRAN) tablet 4 mg, 4 mg, Oral, Q6H PRN **OR** [MAR Hold] ondansetron (ZOFRAN) injection 4 mg, 4 mg, Intravenous, Q6H PRN, Reubin Milan, MD, 4 mg at 03/21/21 2136   [MAR Hold] pantoprazole (PROTONIX) EC tablet 40 mg, 40 mg, Oral, BID, Harper, Kristen S, PA-C, 40 mg at 03/21/21 2131   [facility] Hold] polyethylene glycol (MIRALAX / GLYCOLAX) packet 17 g, 17 g, Oral, BID, Bonnielee Haff, MD, 17 g at 03/21/21 2130   [MAR Hold] rOPINIRole (REQUIP) tablet 2 mg, 2 mg, Oral, QHS, Bonnielee Haff, MD, 2 mg at 03/21/21 2131   [facility] Hold] senna-docusate (Senokot-S) tablet 2 tablet, 2 tablet, Oral, BID, Bonnielee Haff, MD, 2 tablet at 03/21/21 2131   sodium chloride 0.9 % infusion, , , ,    sodium chloride flush 0.9 % injection, , , ,    [MAR Hold] tamsulosin (FLOMAX) capsule 0.4 mg, 0.4 mg, Oral, Daily, Bonnielee Haff, MD, 0.4 mg at 03/21/21 2694   Objective: Blood pressure (!) 150/85, pulse 68, temperature  97.6 F (36.4 C), temperature source Oral, resp. rate 18, height 5' (1.524 m), weight 64.3 kg, SpO2 95 %. Patient is alert and in no acute distress. She has hearing impairment. Sclera is nonicteric. Oropharyngeal mucosa is normal. No neck masses or thyromegaly noted. Cardiac exam with regular rhythm normal S1 and S2.  No murmur gallop noted. Auscultation lungs reveal few crackles at bases. Abdomen is soft and nontender with organomegaly or masses. Trace edema around ankles.  Labs/studies Results:   CBC Latest Ref Rng & Units 03/22/2021 03/21/2021 03/20/2021  WBC 4.0 - 10.5 K/uL 6.3 7.7 8.7  Hemoglobin 12.0 - 15.0 g/dL 11.7(L) 12.1 12.7  Hematocrit 36.0 - 46.0 % 36.8 37.6 39.6  Platelets 150 - 400 K/uL 123(L) 134(L) 130(L)    CMP Latest Ref Rng & Units 03/22/2021 03/21/2021 03/20/2021  Glucose 70 - 99 mg/dL 96 116(H) 81  BUN 8 - 23 mg/dL _0 Creatinine 0.44 - 1.00 mg/dL 0.83 0.85 0.97  Sodium 135 - 145 mmol/L 138 135 140  Potassium 3.5 - 5.1 mmol/L 4.1 4.3 4.5  Chloride 98 - 111 mmol/L 108 105 109  CO2 22 - 32 mmol/L _1 Calcium 8.9 - 10.3 mg/dL 8.3(L) 8.2(L) 8.5(L)  Total Protein 6.5 - 8.1 g/dL 5.3(L) 5.6(L) 5.9(L)  Total Bilirubin 0.3 - 1.2 mg/dL 0.9 0.9 1.4(H)  Alkaline Phos 38 - 126 U/L 101 117 111  AST 15 -  41 U/L 33 50(H) 99(H)  ALT 0 - 44 U/L 69(H) 95(H) 145(H)    Hepatic Function Latest Ref Rng & Units 03/22/2021 03/21/2021 03/20/2021  Total Protein 6.5 - 8.1 g/dL 5.3(L) 5.6(L) 5.9(L)  Albumin 3.5 - 5.0 g/dL 3.0(L) 3.1(L) 3.3(L)  AST 15 - 41 U/L 33 50(H) 99(H)  ALT 0 - 44 U/L 69(H) 95(H) 145(H)  Alk Phosphatase 38 - 126 U/L 101 117 111  Total Bilirubin 0.3 - 1.2 mg/dL 0.9 0.9 1.4(H)    Assessment:  #1.  Patient presented with acute cholangitis.  She had right upper quadrant abdominal pain fever chills and elevated bilirubin and transaminases.  Imaging studies revealed markedly dilated extra and intrahepatic biliary radicles without obvious stone.  She has responded to  antibiotics.  AST and bilirubin are now normal.  ALT is coming down. 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  Ampullary tumor less likely given presentation.  She could also have a small stone that she could have passed. Given markedly dilated biliary system risk of relapse is very high therefore ERCP with sphincterotomy plus minus stenting is warranted. Once again I reviewed procedure risks with patient's daughter-in-law [name] yesterday and she agreed.  #2.  Chronic atrial fibrillation with controlled ventricular rate.  Patient not an anticoagulant.  #3.  History of CHF.  Her EF was over 50%.  She has few crackles at the bases and I wonder if this is her baseline.   Plan:  ERCP with sphincterotomy and possible stenting if she has papillary stenosis or stricture.

## 2021-03-22 NOTE — Progress Notes (Signed)
Brief ERCP note  Normal limited view of upper GI tract. Ampulla Vater with tiny erosion and orifice. PD not cannulated or filled with contrast. Markedly dilated CBD and CHD without filling defects. Intrahepatic biliary radicles partially filled and were also dilated. Biliary sphincterotomy performed with spontaneous passage of multiple tiny black stones. Patient tolerated the procedure well.

## 2021-03-23 ENCOUNTER — Encounter (HOSPITAL_COMMUNITY): Payer: Self-pay | Admitting: Internal Medicine

## 2021-03-23 DIAGNOSIS — K8050 Calculus of bile duct without cholangitis or cholecystitis without obstruction: Secondary | ICD-10-CM

## 2021-03-23 LAB — COMPREHENSIVE METABOLIC PANEL
ALT: 61 U/L — ABNORMAL HIGH (ref 0–44)
AST: 34 U/L (ref 15–41)
Albumin: 3.2 g/dL — ABNORMAL LOW (ref 3.5–5.0)
Alkaline Phosphatase: 103 U/L (ref 38–126)
Anion gap: 7 (ref 5–15)
BUN: 16 mg/dL (ref 8–23)
CO2: 24 mmol/L (ref 22–32)
Calcium: 8.6 mg/dL — ABNORMAL LOW (ref 8.9–10.3)
Chloride: 103 mmol/L (ref 98–111)
Creatinine, Ser: 0.97 mg/dL (ref 0.44–1.00)
GFR, Estimated: 52 mL/min — ABNORMAL LOW (ref >60)
Glucose, Bld: 84 mg/dL (ref 70–99)
Potassium: 4.7 mmol/L (ref 3.5–5.1)
Sodium: 134 mmol/L — ABNORMAL LOW (ref 135–145)
Total Bilirubin: 0.8 mg/dL (ref 0.3–1.2)
Total Protein: 5.8 g/dL — ABNORMAL LOW (ref 6.5–8.1)

## 2021-03-23 LAB — GLUCOSE, CAPILLARY
Glucose-Capillary: 82 mg/dL (ref 70–99)
Glucose-Capillary: 87 mg/dL (ref 70–99)
Glucose-Capillary: 87 mg/dL (ref 70–99)
Glucose-Capillary: 146 mg/dL — ABNORMAL HIGH (ref 70–99)

## 2021-03-23 LAB — CBC
HCT: 39.9 % (ref 36.0–46.0)
Hemoglobin: 12.7 g/dL (ref 12.0–15.0)
MCH: 30.5 pg (ref 26.0–34.0)
MCHC: 31.8 g/dL (ref 30.0–36.0)
MCV: 95.7 fL (ref 80.0–100.0)
Platelets: 140 10*3/uL — ABNORMAL LOW (ref 150–400)
RBC: 4.17 MIL/uL (ref 3.87–5.11)
RDW: 14.1 % (ref 11.5–15.5)
WBC: 7.2 10*3/uL (ref 4.0–10.5)
nRBC: 0 % (ref 0.0–0.2)

## 2021-03-23 LAB — CULTURE, BLOOD (ROUTINE X 2)
Culture: NO GROWTH
Culture: NO GROWTH
Special Requests: ADEQUATE
Special Requests: ADEQUATE

## 2021-03-23 MED ORDER — POLYETHYLENE GLYCOL 3350 17 G PO PACK: 17.0000 g | PACK | Freq: Two times a day (BID) | ORAL | 0 refills | Status: AC

## 2021-03-23 MED ORDER — PHENOL 1.4 % MT LIQD: 1.0000 | OROMUCOSAL | 0 refills | Status: AC | PRN

## 2021-03-23 MED ORDER — SENNOSIDES-DOCUSATE SODIUM 8.6-50 MG PO TABS: 2.0000 | ORAL_TABLET | Freq: Two times a day (BID) | ORAL | Status: AC

## 2021-03-23 MED ORDER — PHENOL 1.4 % MT LIQD
1.0000 | OROMUCOSAL | Status: DC | PRN
  Filled 2021-03-23: qty 177

## 2021-03-23 NOTE — TOC Transition Note (Signed)
Transition of Care [facility]) - CM/SW Discharge Note   Patient Details  Name: Susan Glass MRN: [id number] Date of Birth: [birth year]  [facility]) CM/SW Contact:  Iona Beard, [place] Phone Number: 03/23/2021, 12:17 PM   Clinical Narrative:    Pt is ready for d/c to [place]. CSW spoke with Jackelyn Poling who states pt can return. Jackelyn Poling will update with room number. CSW left hippa compliant vm to let family know pt would be d/c back to prior facility.   Final next level of care: Long Term Nursing Home Barriers to Discharge: Barriers Resolved   Patient Goals and CMS Choice Patient states their goals for this hospitalization and ongoing recovery are:: Return to [email or web address].gov Compare Post Acute Care list provided to:: Patient Represenative (must comment) Choice offered to / list presented to : Adult Children  Discharge Placement              Patient chooses bed at: Other - please specify in the comment section below: ([name]) Patient to be transferred to facility by: EMS Name of family member notified: Cy Blamer (grandson) Left hippa compliant VM Patient and family notified of of transfer: 03/23/21  Discharge Plan and Services                                     Social Determinants of Health (SDOH) Interventions     Readmission Risk Interventions Readmission Risk Prevention Plan 03/23/2021  Medication Screening Complete  Transportation Screening Complete  Some recent data might be hidden

## 2021-03-23 NOTE — Discharge Summary (Addendum)
[facility]  Physician Discharge Summary   Patient ID: Susan Glass MRN: [id number] DOB/AGE: August 24, 1916 87 y.o.  Admit date: 03/18/2021 Discharge date:   03/23/2021   PCP: Sinda Du, MD  DISCHARGE DIAGNOSES:  Febrile illness, possibly due to biliary source Choledocholithiasis status post ERCP and biliary sphincterotomy Constipation Unspecified atrial fibrillation Essential hypertension History of coronary artery disease GERD Chronic kidney disease stage IIIa   RECOMMENDATIONS FOR OUTPATIENT FOLLOW UP: Check CBC and complete metabolic panel in 1 week Use Chloraseptic spray for throat irritation as needed   Home Health: Back to SNF Equipment/Devices: None  CODE STATUS: DNR  DISCHARGE CONDITION: fair  Diet recommendation: Heart healthy  INITIAL HISTORY: 87 y.o. female with medical history significant of CAD, chronic diastolic heart failure, GERD, history of upper GI bleed, hiatal hernia, hyperlipidemia, hypertension, iron deficiency anemia who was sent from her facility [facility]) due to hypoxia in the 60s, the patient also stated she has been having postprandial pain associated with nausea for several days.  She has been constipated.  Evaluation in the ED was concerning for abnormal LFTs with dilated ducts noted on CT scan.  She was hospitalized for further management.     Consultants: Gastroenterology   Procedures:  ERCP Impression:               - The major papilla appeared to be stenotic with                           small erosion.                           - A biliary sphincterotomy was performed.                           - Multiple tiny black stones came out spontaneously                           as contrast gushed into the duodenum.    HOSPITAL COURSE:   Choledocholithiasis Patient's WBC was 12.0.  UA did not suggest infection.  CT scan did showed dilated bile ducts.  Even though she was not particularly tender in the right upper quadrant,  considering her dilated ducts, concern was for acute cholangitis.  Patient was seen by gastroenterology.  She underwent echocardiogram which showed normal systolic function to characterize her risk prior to undergoing ERCP.  She was monitored for 2 days due to her tenuous respiratory status. She is status postcholecystectomy.  Right upper quadrant ultrasound does show dilated common bile duct.  Hepatitis panel is unremarkable.   Patient underwent ERCP which showed major papula to be stenotic with small erosion.  Sphincterotomy was performed.  Multiple tiny black stones subsequently came out spontaneously.  She has received antibiotics for a total of 5 days.  No need to continue.  Experiencing some throat discomfort this morning for which she will be prescribed Chloraseptic spray.   Dyspnea Chest x-ray did not show any new findings.  Echocardiogram shows normal systolic function.  There is definitely chronic element to her symptoms.  Has not required oxygen.  Remains stable   Constipation Relieved with laxatives.  TSH was 0.64.  Free T4 1.5.  Unspecified atrial fibrillation Not on anticoagulation.  On no rate control medications either.  Essential hypertension May resume her home medications  History of  coronary artery disease She is on aspirin and nitrates at home.  May resume her home medications  History of GERD Continue famotidine.  Chronic kidney disease stage IIIa Renal function is stable.   Mild thrombocytopenia Platelet counts are stable.  [id number] was [id number] and folic acid noted to be 18.1.       Overall stable.  Hopefully she can return to skilled nursing facility later today.   PERTINENT LABS:  The results of significant diagnostics from this hospitalization (including imaging, microbiology, ancillary and laboratory) are listed below for reference.    Microbiology: Recent Results (from the past 240 hour(s))  Culture, blood (routine x 2)     Status: None (Preliminary result)    Collection Time: 03/18/21  8:28 PM   Specimen: BLOOD  Result Value Ref Range Status   Specimen Description   Final    BLOOD BLOOD RIGHT HAND Performed at [facility], [address], [place], [place] [address]    Special Requests   Final    Blood Culture adequate volume BOTTLES DRAWN AEROBIC AND ANAEROBIC Performed at [facility], [address] [place], [place] [address]    Culture   Final    NO GROWTH 4 DAYS Performed at [facility] [address], [place], [place] [address]    Report Status PENDING  Incomplete  Culture, blood (routine x 2)     Status: None (Preliminary result)   Collection Time: 03/18/21  8:28 PM   Specimen: BLOOD  Result Value Ref Range Status   Specimen Description BLOOD BLOOD RIGHT FOREARM  Final   Special Requests   Final    Blood Culture adequate volume BOTTLES DRAWN AEROBIC AND ANAEROBIC   Culture   Final    NO GROWTH 3 DAYS Performed at [facility], [address] [place], [place] [address]    Report Status PENDING  Incomplete  Resp Panel by RT-PCR (Flu A&B, Covid) Nasopharyngeal Swab     Status: None   Collection Time: 03/18/21 11:10 PM   Specimen: Nasopharyngeal Swab; Nasopharyngeal(NP) swabs in vial transport medium  Result Value Ref Range Status   SARS Coronavirus 2 by RT PCR NEGATIVE NEGATIVE Final    Comment: (NOTE) SARS-CoV-2 target nucleic acids are NOT DETECTED.  The SARS-CoV-2 RNA is generally detectable in upper respiratory specimens during the acute phase of infection. The lowest concentration of SARS-CoV-2 viral copies this assay can detect is 138 copies/mL. A negative result does not preclude SARS-Cov-2 infection and should not be used as the sole basis for treatment or other patient management decisions. A negative result may occur with  improper specimen collection/handling, submission of specimen other than nasopharyngeal swab, presence of viral mutation(s) within the areas targeted by this assay, and inadequate  number of viral copies(<138 copies/mL). A negative result must be combined with clinical observations, patient history, and epidemiological information. The expected result is Negative.  Fact Sheet for Patients:  EntrepreneurPulse.com.au  Fact Sheet for Healthcare Providers:  IncredibleEmployment.be  This test is no t yet approved or cleared by the [place] FDA and  has been authorized for detection and/or diagnosis of SARS-CoV-2 by FDA under an Emergency Use Authorization (EUA). This EUA will remain  in effect (meaning this test can be used) for the duration of the COVID-19 declaration under Section 564(b)(1) of the Act, 21 U.S.C.section 360bbb-3(b)(1), unless the authorization is terminated  or revoked sooner.       Influenza A by PCR NEGATIVE NEGATIVE Final   Influenza B  by PCR NEGATIVE NEGATIVE Final    Comment: (NOTE) The Xpert Xpress SARS-CoV-2/FLU/RSV plus assay is intended as an aid in the diagnosis of influenza from Nasopharyngeal swab specimens and should not be used as a sole basis for treatment. Nasal washings and aspirates are unacceptable for Xpert Xpress SARS-CoV-2/FLU/RSV testing.  Fact Sheet for Patients: EntrepreneurPulse.com.au  Fact Sheet for Healthcare Providers: IncredibleEmployment.be  This test is not yet approved or cleared by the [place] FDA and has been authorized for detection and/or diagnosis of SARS-CoV-2 by FDA under an Emergency Use Authorization (EUA). This EUA will remain in effect (meaning this test can be used) for the duration of the COVID-19 declaration under Section 564(b)(1) of the Act, 21 U.S.C. section 360bbb-3(b)(1), unless the authorization is terminated or revoked.  Performed at [facility], [address] [place], [place] [address]      Labs:  COVID-19 Labs   Lab Results  Component Value Date   [place] 03/18/2021       Basic Metabolic Panel: Recent Labs  Lab 03/19/21 0357 03/20/21 0556 03/21/21 0558 03/22/21 0205 03/23/21 0356  NA 137 140 135 138 134*  K 4.7 4.5 4.3 4.1 4.7  CL 105 109 105 108 103  CO2 '24 24 24 23 24  '$ GLUCOSE 120* 81 116* 96 84  BUN 27* '19 16 15 16  '$ CREATININE 0.99 0.97 0.85 0.83 0.97  CALCIUM 8.4* 8.5* 8.2* 8.3* 8.6*   Liver Function Tests: Recent Labs  Lab 03/19/21 0357 03/20/21 0556 03/21/21 0558 03/22/21 0205 03/23/21 0356  AST 257* 99* 50* 33 34  ALT 220* 145* 95* 69* 61*  ALKPHOS 122 111 117 101 103  BILITOT 1.5* 1.4* 0.9 0.9 0.8  PROT 5.9* 5.9* 5.6* 5.3* 5.8*  ALBUMIN 3.4* 3.3* 3.1* 3.0* 3.2*    CBC: Recent Labs  Lab 03/18/21 1954 03/19/21 0357 03/20/21 0556 03/21/21 0558 03/22/21 0205 03/23/21 0356  WBC 12.0* 12.7* 8.7 7.7 6.3 7.2  NEUTROABS 10.6* 11.2*  --   --   --   --   HGB 15.5* 13.3 12.7 12.1 11.7* 12.7  HCT 48.2* 41.3 39.6 37.6 36.8 39.9  MCV 94.7 93.4 94.7 95.2 95.6 95.7  PLT 154 133* 130* 134* 123* 140*     CBG: Recent Labs  Lab 03/22/21 1819 03/23/21 0117 03/23/21 0606 03/23/21 0705 03/23/21 1107  GLUCAP 123* 82 87 87 146*     IMAGING STUDIES CT ABDOMEN PELVIS W CONTRAST  Result Date: 03/19/2021 CLINICAL DATA:  Abdominal abscess/infection. EXAM: CT ABDOMEN AND PELVIS WITH CONTRAST TECHNIQUE: Multidetector CT imaging of the abdomen and pelvis was performed using the standard protocol following bolus administration of intravenous contrast. CONTRAST:  25m OMNIPAQUE IOHEXOL 300 MG/ML SOLN, 352mOMNIPAQUE IOHEXOL 300 MG/ML SOLN COMPARISON:  None. FINDINGS: Lower chest: Diffuse interstitial coarsening. There is cardiomegaly. Coronary vascular calcification. No intra-abdominal free air or free fluid. Hepatobiliary: The liver is unremarkable. There is diffuse intrahepatic and extrahepatic biliary ductal dilatation. The common bile duct measures approximately 13 mm in diameter. No calcified stone noted in the central CBD.  Cholecystectomy. Pancreas: No active inflammatory changes. Spleen: Normal in size without focal abnormality. Adrenals/Urinary Tract: The left adrenal glands unremarkable. There is a 2 cm indeterminate right adrenal nodule versus adrenal thickening. There is no hydronephrosis on either side. There is symmetric enhancement and excretion of contrast by both kidneys. The visualized ureters and urinary bladder appear unremarkable. Stomach/Bowel: There is a moderate size hiatal hernia. Apparent mild thickening and edema of the wall  of the distal esophagus and herniated stomach. There is moderate stool throughout the colon. There is sigmoid diverticulosis without active inflammatory changes. Dense stool within the rectal vault may represent a degree of fecal impaction. There is no bowel obstruction. The appendix is not visualized with certainty. No inflammatory changes identified in the right lower quadrant. Vascular/Lymphatic: Advanced aortoiliac atherosclerotic disease. The IVC is unremarkable. No portal venous gas. There is no adenopathy. Reproductive: The uterus is anteverted and grossly unremarkable. Small calcified fibroid. No adnexal masses. Other: None Musculoskeletal: Osteopenia with degenerative changes of the spine. Old left pubic bone fractures with nonunion. No acute fracture. IMPRESSION: 1. Moderate size hiatal hernia with findings of esophagitis. 2. Sigmoid diverticulosis. No bowel obstruction. 3. Dense stool within the rectal vault may represent a degree of fecal impaction. 4. Diffuse intrahepatic and extrahepatic biliary ductal dilatation, status post prior cholecystectomy. No retained calcified stone noted in the central CBD. Correlation with liver function tests recommended. 5. Aortic Atherosclerosis (ICD10-I70.0). Electronically Signed   By: Anner Crete M.D.   On: 03/19/2021 01:52   DG CHEST PORT 1 VIEW  Result Date: 03/20/2021 CLINICAL DATA:  Shortness of breath EXAM: PORTABLE CHEST 1 VIEW  COMPARISON:  03/18/2021 and prior studies FINDINGS: Cardiomegaly again identified. Chronic interstitial opacities are again identified. There is no evidence of focal airspace disease, suspicious pulmonary nodule/mass, pleural effusion, or pneumothorax. No acute bony abnormalities are identified. Severe degenerative changes in both shoulders again noted. IMPRESSION: Cardiomegaly without evidence of acute cardiopulmonary disease. Electronically Signed   By: Margarette Canada M.D.   On: 03/20/2021 10:31   DG Chest Port 1 View  Result Date: 03/18/2021 CLINICAL DATA:  Fever EXAM: PORTABLE CHEST 1 VIEW COMPARISON:  12/27/2017 FINDINGS: Lung volumes are small, but appears symmetric. Chronic interstitial thickening again noted. No superimposed confluent pulmonary infiltrate. No pneumothorax or pleural effusion. Cardiac size is mildly enlarged, stable. Pulmonary vasculature is normal. Advanced degenerative changes are noted within the shoulders bilaterally. No acute bone abnormality. IMPRESSION: Pulmonary hypoinflation.  Stable cardiomegaly. Electronically Signed   By: Fidela Salisbury MD   On: 03/18/2021 20:48   DG ERCP BILIARY & PANCREATIC DUCTS  Result Date: 03/22/2021 CLINICAL DATA:  Biliary ductal dilatation post cholecystectomy EXAM: ERCP TECHNIQUE: Multiple cine images obtained with the fluoroscopic device and submitted for interpretation post-procedure. COMPARISON:  CT 03/19/2021 FINDINGS: A series of fluoroscopic images document endoscopic cannulation and opacification of the CBD. The CBD is dilated without focal lesion identified. No convincing filling defects. Cholecystectomy clips in place. No evidence of leak. Incomplete opacification of the intrahepatic biliary tree which appears mildly ectatic centrally. IMPRESSION: Intra and extrahepatic biliary ductal dilatation. These images were submitted for radiologic interpretation only. Please see the procedural report for the amount of contrast and the fluoroscopy time  utilized. Electronically Signed   By: Lucrezia Europe M.D.   On: 03/22/2021 12:25   ECHOCARDIOGRAM COMPLETE  Result Date: 03/20/2021    ECHOCARDIOGRAM REPORT   Patient Name:   Susan Glass Date of Exam: 03/20/2021 Medical Rec #:  [id number]      Height:       60.0 in Accession #:    [id number]     Weight:       141.8 lb Date of Birth:  1917-02-13       BSA:          1.613 m Patient Age:    89 years      BP:  153/96 mmHg Patient Gender: F              HR:           68 bpm. Exam Location:  [facility] Procedure: 2D Echo, Cardiac Doppler and Color Doppler Indications:    Congestive Heart Failure I50.9  History:        Patient has prior history of Echocardiogram examinations, most                 recent 10/08/2008. CHF, CAD; Risk Factors:Hypertension and                 Dyslipidemia. CKD (chronic kidney disease) stage 3.  Sonographer:    Alvino Chapel RCS Referring Phys: [place]  1. Left ventricular ejection fraction, by estimation, is 50 to 55%. The left ventricle has low normal function. There is mild left ventricular hypertrophy. Left ventricular diastolic parameters are indeterminate. Elevated left atrial pressure.  2. Right ventricular systolic function is normal. The right ventricular size is normal. There is normal pulmonary artery systolic pressure.  3. Left atrial size was severely dilated.  4. Right atrial size was moderately dilated.  5. Mild mitral valve regurgitation.  6. The aortic valve is abnormal. Aortic valve regurgitation is mild. Mild aortic valve sclerosis is present, with no evidence of aortic valve stenosis. FINDINGS  Left Ventricle: Left ventricular ejection fraction, by estimation, is 50 to 55%. The left ventricle has low normal function. The left ventricular internal cavity size was normal in size. There is mild left ventricular hypertrophy. Left ventricular diastolic parameters are indeterminate. Elevated left atrial pressure. Right Ventricle: The right ventricular  size is normal. Right vetricular wall thickness was not assessed. Right ventricular systolic function is normal. There is normal pulmonary artery systolic pressure. The tricuspid regurgitant velocity is 2.85 m/s, and with an assumed right atrial pressure of 3 mmHg, the estimated right ventricular systolic pressure is [id number] mmHg. Left Atrium: Left atrial size was severely dilated. Right Atrium: Right atrial size was moderately dilated. Pericardium: There is no evidence of pericardial effusion. Mitral Valve: There is mild thickening of the mitral valve leaflet(s). Mild mitral valve regurgitation. Tricuspid Valve: The tricuspid valve is normal in structure. Tricuspid valve regurgitation is trivial. Aortic Valve: The aortic valve is abnormal. Aortic valve regurgitation is mild. Mild aortic valve sclerosis is present, with no evidence of aortic valve stenosis. Pulmonic Valve: The pulmonic valve was normal in structure. Pulmonic valve regurgitation is mild. Aorta: The aortic root and ascending aorta are structurally normal, with no evidence of dilitation. IAS/Shunts: No atrial level shunt detected by color flow Doppler.  LEFT VENTRICLE PLAX 2D LVIDd:         4.30 cm  Diastology LVIDs:         3.00 cm  LV e' medial:    5.44 cm/s LV PW:         1.20 cm  LV E/e' medial:  21.1 LV IVS:        1.20 cm  LV e' lateral:   9.03 cm/s LVOT diam:     2.10 cm  LV E/e' lateral: 12.7 LV SV:         52 LV SV Index:   32 LVOT Area:     3.46 cm  RIGHT VENTRICLE RV S prime:     10.60 cm/s TAPSE (M-mode): 1.8 cm LEFT ATRIUM              Index  RIGHT ATRIUM           Index LA diam:        3.97 cm  2.46 cm/m  RA Area:     21.30 cm LA Vol (A2C):   94.2 ml  58.42 ml/m RA Volume:   63.60 ml  39.44 ml/m LA Vol (A4C):   103.0 ml 63.87 ml/m LA Biplane Vol: 99.5 ml  61.70 ml/m  AORTIC VALVE LVOT Vmax:   77.70 cm/s LVOT Vmean:  47.500 cm/s LVOT VTI:    0.150 m  AORTA Ao Root diam: 3.10 cm MITRAL VALVE                TRICUSPID VALVE MV Area  (PHT): 5.50 cm     TR Peak grad:   32.5 mmHg MV Decel Time: 138 msec     TR Vmax:        285.00 cm/s MV E velocity: 115.00 cm/s MV A velocity: 50.10 cm/s   SHUNTS MV E/A ratio:  2.30         Systemic VTI:  0.15 m                             Systemic Diam: 2.10 cm Dorris Carnes MD Electronically signed by Dorris Carnes MD Signature Date/Time: 03/20/2021/2:45:23 PM    Final    US Abdomen Limited RUQ (LIVER/GB)  Result Date: 03/19/2021 CLINICAL DATA:  Abnormal LFTs EXAM: ULTRASOUND ABDOMEN LIMITED RIGHT UPPER QUADRANT COMPARISON:  Same day CT abdomen and pelvis FINDINGS: Gallbladder: Prior cholecystectomy. Common bile duct: Diameter: 1.3 cm, dilated. Liver: No focal lesion identified, although visualization of the liver is limited due to patient respiratory motion. Within normal limits in parenchymal echogenicity. Portal vein is patent on color Doppler imaging with normal direction of blood flow towards the liver. Other: Intrahepatic biliary ductal dilation. IMPRESSION: Dilated common bile duct, measuring up to 1.3 cm. Dilated intrahepatic bile ducts. Electronically Signed   By: Yetta Glassman MD   On: 03/19/2021 08:18    DISCHARGE EXAMINATION: Vitals:   03/22/21 1208 03/22/21 1346 03/22/21 2047 03/23/21 0512  BP: (!) 157/87 (!) 155/85 (!) 141/72 (!) 178/84  Pulse: 94 61 62 (!) 58  Resp: '15 16 18 20  '$ Temp: (!) 97.5 F (36.4 C) 97.6 F (36.4 C) 97.9 F (36.6 C) (!) 97.4 F (36.3 C)  TempSrc:  Oral Oral Oral  SpO2: 97% 99% 99% 99%  Weight:      Height:       General appearance: Awake alert.  In no distress No lesions identified in the oral cavity. Resp: Coarse breath sounds bilaterally.  No definite wheezing rales or rhonchi. Cardio: S1-S2 is normal regular.  No S3-S4.  No rubs murmurs or bruit GI: Abdomen is soft.  Nontender nondistended.  Bowel sounds are present normal.  No masses organomegaly    DISPOSITION: SNF  Discharge Instructions     Call MD for:  difficulty breathing, headache or  visual disturbances   Complete by: As directed    Call MD for:  extreme fatigue   Complete by: As directed    Call MD for:  persistant dizziness or light-headedness   Complete by: As directed    Call MD for:  persistant nausea and vomiting   Complete by: As directed    Call MD for:  severe uncontrolled pain   Complete by: As directed    Call MD for:  temperature >100.4  Complete by: As directed    Diet - low sodium heart healthy   Complete by: As directed    Discharge instructions   Complete by: As directed    Please review instructions on the discharge summary.  You were cared for by a hospitalist during your hospital stay. If you have any questions about your discharge medications or the care you received while you were in the hospital after you are discharged, you can call the unit and asked to speak with the hospitalist on call if the hospitalist that took care of you is not available. Once you are discharged, your primary care physician will handle any further medical issues. Please note that NO REFILLS for any discharge medications will be authorized once you are discharged, as it is imperative that you return to your primary care physician (or establish a relationship with a primary care physician if you do not have one) for your aftercare needs so that they can reassess your need for medications and monitor your lab values. If you do not have a primary care physician, you can call [phone] for a physician referral.   Increase activity slowly   Complete by: As directed          Allergies as of 03/23/2021   No Known Allergies      Medication List     STOP taking these medications    amLODipine 2.5 MG tablet Commonly known as: NORVASC   meclizine 12.5 MG tablet Commonly known as: ANTIVERT       TAKE these medications    acetaminophen 500 MG tablet Commonly known as: TYLENOL Take 650 mg by mouth every 8 (eight) hours as needed for mild pain, moderate pain,  headache or fever.   aMILoride 5 MG tablet Commonly known as: MIDAMOR Take 5 mg by mouth daily.   aspirin 81 MG chewable tablet Chew 81 mg by mouth daily.   CITRACAL PLUS PO Take 1 tablet by mouth daily.   Cranberry 450 MG Tabs Take 1 tablet by mouth in the morning and at bedtime.   famotidine 20 MG tablet Commonly known as: PEPCID Take 20 mg by mouth 2 (two) times daily.   ferrous sulfate 325 (65 FE) MG tablet Commonly known as: FerrouSul Take 1 tablet (325 mg total) by mouth daily with breakfast. What changed: when to take this   furosemide 80 MG tablet Commonly known as: LASIX TAKE ONE-HALF TABLET BY MOUTH ONCE DAILY What changed:  how much to take how to take this when to take this   isosorbide mononitrate 30 MG 24 hr tablet Commonly known as: IMDUR Take 0.5 tablets (15 mg total) by mouth daily.   melatonin 5 MG Tabs Take 10 mg by mouth at bedtime.   nitroGLYCERIN 0.4 MG SL tablet Commonly known as: NITROSTAT Place 1 tablet (0.4 mg total) under the tongue every 5 (five) minutes as needed for chest pain.   ondansetron 4 MG tablet Commonly known as: ZOFRAN Take 4 mg by mouth every 8 (eight) hours as needed for nausea or vomiting.   phenol 1.4 % Liqd Commonly known as: CHLORASEPTIC Use as directed 1 spray in the mouth or throat as needed for throat irritation / pain.   polyethylene glycol 17 g packet Commonly known as: MIRALAX / GLYCOLAX Take 17 g by mouth 2 (two) times daily. What changed:  when to take this reasons to take this   rOPINIRole 2 MG tablet Commonly known as: REQUIP Take 2 mg by mouth  at bedtime.   senna-docusate 8.6-50 MG tablet Commonly known as: Senokot-S Take 2 tablets by mouth 2 (two) times daily.   tamsulosin 0.4 MG Caps capsule Commonly known as: FLOMAX Take 0.4 mg by mouth daily.   Vitamin D-3 125 MCG (5000 UT) Tabs Take 1 tablet by mouth daily.           TOTAL DISCHARGE TIME: 35 minutes  Chanda Laperle [employer] on [email or web address]  03/23/2021, 11:38 AM

## 2021-03-23 NOTE — Anesthesia Postprocedure Evaluation (Signed)
Anesthesia Post Note  Patient: Susan Glass  Procedure(s) Performed: ENDOSCOPIC RETROGRADE CHOLANGIOPANCREATOGRAPHY (ERCP) SPHINCTEROTOMY  Patient location during evaluation: PACU Anesthesia Type: General Level of consciousness: awake and alert Pain management: pain level controlled Vital Signs Assessment: post-procedure vital signs reviewed and stable Respiratory status: spontaneous breathing, nonlabored ventilation, respiratory function stable and patient connected to nasal cannula oxygen Cardiovascular status: blood pressure returned to baseline and stable Postop Assessment: no apparent nausea or vomiting Anesthetic complications: no   No notable events documented.   Last Vitals:  Vitals:   03/22/21 2047 03/23/21 0512  BP: (!) 141/72 (!) 178/84  Pulse: 62 (!) 58  Resp: 18 20  Temp: 36.6 C (!) 36.3 C  SpO2: 99% 99%    Last Pain:  Vitals:   03/23/21 1200  TempSrc:   PainSc: Asleep                 Nicanor Alcon

## 2021-03-23 NOTE — Progress Notes (Signed)
Maylon Peppers, M.D. Gastroenterology & Hepatology   Interval History:  No acute events overnight. Patient reports feeling well, denies pain concerns at the moment.  States that her abdomen still slightly sore in her right upper quadrant but denies any nausea, vomiting, fever, chills, abdominal distention.  Has been able to tolerate some food. Labs today showed improvement in her aminotransferases with AST of 34, ALT of 61, total bilirubin 0.8, alkaline phosphatase 103.  CBC showed normal white blood cell count.  Inpatient Medications:  Current Facility-Administered Medications:    acetaminophen (TYLENOL) tablet 650 mg, 650 mg, Oral, Q6H PRN, 650 mg at 03/23/21 0602 **OR** acetaminophen (TYLENOL) suppository 650 mg, 650 mg, Rectal, Q6H PRN, Reubin Milan, MD   cefTRIAXone (ROCEPHIN) 2 g in sodium chloride 0.9 % 100 mL IVPB, 2 g, Intravenous, Q24H, Reubin Milan, MD, Last Rate: 200 mL/hr at 03/23/21 0835, 2 g at 03/23/21 0835   isosorbide mononitrate (IMDUR) 24 hr tablet 15 mg, 15 mg, Oral, Daily, Bonnielee Haff, MD, 15 mg at 03/23/21 2458   metroNIDAZOLE (FLAGYL) IVPB 500 mg, 500 mg, Intravenous, Q8H, Reubin Milan, MD, Last Rate: 100 mL/hr at 03/23/21 0925, 500 mg at 03/23/21 0925   ondansetron (ZOFRAN) tablet 4 mg, 4 mg, Oral, Q6H PRN **OR** ondansetron (ZOFRAN) injection 4 mg, 4 mg, Intravenous, Q6H PRN, Reubin Milan, MD, 4 mg at 03/21/21 2136   pantoprazole (PROTONIX) EC tablet 40 mg, 40 mg, Oral, BID, Harper, Kristen S, PA-C, 40 mg at 03/23/21 0998   phenol (CHLORASEPTIC) mouth spray 1 spray, 1 spray, Mouth/Throat, PRN, Bonnielee Haff, MD   polyethylene glycol (MIRALAX / GLYCOLAX) packet 17 g, 17 g, Oral, BID, Bonnielee Haff, MD, 17 g at 03/23/21 3382   rOPINIRole (REQUIP) tablet 2 mg, 2 mg, Oral, QHS, Bonnielee Haff, MD, 2 mg at 03/22/21 2211   senna-docusate (Senokot-S) tablet 2 tablet, 2 tablet, Oral, BID, Bonnielee Haff, MD, 2 tablet at 03/23/21 5053    tamsulosin (FLOMAX) capsule 0.4 mg, 0.4 mg, Oral, Daily, Bonnielee Haff, MD, 0.4 mg at 03/23/21 9767   I/O    Intake/Output Summary (Last 24 hours) at 03/23/2021 1424 Last data filed at 03/23/2021 1000 Gross per 24 hour  Intake 1062.86 ml  Output --  Net 1062.86 ml     Physical Exam: Temp:  [97.4 F (36.3 C)-97.9 F (36.6 C)] 97.4 F (36.3 C) (08/09 0512) Pulse Rate:  [58-62] 58 (08/09 0512) Resp:  [18-20] 20 (08/09 0512) BP: (141-178)/(72-84) 178/84 (08/09 0512) SpO2:  [99 %] 99 % (08/09 0512)  Temp (24hrs), Avg:97.7 F (36.5 C), Min:97.4 F (36.3 C), Max:97.9 F (36.6 C) GENERAL: The patient is AO x3, in no acute distress. Elder and frail. HEENT: Head is normocephalic and atraumatic. EOMI are intact. Mouth is well hydrated and without lesions. NECK: Supple. No masses LUNGS: Clear to auscultation. No presence of rhonchi/wheezing/rales. Adequate chest expansion HEART: RRR, normal s1 and s2. ABDOMEN: Soft, nontender, no guarding, no peritoneal signs, and nondistended. BS +. No masses. EXTREMITIES: Without any cyanosis, clubbing, rash, lesions or edema. NEUROLOGIC: AOx3, no focal motor deficit. SKIN: no jaundice, no rashes  Laboratory Data: CBC:     Component Value Date/Time   WBC 7.2 03/23/2021 0356   RBC 4.17 03/23/2021 0356   HGB 12.7 03/23/2021 0356   HCT 39.9 03/23/2021 0356   HCT 41.9 12/27/2017 2119   PLT 140 (L) 03/23/2021 0356   MCV 95.7 03/23/2021 0356   MCH 30.5 03/23/2021 0356   MCHC 31.8 03/23/2021  0356   RDW 14.1 03/23/2021 0356   LYMPHSABS 0.6 (L) 03/19/2021 0357   MONOABS 0.8 03/19/2021 0357   EOSABS 0.1 03/19/2021 0357   BASOSABS 0.0 03/19/2021 0357   COAG:  Lab Results  Component Value Date   INR 1.10 12/28/2017   INR 1.2 11/11/2008    BMP:  BMP Latest Ref Rng & Units 03/23/2021 03/22/2021 03/21/2021  Glucose 70 - 99 mg/dL 84 96 116(H)  BUN 8 - 23 mg/dL _0 Creatinine 0.44 - 1.00 mg/dL 0.97 0.83 0.85  Sodium 135 - 145 mmol/L 134(L) 138 135   Potassium 3.5 - 5.1 mmol/L 4.7 4.1 4.3  Chloride 98 - 111 mmol/L 103 108 105  CO2 22 - 32 mmol/L _1 Calcium 8.9 - 10.3 mg/dL 8.6(L) 8.3(L) 8.2(L)    HEPATIC:  Hepatic Function Latest Ref Rng & Units 03/23/2021 03/22/2021 03/21/2021  Total Protein 6.5 - 8.1 g/dL 5.8(L) 5.3(L) 5.6(L)  Albumin 3.5 - 5.0 g/dL 3.2(L) 3.0(L) 3.1(L)  AST 15 - 41 U/L 34 33 50(H)  ALT 0 - 44 U/L 61(H) 69(H) 95(H)  Alk Phosphatase 38 - 126 U/L 103 101 117  Total Bilirubin 0.3 - 1.2 mg/dL 0.8 0.9 0.9    CARDIAC:  Lab Results  Component Value Date   CKTOTAL 70 11/11/2008   CKMB 2.3 11/11/2008   TROPONINI <0.03 12/29/2017      Imaging: I personally reviewed and interpreted the available labs, imaging and endoscopic files.   Assessment/Plan: 87 year old female with past medical history of coronary artery disease, CHF, GERD, hiatal hernia, hyperlipidemia, hypertension and iron deficiency anemia who was admitted to the hospital after presenting worsening hypoxia and postprandial abdominal pain with nausea and rigors.  Patient presented changes concerning for cholangitis as she had elevation of her liver enzymes and fever.  CT of the abdomen and pelvis showed presence of diffuse intrahepatic and extrahepatic ductal dilation s/p cholecystectomy.  Due to dilation of her biliary system she underwent right upper quadrant abdominal ultrasound that showed dilated CBD up to 13 mm with dilated intrahepatic bile ducts.  Due to this, the patient subsequently underwent an ERCP yesterday, she was found to have stenosis of the major papilla, sphincterotomy was performed and multiple tiny black stones came out.  Since then, she has presented improvement of her symptoms and has tolerated diet adequately.  She was successfully treated for her episode of choledocholithiasis and cholangitis and is stable for discharge today.  She will benefit from short course of antibiotics for total 5 days (she finished her course today).  Given her  age, she is not the best surgical candidate and will give her evaluation by surgery for cholecystectomy.  - Finish 5 day antibiotic course  - Advance diet as tolerated  Maylon Peppers, MD Gastroenterology and Hepatology [facility]

## 2021-05-06 ENCOUNTER — Emergency Department (HOSPITAL_COMMUNITY): Payer: Medicare PPO

## 2021-05-06 ENCOUNTER — Other Ambulatory Visit: Payer: Self-pay

## 2021-05-06 ENCOUNTER — Inpatient Hospital Stay (HOSPITAL_COMMUNITY)
Admission: EM | Admit: 2021-05-06 | Discharge: 2021-05-11 | DRG: 177 | Disposition: A | Discharge status: Skilled Nursing Facility | Payer: Medicare PPO | Source: Skilled Nursing Facility | Attending: Family Medicine | Admitting: Family Medicine

## 2021-05-06 DIAGNOSIS — D509 Iron deficiency anemia, unspecified: Secondary | ICD-10-CM | POA: Diagnosis present

## 2021-05-06 DIAGNOSIS — Z634 Disappearance and death of family member: Secondary | ICD-10-CM | POA: Diagnosis not present

## 2021-05-06 DIAGNOSIS — E785 Hyperlipidemia, unspecified: Secondary | ICD-10-CM | POA: Diagnosis present

## 2021-05-06 DIAGNOSIS — N390 Urinary tract infection, site not specified: Secondary | ICD-10-CM | POA: Diagnosis present

## 2021-05-06 DIAGNOSIS — Z7982 Long term (current) use of aspirin: Secondary | ICD-10-CM | POA: Diagnosis not present

## 2021-05-06 DIAGNOSIS — J1282 Pneumonia due to coronavirus disease 2019: Secondary | ICD-10-CM | POA: Diagnosis present

## 2021-05-06 DIAGNOSIS — Z8249 Family history of ischemic heart disease and other diseases of the circulatory system: Secondary | ICD-10-CM

## 2021-05-06 DIAGNOSIS — F419 Anxiety disorder, unspecified: Secondary | ICD-10-CM | POA: Diagnosis present

## 2021-05-06 DIAGNOSIS — N1831 Chronic kidney disease, stage 3a: Secondary | ICD-10-CM | POA: Diagnosis present

## 2021-05-06 DIAGNOSIS — R1310 Dysphagia, unspecified: Secondary | ICD-10-CM | POA: Diagnosis present

## 2021-05-06 DIAGNOSIS — J189 Pneumonia, unspecified organism: Secondary | ICD-10-CM | POA: Diagnosis present

## 2021-05-06 DIAGNOSIS — Z9049 Acquired absence of other specified parts of digestive tract: Secondary | ICD-10-CM | POA: Diagnosis not present

## 2021-05-06 DIAGNOSIS — E7800 Pure hypercholesterolemia, unspecified: Secondary | ICD-10-CM | POA: Diagnosis present

## 2021-05-06 DIAGNOSIS — J9601 Acute respiratory failure with hypoxia: Secondary | ICD-10-CM | POA: Diagnosis present

## 2021-05-06 DIAGNOSIS — K449 Diaphragmatic hernia without obstruction or gangrene: Secondary | ICD-10-CM | POA: Diagnosis present

## 2021-05-06 DIAGNOSIS — U071 COVID-19: Secondary | ICD-10-CM | POA: Diagnosis present

## 2021-05-06 DIAGNOSIS — I130 Hypertensive heart and chronic kidney disease with heart failure and stage 1 through stage 4 chronic kidney disease, or unspecified chronic kidney disease: Secondary | ICD-10-CM | POA: Diagnosis present

## 2021-05-06 DIAGNOSIS — Z79899 Other long term (current) drug therapy: Secondary | ICD-10-CM | POA: Diagnosis not present

## 2021-05-06 DIAGNOSIS — K219 Gastro-esophageal reflux disease without esophagitis: Secondary | ICD-10-CM | POA: Diagnosis present

## 2021-05-06 DIAGNOSIS — Z66 Do not resuscitate: Secondary | ICD-10-CM | POA: Diagnosis present

## 2021-05-06 DIAGNOSIS — I10 Essential (primary) hypertension: Secondary | ICD-10-CM | POA: Diagnosis present

## 2021-05-06 DIAGNOSIS — Z22322 Carrier or suspected carrier of Methicillin resistant Staphylococcus aureus: Secondary | ICD-10-CM

## 2021-05-06 DIAGNOSIS — F05 Delirium due to known physiological condition: Secondary | ICD-10-CM | POA: Diagnosis present

## 2021-05-06 DIAGNOSIS — A419 Sepsis, unspecified organism: Secondary | ICD-10-CM

## 2021-05-06 DIAGNOSIS — I5032 Chronic diastolic (congestive) heart failure: Secondary | ICD-10-CM | POA: Diagnosis present

## 2021-05-06 DIAGNOSIS — I2510 Atherosclerotic heart disease of native coronary artery without angina pectoris: Secondary | ICD-10-CM | POA: Diagnosis present

## 2021-05-06 DIAGNOSIS — R0902 Hypoxemia: Secondary | ICD-10-CM

## 2021-05-06 DIAGNOSIS — G9340 Encephalopathy, unspecified: Secondary | ICD-10-CM

## 2021-05-06 LAB — COMPREHENSIVE METABOLIC PANEL
ALT: 13 U/L (ref 0–44)
AST: 15 U/L (ref 15–41)
Albumin: 3.6 g/dL (ref 3.5–5.0)
Alkaline Phosphatase: 66 U/L (ref 38–126)
Anion gap: 6 (ref 5–15)
BUN: 18 mg/dL (ref 8–23)
CO2: 26 mmol/L (ref 22–32)
Calcium: 8.5 mg/dL — ABNORMAL LOW (ref 8.9–10.3)
Chloride: 105 mmol/L (ref 98–111)
Creatinine, Ser: 0.86 mg/dL (ref 0.44–1.00)
GFR, Estimated: 60 mL/min — ABNORMAL LOW (ref >60)
Glucose, Bld: 95 mg/dL (ref 70–99)
Potassium: 4.2 mmol/L (ref 3.5–5.1)
Sodium: 137 mmol/L (ref 135–145)
Total Bilirubin: 0.6 mg/dL (ref 0.3–1.2)
Total Protein: 6.5 g/dL (ref 6.5–8.1)

## 2021-05-06 LAB — CBC WITH DIFFERENTIAL/PLATELET
Abs Immature Granulocytes: 0.04 10*3/uL (ref 0.00–0.07)
Basophils Absolute: 0 10*3/uL (ref 0.0–0.1)
Basophils Relative: 0 %
Eosinophils Absolute: 0.6 10*3/uL — ABNORMAL HIGH (ref 0.0–0.5)
Eosinophils Relative: 5 %
HCT: 36.5 % (ref 36.0–46.0)
Hemoglobin: 11.7 g/dL — ABNORMAL LOW (ref 12.0–15.0)
Immature Granulocytes: 0 %
Lymphocytes Relative: 18 %
Lymphs Abs: 2 10*3/uL (ref 0.7–4.0)
MCH: 30.7 pg (ref 26.0–34.0)
MCHC: 32.1 g/dL (ref 30.0–36.0)
MCV: 95.8 fL (ref 80.0–100.0)
Monocytes Absolute: 0.8 10*3/uL (ref 0.1–1.0)
Monocytes Relative: 8 %
Neutro Abs: 7.5 10*3/uL (ref 1.7–7.7)
Neutrophils Relative %: 69 %
Platelets: 185 10*3/uL (ref 150–400)
RBC: 3.81 MIL/uL — ABNORMAL LOW (ref 3.87–5.11)
RDW: 14.7 % (ref 11.5–15.5)
WBC: 11 10*3/uL — ABNORMAL HIGH (ref 4.0–10.5)
nRBC: 0 % (ref 0.0–0.2)

## 2021-05-06 LAB — BLOOD GAS, VENOUS
Acid-Base Excess: 2.7 mmol/L — ABNORMAL HIGH (ref 0.0–2.0)
Bicarbonate: 25.9 mmol/L (ref 20.0–28.0)
FIO2: 28
O2 Saturation: 78.6 %
Patient temperature: 37
pCO2, Ven: 49.6 mmHg (ref 44.0–60.0)
pH, Ven: 7.364 (ref 7.250–7.430)
pO2, Ven: 48.5 mmHg — ABNORMAL HIGH (ref 32.0–45.0)

## 2021-05-06 LAB — URINALYSIS, ROUTINE W REFLEX MICROSCOPIC
Bilirubin Urine: NEGATIVE
Glucose, UA: 100 mg/dL — AB
Hgb urine dipstick: NEGATIVE
Ketones, ur: NEGATIVE mg/dL
Nitrite: POSITIVE — AB
Specific Gravity, Urine: >1.03 — ABNORMAL HIGH (ref 1.005–1.030)
pH: 5.5 (ref 5.0–8.0)

## 2021-05-06 LAB — URINALYSIS, MICROSCOPIC (REFLEX): RBC / HPF: NONE SEEN RBC/hpf (ref 0–5)

## 2021-05-06 LAB — SALICYLATE LEVEL: Salicylate Lvl: <7 mg/dL — ABNORMAL LOW (ref 7.0–30.0)

## 2021-05-06 LAB — APTT: aPTT: 31 seconds (ref 24–36)

## 2021-05-06 LAB — LACTIC ACID, PLASMA
Lactic Acid, Venous: 0.9 mmol/L (ref 0.5–1.9)
Lactic Acid, Venous: 0.9 mmol/L (ref 0.5–1.9)

## 2021-05-06 LAB — TROPONIN I (HIGH SENSITIVITY)
Troponin I (High Sensitivity): 9 ng/L (ref <18)
Troponin I (High Sensitivity): 8 ng/L (ref <18)

## 2021-05-06 LAB — RESP PANEL BY RT-PCR (FLU A&B, COVID) ARPGX2
Influenza A by PCR: NEGATIVE
Influenza B by PCR: NEGATIVE
SARS Coronavirus 2 by RT PCR: POSITIVE — AB

## 2021-05-06 LAB — OSMOLALITY: Osmolality: 290 mOsm/kg (ref 275–295)

## 2021-05-06 LAB — PROCALCITONIN: Procalcitonin: 0.19 ng/mL

## 2021-05-06 LAB — AMMONIA: Ammonia: 11 umol/L (ref 9–35)

## 2021-05-06 LAB — PROTIME-INR
INR: 1.2 (ref 0.8–1.2)
Prothrombin Time: 15.5 seconds — ABNORMAL HIGH (ref 11.4–15.2)

## 2021-05-06 LAB — MRSA NEXT GEN BY PCR, NASAL: MRSA by PCR Next Gen: DETECTED — AB

## 2021-05-06 LAB — BRAIN NATRIURETIC PEPTIDE: B Natriuretic Peptide: 529 pg/mL — ABNORMAL HIGH (ref 0.0–100.0)

## 2021-05-06 MED ORDER — SODIUM CHLORIDE 0.9 % IV SOLN: 200.0000 mg | Freq: Once | INTRAVENOUS | Status: DC

## 2021-05-06 MED ORDER — LORAZEPAM 0.5 MG PO TABS
0.5000 mg | ORAL_TABLET | Freq: Four times a day (QID) | ORAL | Status: DC | PRN
  Administered 2021-05-07 – 2021-05-09 (×4): 0.5 mg via ORAL
  Filled 2021-05-06 (×4): qty 1

## 2021-05-06 MED ORDER — VITAMIN D 25 MCG (1000 UNIT) PO TABS
5000.0000 [IU] | ORAL_TABLET | Freq: Every day | ORAL | Status: DC
  Administered 2021-05-06 – 2021-05-11 (×6): 5000 [IU] via ORAL
  Filled 2021-05-06 (×6): qty 5

## 2021-05-06 MED ORDER — METHYLPREDNISOLONE SODIUM SUCC 40 MG IJ SOLR: 0.5000 mg/kg | Freq: Two times a day (BID) | INTRAMUSCULAR | Status: DC

## 2021-05-06 MED ORDER — ASPIRIN 81 MG PO CHEW
81.0000 mg | CHEWABLE_TABLET | Freq: Every day | ORAL | Status: DC
  Administered 2021-05-06 – 2021-05-11 (×6): 81 mg via ORAL
  Filled 2021-05-06 (×6): qty 1

## 2021-05-06 MED ORDER — ONDANSETRON HCL 4 MG PO TABS: 4.0000 mg | ORAL_TABLET | Freq: Three times a day (TID) | ORAL | Status: DC | PRN

## 2021-05-06 MED ORDER — ROPINIROLE HCL 1 MG PO TABS
2.0000 mg | ORAL_TABLET | Freq: Every day | ORAL | Status: DC
  Administered 2021-05-06 – 2021-05-11 (×6): 2 mg via ORAL
  Filled 2021-05-06 (×6): qty 2

## 2021-05-06 MED ORDER — SODIUM CHLORIDE 0.9 % IV SOLN
1.0000 g | INTRAVENOUS | Status: DC
  Administered 2021-05-07 – 2021-05-09 (×3): 1 g via INTRAVENOUS
  Filled 2021-05-06 (×3): qty 10

## 2021-05-06 MED ORDER — GUAIFENESIN-DM 100-10 MG/5ML PO SYRP: 10.0000 mL | ORAL_SOLUTION | ORAL | Status: DC | PRN

## 2021-05-06 MED ORDER — LABETALOL HCL 5 MG/ML IV SOLN: 10.0000 mg | INTRAVENOUS | Status: DC | PRN

## 2021-05-06 MED ORDER — HYDROCOD POLST-CPM POLST ER 10-8 MG/5ML PO SUER: 5.0000 mL | Freq: Two times a day (BID) | ORAL | Status: DC | PRN

## 2021-05-06 MED ORDER — PANTOPRAZOLE SODIUM 40 MG IV SOLR
40.0000 mg | Freq: Once | INTRAVENOUS | Status: AC
  Administered 2021-05-06: 40 mg via INTRAVENOUS
  Filled 2021-05-06: qty 40

## 2021-05-06 MED ORDER — ZIPRASIDONE MESYLATE 20 MG IM SOLR: 10.0000 mg | Freq: Once | INTRAMUSCULAR | Status: DC

## 2021-05-06 MED ORDER — ASCORBIC ACID 500 MG PO TABS
500.0000 mg | ORAL_TABLET | Freq: Every day | ORAL | Status: DC
  Administered 2021-05-06 – 2021-05-11 (×6): 500 mg via ORAL
  Filled 2021-05-06 (×6): qty 1

## 2021-05-06 MED ORDER — TAMSULOSIN HCL 0.4 MG PO CAPS
0.4000 mg | ORAL_CAPSULE | Freq: Every day | ORAL | Status: DC
  Administered 2021-05-06 – 2021-05-11 (×6): 0.4 mg via ORAL
  Filled 2021-05-06 (×7): qty 1

## 2021-05-06 MED ORDER — METHYLPREDNISOLONE SODIUM SUCC 40 MG IJ SOLR
0.5000 mg/kg | Freq: Two times a day (BID) | INTRAMUSCULAR | Status: AC
  Administered 2021-05-06 – 2021-05-08 (×5): 35.2 mg via INTRAVENOUS
  Filled 2021-05-06 (×6): qty 1

## 2021-05-06 MED ORDER — AMILORIDE HCL 5 MG PO TABS: 5.0000 mg | ORAL_TABLET | Freq: Every day | ORAL | Status: DC

## 2021-05-06 MED ORDER — PANTOPRAZOLE SODIUM 40 MG PO TBEC
40.0000 mg | DELAYED_RELEASE_TABLET | Freq: Every day | ORAL | Status: DC
  Administered 2021-05-06 – 2021-05-11 (×6): 40 mg via ORAL
  Filled 2021-05-06 (×6): qty 1

## 2021-05-06 MED ORDER — MELATONIN 3 MG PO TABS
9.0000 mg | ORAL_TABLET | Freq: Every day | ORAL | Status: DC
  Administered 2021-05-06 – 2021-05-11 (×6): 9 mg via ORAL
  Filled 2021-05-06 (×6): qty 3

## 2021-05-06 MED ORDER — SODIUM CHLORIDE 0.9% FLUSH: 3.0000 mL | Freq: Two times a day (BID) | INTRAVENOUS | Status: DC

## 2021-05-06 MED ORDER — THIAMINE HCL 100 MG PO TABS
100.0000 mg | ORAL_TABLET | Freq: Every day | ORAL | Status: DC
  Administered 2021-05-06 – 2021-05-11 (×6): 100 mg via ORAL
  Filled 2021-05-06 (×6): qty 1

## 2021-05-06 MED ORDER — FAMOTIDINE 20 MG PO TABS: 20.0000 mg | ORAL_TABLET | Freq: Two times a day (BID) | ORAL | Status: DC

## 2021-05-06 MED ORDER — ONDANSETRON HCL 4 MG PO TABS: 4.0000 mg | ORAL_TABLET | Freq: Four times a day (QID) | ORAL | Status: DC | PRN

## 2021-05-06 MED ORDER — PREDNISONE 20 MG PO TABS
50.0000 mg | ORAL_TABLET | Freq: Every day | ORAL | Status: DC
  Administered 2021-05-09 – 2021-05-11 (×3): 50 mg via ORAL
  Filled 2021-05-06 (×3): qty 1

## 2021-05-06 MED ORDER — ZINC SULFATE 220 (50 ZN) MG PO CAPS
220.0000 mg | ORAL_CAPSULE | Freq: Every day | ORAL | Status: DC
  Administered 2021-05-06 – 2021-05-11 (×6): 220 mg via ORAL
  Filled 2021-05-06 (×6): qty 1

## 2021-05-06 MED ORDER — SODIUM CHLORIDE 0.9 % IV SOLN
500.0000 mg | INTRAVENOUS | Status: DC
  Filled 2021-05-06: qty 500

## 2021-05-06 MED ORDER — ALBUTEROL SULFATE (2.5 MG/3ML) 0.083% IN NEBU
2.5000 mg | INHALATION_SOLUTION | RESPIRATORY_TRACT | Status: DC | PRN
Start: 2021-05-06 — End: 2021-05-06
  Administered 2021-05-06: 2.5 mg via RESPIRATORY_TRACT
  Filled 2021-05-06: qty 3

## 2021-05-06 MED ORDER — METHYLPREDNISOLONE SODIUM SUCC 40 MG IJ SOLR
0.5000 mg/kg | Freq: Two times a day (BID) | INTRAMUSCULAR | Status: DC
Start: 2021-05-07 — End: 2021-05-06

## 2021-05-06 MED ORDER — TRAZODONE HCL 50 MG PO TABS
50.0000 mg | ORAL_TABLET | Freq: Every evening | ORAL | Status: DC | PRN
  Administered 2021-05-06 – 2021-05-09 (×3): 50 mg via ORAL
  Filled 2021-05-06 (×3): qty 1

## 2021-05-06 MED ORDER — SODIUM CHLORIDE 0.9 % IV SOLN
2.0000 g | Freq: Once | INTRAVENOUS | Status: AC
  Administered 2021-05-06: 2 g via INTRAVENOUS
  Filled 2021-05-06: qty 20

## 2021-05-06 MED ORDER — SODIUM CHLORIDE 0.9 % IV SOLN
100.0000 mg | Freq: Every day | INTRAVENOUS | Status: AC
  Administered 2021-05-07 – 2021-05-10 (×4): 100 mg via INTRAVENOUS
  Filled 2021-05-06 (×4): qty 20

## 2021-05-06 MED ORDER — CALCIUM CARBONATE 1250 (500 CA) MG PO TABS
500.0000 mg | ORAL_TABLET | Freq: Every day | ORAL | Status: DC
  Administered 2021-05-07 – 2021-05-11 (×5): 500 mg via ORAL
  Filled 2021-05-06 (×5): qty 1

## 2021-05-06 MED ORDER — ONDANSETRON HCL 4 MG/2ML IJ SOLN: 4.0000 mg | Freq: Four times a day (QID) | INTRAMUSCULAR | Status: DC | PRN

## 2021-05-06 MED ORDER — FERROUS SULFATE 325 (65 FE) MG PO TABS
325.0000 mg | ORAL_TABLET | Freq: Every day | ORAL | Status: DC
  Administered 2021-05-07 – 2021-05-11 (×5): 325 mg via ORAL
  Filled 2021-05-06 (×5): qty 1

## 2021-05-06 MED ORDER — ACETAMINOPHEN 325 MG PO TABS
650.0000 mg | ORAL_TABLET | Freq: Three times a day (TID) | ORAL | Status: DC | PRN
  Filled 2021-05-06: qty 2

## 2021-05-06 MED ORDER — SODIUM CHLORIDE 0.9 % IV SOLN
100.0000 mg | INTRAVENOUS | Status: AC
  Administered 2021-05-06 (×2): 100 mg via INTRAVENOUS
  Filled 2021-05-06 (×2): qty 20

## 2021-05-06 MED ORDER — PHENOL 1.4 % MT LIQD: 1.0000 | OROMUCOSAL | Status: DC | PRN

## 2021-05-06 MED ORDER — DIVALPROEX SODIUM 125 MG PO DR TAB
125.0000 mg | DELAYED_RELEASE_TABLET | Freq: Two times a day (BID) | ORAL | Status: DC
  Administered 2021-05-06 – 2021-05-11 (×10): 125 mg via ORAL
  Filled 2021-05-06 (×14): qty 1

## 2021-05-06 MED ORDER — SODIUM CHLORIDE 0.9% FLUSH
3.0000 mL | Freq: Two times a day (BID) | INTRAVENOUS | Status: DC
  Administered 2021-05-08 – 2021-05-11 (×5): 3 mL via INTRAVENOUS

## 2021-05-06 MED ORDER — SODIUM CHLORIDE 0.9 % IV SOLN: 100.0000 mg | Freq: Every day | INTRAVENOUS | Status: DC

## 2021-05-06 MED ORDER — SODIUM CHLORIDE 0.9% FLUSH
3.0000 mL | INTRAVENOUS | Status: DC | PRN
  Administered 2021-05-07: 3 mL via INTRAVENOUS

## 2021-05-06 MED ORDER — POLYETHYLENE GLYCOL 3350 17 G PO PACK
17.0000 g | PACK | Freq: Two times a day (BID) | ORAL | Status: DC
  Administered 2021-05-06 – 2021-05-11 (×9): 17 g via ORAL
  Filled 2021-05-06 (×10): qty 1

## 2021-05-06 MED ORDER — ADULT MULTIVITAMIN W/MINERALS CH
1.0000 | ORAL_TABLET | Freq: Every day | ORAL | Status: DC
  Administered 2021-05-06 – 2021-05-11 (×6): 1 via ORAL
  Filled 2021-05-06 (×6): qty 1

## 2021-05-06 MED ORDER — SODIUM CHLORIDE 0.9 % IV SOLN: 250.0000 mL | INTRAVENOUS | Status: DC | PRN

## 2021-05-06 MED ORDER — ISOSORBIDE MONONITRATE ER 30 MG PO TB24
15.0000 mg | ORAL_TABLET | Freq: Every day | ORAL | Status: DC
  Administered 2021-05-06 – 2021-05-11 (×6): 15 mg via ORAL
  Filled 2021-05-06 (×6): qty 1

## 2021-05-06 MED ORDER — SODIUM CHLORIDE 0.9% FLUSH: 3.0000 mL | INTRAVENOUS | Status: DC | PRN

## 2021-05-06 MED ORDER — BISACODYL 10 MG RE SUPP: 10.0000 mg | Freq: Every day | RECTAL | Status: DC | PRN

## 2021-05-06 MED ORDER — SODIUM CHLORIDE 0.9% FLUSH
3.0000 mL | Freq: Two times a day (BID) | INTRAVENOUS | Status: DC
  Administered 2021-05-07 – 2021-05-08 (×3): 3 mL via INTRAVENOUS

## 2021-05-06 MED ORDER — ACETAMINOPHEN 325 MG PO TABS
650.0000 mg | ORAL_TABLET | Freq: Four times a day (QID) | ORAL | Status: DC | PRN
  Administered 2021-05-07: 650 mg via ORAL

## 2021-05-06 MED ORDER — SODIUM CHLORIDE 0.9 % IV SOLN: 1.0000 g | INTRAVENOUS | Status: DC

## 2021-05-06 MED ORDER — LACTATED RINGERS IV BOLUS
500.0000 mL | Freq: Once | INTRAVENOUS | Status: AC
  Administered 2021-05-06: 500 mL via INTRAVENOUS

## 2021-05-06 MED ORDER — IPRATROPIUM-ALBUTEROL 0.5-2.5 (3) MG/3ML IN SOLN
3.0000 mL | Freq: Once | RESPIRATORY_TRACT | Status: AC
  Administered 2021-05-06: 3 mL via RESPIRATORY_TRACT
  Filled 2021-05-06: qty 3

## 2021-05-06 MED ORDER — SENNOSIDES-DOCUSATE SODIUM 8.6-50 MG PO TABS
2.0000 | ORAL_TABLET | Freq: Two times a day (BID) | ORAL | Status: DC
  Administered 2021-05-06 – 2021-05-11 (×10): 2 via ORAL
  Filled 2021-05-06 (×10): qty 2

## 2021-05-06 MED ORDER — METHYLPREDNISOLONE SODIUM SUCC 125 MG IJ SOLR
60.0000 mg | Freq: Once | INTRAMUSCULAR | Status: AC
  Administered 2021-05-06: 60 mg via INTRAVENOUS
  Filled 2021-05-06: qty 2

## 2021-05-06 MED ORDER — NITROGLYCERIN 0.4 MG SL SUBL: 0.4000 mg | SUBLINGUAL_TABLET | SUBLINGUAL | Status: DC | PRN

## 2021-05-06 MED ORDER — PREDNISONE 50 MG PO TABS: 50.0000 mg | ORAL_TABLET | Freq: Every day | ORAL | Status: DC

## 2021-05-06 MED ORDER — ENOXAPARIN SODIUM 40 MG/0.4ML IJ SOSY: 40.0000 mg | PREFILLED_SYRINGE | INTRAMUSCULAR | Status: DC

## 2021-05-06 MED ORDER — ACETAMINOPHEN 650 MG RE SUPP: 650.0000 mg | Freq: Four times a day (QID) | RECTAL | Status: DC | PRN

## 2021-05-06 MED ORDER — SODIUM CHLORIDE 0.9 % IV SOLN
500.0000 mg | INTRAVENOUS | Status: DC
  Administered 2021-05-06 – 2021-05-07 (×2): 500 mg via INTRAVENOUS
  Filled 2021-05-06: qty 500

## 2021-05-06 MED ORDER — SODIUM CHLORIDE 0.9 % IV SOLN: INTRAVENOUS | Status: AC

## 2021-05-06 MED ORDER — ENOXAPARIN SODIUM 40 MG/0.4ML IJ SOSY
40.0000 mg | PREFILLED_SYRINGE | INTRAMUSCULAR | Status: DC
  Administered 2021-05-06 – 2021-05-11 (×6): 40 mg via SUBCUTANEOUS
  Filled 2021-05-06 (×6): qty 0.4

## 2021-05-06 MED ORDER — FOLIC ACID 1 MG PO TABS
1.0000 mg | ORAL_TABLET | Freq: Every day | ORAL | Status: DC
  Administered 2021-05-06 – 2021-05-11 (×6): 1 mg via ORAL
  Filled 2021-05-06 (×6): qty 1

## 2021-05-06 MED ORDER — IPRATROPIUM-ALBUTEROL 20-100 MCG/ACT IN AERS
1.0000 | INHALATION_SPRAY | Freq: Four times a day (QID) | RESPIRATORY_TRACT | Status: DC
  Administered 2021-05-06 – 2021-05-11 (×16): 1 via RESPIRATORY_TRACT
  Filled 2021-05-06: qty 4

## 2021-05-06 MED ORDER — GUAIFENESIN ER 600 MG PO TB12
600.0000 mg | ORAL_TABLET | Freq: Two times a day (BID) | ORAL | Status: DC
  Administered 2021-05-06 – 2021-05-11 (×11): 600 mg via ORAL
  Filled 2021-05-06 (×11): qty 1

## 2021-05-06 MED ORDER — IPRATROPIUM-ALBUTEROL 0.5-2.5 (3) MG/3ML IN SOLN: 3.0000 mL | Freq: Three times a day (TID) | RESPIRATORY_TRACT | Status: DC

## 2021-05-06 MED ORDER — AMLODIPINE BESYLATE 5 MG PO TABS
5.0000 mg | ORAL_TABLET | Freq: Every day | ORAL | Status: DC
  Administered 2021-05-06 – 2021-05-11 (×6): 5 mg via ORAL
  Filled 2021-05-06 (×7): qty 1

## 2021-05-06 NOTE — Plan of Care (Signed)
  Problem: Education: Goal: Knowledge of General Education information will improve Description: Including pain rating scale, medication(s)/side effects and non-pharmacologic comfort measures Outcome: Progressing   Problem: Education: Goal: Knowledge of risk factors and measures for prevention of condition will improve Outcome: Progressing

## 2021-05-06 NOTE — Progress Notes (Signed)
   05/06/21 1801  Assess: MEWS Score  Temp 97.7 F (36.5 C)  BP 138/60  Pulse Rate 73  Resp (!) 28  Level of Consciousness Alert  SpO2 100 %  O2 Device Nasal Cannula  O2 Flow Rate (L/min) 2 L/min  Assess: MEWS Score  MEWS Temp 0  MEWS Systolic 0  MEWS Pulse 0  MEWS RR 2  MEWS LOC 0  MEWS Score 2  MEWS Score Color Yellow  Assess: if the MEWS score is Yellow or Red  Were vital signs taken at a resting state? Yes  Focused Assessment No change from prior assessment  Early Detection of Sepsis Score *See Row Information* Medium  MEWS guidelines implemented *See Row Information* Yes  Escalate  MEWS: Escalate Yellow: discuss with charge nurse/RN and consider discussing with provider and RRT  Notify: Charge Nurse/RN  Name of Charge Nurse/RN Notified Raquel Sarna RN  Date Charge Nurse/RN Notified 05/06/21  Time Charge Nurse/RN Notified 1801 (1801)

## 2021-05-06 NOTE — ED Provider Notes (Signed)
[facility] EMERGENCY DEPARTMENT Provider Note   CSN: [id number] Arrival date & time: 05/06/21  7673     History Chief Complaint  Patient presents with   Altered Mental Status   Shortness of Breath    Susan Glass is a 87 y.o. female.  This is a 87 y.o. female with significant medical history as below, including CAD, CHF, iron deficiency anemia, DNR who presents to the ED with complaint of altered mentation, dyspnea.  No chest pain.  She has mild cough, unclear productive.  Febrile last night secondary to nursing home report.  Recent family member death, patient was given Ativan yesterday because she became very upset hearing about family member.  Following Ativan patient has become delirious, disoriented.  Febrile last night and this AM per EMS. Onset of dyspnea and fever yesterday evening.  Nursing home resident.  She denies abdominal pain, nausea or vomiting.  No home oxygen use.  Patient was started on 4 L nasal cannula at nursing home, she is not normally on oxygen.  Patient mains on nasal cannula in the ER.  Off nasal cannula as she saturates in the 88-91%   Level 5 caveat, delirium/respiratory distress   Called [facility] [phone]   [phone], left voicemail   The history is provided by the patient and the EMS personnel. The history is limited by the condition of the patient. No language interpreter was used.  Altered Mental Status Presenting symptoms: behavior changes and disorientation   Most recent episode:  Today Episode history:  Continuous Progression:  Unchanged Chronicity:  New Context: nursing home resident and recent change in medication   Associated symptoms: fever   Shortness of Breath Associated symptoms: cough and fever       Past Medical History:  Diagnosis Date   CAD (coronary artery disease)    CHF (congestive heart failure) (HCC)    GERD (gastroesophageal reflux disease)    GI bleed    UGI   Hiatal hernia    large,  sliding    Hyperlipidemia    hypercholesterolemia   Hypertension    IDA (iron deficiency anemia)     Patient Active Problem List   Diagnosis Date Noted   Pneumonia 05/06/2021   Fever 03/19/2021   Abnormal LFTs 03/19/2021   Polycythemia 03/19/2021   Hyperlipidemia    CKD (chronic kidney disease) stage 3, GFR 30-59 ml/min (HCC)    Unspecified atrial fibrillation (HCC)    Dilation of biliary tract    Transaminitis    Constipation    Abnormal CT scan, esophagus    Diarrhea 03/29/2018   Angina pectoris, unstable ([place]) 26/83/4196   Metabolic encephalopathy 22/29/7989   Acute encephalopathy 12/27/2017   GERD (gastroesophageal reflux disease) 04/29/2015   Gastritis and gastroduodenitis 11/29/2014   Weakness 11/27/2014   Edema extremities 11/17/2010   CAD, NATIVE VESSEL 08/19/2009   COMBINED HEART FAILURE, CHRONIC 08/19/2009   HYPERCHOLESTEROLEMIA 08/18/2009   Essential hypertension 08/18/2009    Past Surgical History:  Procedure Laterality Date   APPENDECTOMY     CHOLECYSTECTOMY     COLONOSCOPY  2008   Dr. Gala Romney: Hyperplastic polyp removed, oozing erosion at the ileocecal valve with benign pathology, likely NSAID related.   ERCP N/A 03/22/2021   Procedure: ENDOSCOPIC RETROGRADE CHOLANGIOPANCREATOGRAPHY (ERCP);  Surgeon: Rogene Houston, MD;  Location: [facility] ORS;  Service: Gastroenterology;  Laterality: N/A;   ESOPHAGEAL DILATION N/A 11/28/2014   Procedure: ESOPHAGEAL DILATION;  Surgeon: Danie Binder, MD;  Location: [facility] ENDO  SUITE;  Service: Endoscopy;  Laterality: N/A;   ESOPHAGOGASTRODUODENOSCOPY N/A 11/28/2014   Dr.Fields- UGI bleed d/t severe distal erosive esophagitis in the setting of GE JXN stricture, large sliding hiatal hernia, mild non-erosive gastritis   SPHINCTEROTOMY N/A 03/22/2021   Procedure: SPHINCTEROTOMY;  Surgeon: Rogene Houston, MD;  Location: [facility] OR[facility];  Service: Gastroenterology;  Laterality: N/A;     OB History   No obstetric history on file.     Family  History  Problem Relation Age of Onset   Hypertension Mother     Social History   Tobacco Use   Smoking status: Never   Smokeless tobacco: Never   Tobacco comments:    Never smoked  Vaping Use   Vaping Use: Never used  Substance Use Topics   Alcohol use: No    Alcohol/week: 0.0 standard drinks   Drug use: No    Home Medications Prior to Admission medications   Medication Sig Start Date End Date Taking? Authorizing Provider  acetaminophen (TYLENOL) 500 MG tablet Take 650 mg by mouth every 8 (eight) hours as needed for mild pain, moderate pain, headache or fever.    [provider]  aMILoride (MIDAMOR) 5 MG tablet Take 5 mg by mouth daily. 03/15/21   [provider]  aspirin 81 MG chewable tablet Chew 81 mg by mouth daily.     [provider]  Cholecalciferol (VITAMIN D-3) 125 MCG (5000 UT) TABS Take 1 tablet by mouth daily.    [provider]  Cranberry 450 MG TABS Take 1 tablet by mouth in the morning and at bedtime.    [provider]  famotidine (PEPCID) 20 MG tablet Take 20 mg by mouth 2 (two) times daily.    [provider]  ferrous sulfate (FERROUSUL) 325 (65 FE) MG tablet Take 1 tablet (325 mg total) by mouth daily with breakfast. 11/29/14   Sinda Du, MD  furosemide (LASIX) 80 MG tablet TAKE ONE-HALF TABLET BY MOUTH ONCE DAILY 08/21/17   Herminio Commons, MD  isosorbide mononitrate (IMDUR) 30 MG 24 hr tablet Take 0.5 tablets (15 mg total) by mouth daily. 01/01/18   Sinda Du, MD  Melatonin 5 MG TABS Take 10 mg by mouth at bedtime.    [provider]  Multiple Minerals-Vitamins (CITRACAL PLUS PO) Take 1 tablet by mouth daily.    [provider]  nitroGLYCERIN (NITROSTAT) 0.4 MG SL tablet Place 1 tablet (0.4 mg total) under the tongue every 5 (five) minutes as needed for chest pain. 01/01/18   Sinda Du, MD  ondansetron (ZOFRAN) 4 MG tablet Take 4 mg by mouth every 8 (eight) hours as needed  for nausea or vomiting. 12/10/20   [provider]  phenol (CHLORASEPTIC) 1.4 % LIQD Use as directed 1 spray in the mouth or throat as needed for throat irritation / pain. 03/23/21   Bonnielee Haff, MD  polyethylene glycol (MIRALAX / GLYCOLAX) 17 g packet Take 17 g by mouth 2 (two) times daily. 03/23/21   Bonnielee Haff, MD  rOPINIRole (REQUIP) 2 MG tablet Take 2 mg by mouth at bedtime. 02/23/21   [provider]  senna-docusate (SENOKOT-S) 8.6-50 MG tablet Take 2 tablets by mouth 2 (two) times daily. 03/23/21   Bonnielee Haff, MD  tamsulosin (FLOMAX) 0.4 MG CAPS capsule Take 0.4 mg by mouth daily. 03/08/21   [provider]    Allergies    Patient has no known allergies.  Review of Systems   Review of Systems  Reason unable to perform ROS: delirium.  Constitutional:  Positive for fever.  Respiratory:  Positive for cough and shortness of breath.    Physical Exam Updated Vital Signs BP (!) 118/46   Pulse (!) 55   Temp 98.6 F (37 C) (Oral)   Resp (!) 26   Ht 5\' 2"  (1.575 m)   SpO2 98%   BMI 25.93 kg/m   Physical Exam Vitals and nursing note reviewed.  Constitutional:      Appearance: Normal appearance. She is well-developed. She is ill-appearing.  HENT:     Head: Normocephalic and atraumatic.     Right Ear: External ear normal.     Left Ear: External ear normal.     Nose: Nose normal.     Mouth/Throat:     Mouth: Mucous membranes are moist.  Eyes:     General: No scleral icterus.       Right eye: No discharge.        Left eye: No discharge.  Cardiovascular:     Rate and Rhythm: Normal rate and regular rhythm.     Pulses: Normal pulses.     Heart sounds: Normal heart sounds.  Pulmonary:     Effort: Pulmonary effort is normal. Tachypnea present. No respiratory distress.     Breath sounds: Decreased breath sounds and wheezing present.  Abdominal:     General: Abdomen is flat.     Tenderness: There is no abdominal tenderness.  Musculoskeletal:         General: Normal range of motion.     Cervical back: Normal range of motion.     Right lower leg: No edema.     Left lower leg: No edema.  Skin:    General: Skin is warm and dry.     Capillary Refill: Capillary refill takes less than 2 seconds.  Neurological:     Mental Status: She is alert and oriented to person, place, and time.     GCS: GCS eye subscore is 4. GCS verbal subscore is 5. GCS motor subscore is 6.  Psychiatric:        Mood and Affect: Mood normal.        Behavior: Behavior normal.    ED Results / Procedures / Treatments   Labs (all labs ordered are listed, but only abnormal results are displayed) Labs Reviewed  RESP PANEL BY RT-PCR (FLU A&B, COVID) ARPGX2 - Abnormal; Notable for the following components:      Result Value   SARS Coronavirus 2 by RT PCR POSITIVE (*)    All other components within normal limits  COMPREHENSIVE METABOLIC PANEL - Abnormal; Notable for the following components:   Calcium 8.5 (*)    GFR, Estimated 60 (*)    All other components within normal limits  CBC WITH DIFFERENTIAL/PLATELET - Abnormal; Notable for the following components:   WBC 11.0 (*)    RBC 3.81 (*)    Hemoglobin 11.7 (*)    Eosinophils Absolute 0.6 (*)    All other components within normal limits  PROTIME-INR - Abnormal; Notable for the following components:   Prothrombin Time 15.5 (*)    All other components within normal limits  BRAIN NATRIURETIC PEPTIDE - Abnormal; Notable for the following components:   B Natriuretic Peptide 529.0 (*)    All other components within normal limits  BLOOD GAS, VENOUS - Abnormal; Notable for the following components:   pO2, Ven 48.5 (*)    Acid-Base Excess 2.7 (*)  All other components within normal limits  SALICYLATE LEVEL - Abnormal; Notable for the following components:   Salicylate Lvl <6.5 (*)    All other components within normal limits  CULTURE, BLOOD (ROUTINE X 2)  CULTURE, BLOOD (ROUTINE X 2)  LACTIC ACID, PLASMA   APTT  AMMONIA  LACTIC ACID, PLASMA  URINALYSIS, ROUTINE W REFLEX MICROSCOPIC  OSMOLALITY  PROCALCITONIN  TROPONIN I (HIGH SENSITIVITY)  TROPONIN I (HIGH SENSITIVITY)    EKG EKG Interpretation  Date/Time:  Thursday May 06 2021 07:57:39 EDT Ventricular Rate:  72 PR Interval:    QRS Duration: 94 QT Interval:  431 QTC Calculation: 482 R Axis:   75 Text Interpretation: Atrial fibrillation Borderline low voltage, extremity leads Similar to prior tracings Confirmed by Wynona Dove ([id number]) on 05/06/2021 9:17:54 AM  Radiology DG Chest 1 View  Result Date: 05/06/2021 CLINICAL DATA:  Shortness of breath, altered mental status EXAM: CHEST  1 VIEW COMPARISON:  Chest radiograph 03/20/2021 FINDINGS: The cardiomediastinal silhouette is stable. There is calcified atherosclerotic plaque of the aortic arch. There is new consolidative retrocardiac opacity as well as patchy opacity in the left midlung. There is a small left and suspected trace right pleural effusion. There is no focal consolidation on the right. There is no pneumothorax. There is degenerative change of both shoulders. There is no acute osseous abnormality. IMPRESSION: Consolidative retrocardiac opacity and patchy opacity in the left mid lung suspicious for pneumonia with a small left parapneumonic effusion. Recommend follow-up radiographs to resolution. Electronically Signed   By: Valetta Mole M.D.   On: 05/06/2021 08:48   CT HEAD WO CONTRAST (5MM)  Result Date: 05/06/2021 CLINICAL DATA:  Delirium, altered mental status EXAM: CT HEAD WITHOUT CONTRAST TECHNIQUE: Contiguous axial images were obtained from the base of the skull through the vertex without intravenous contrast. COMPARISON:  CT head 12/27/2017 FINDINGS: Brain: There is no evidence of acute intracranial hemorrhage, extra-axial fluid collection, or acute infarct. There is global parenchymal volume loss with commensurate enlargement of the ventricular system and CSF spaces.  Confluent hypodensity in the subcortical and periventricular white matter likely reflects sequela of chronic white matter microangiopathy. There is no mass lesion. There is no midline shift. Vascular: There is calcification of the bilateral cavernous ICAs. Skull: Normal. Negative for fracture or focal lesion. Sinuses/Orbits: There is complete opacification of the sphenoid sinuses with associated hyperostosis. There is layering fluid in the right sphenoid sinus. Bilateral lens implants are in place. The globes and orbits are otherwise unremarkable. Other: None IMPRESSION: 1. No acute intracranial pathology. 2. Global parenchymal volume loss and chronic white matter microangiopathy. 3. Chronic bilateral sphenoid sinusitis with layering fluid in the right sphenoid sinus which could reflect superimposed acute sinusitis in the correct clinical setting. Electronically Signed   By: Valetta Mole M.D.   On: 05/06/2021 08:40    Procedures .Critical Care Performed by: Jeanell Sparrow, DO Authorized by: Jeanell Sparrow, DO   Critical care provider statement:    Critical care time (minutes):  39   Critical care time was exclusive of:  Separately billable procedures and treating other patients   Critical care was necessary to treat or prevent imminent or life-threatening deterioration of the following conditions:  Respiratory failure   Critical care was time spent personally by me on the following activities:  Discussions with consultants, evaluation of patient's response to treatment, examination of patient, ordering and performing treatments and interventions, ordering and review of laboratory studies, ordering and review of radiographic studies,  pulse oximetry, re-evaluation of patient's condition, obtaining history from patient or surrogate and review of old charts   Care discussed with: admitting provider     Medications Ordered in ED Medications  ipratropium-albuterol (DUONEB) 0.5-2.5 (3) MG/3ML nebulizer  solution 3 mL (has no administration in time range)  azithromycin (ZITHROMAX) 500 mg in sodium chloride 0.9 % 250 mL IVPB (has no administration in time range)  0.9 %  sodium chloride infusion (has no administration in time range)  cefTRIAXone (ROCEPHIN) 1 g in sodium chloride 0.9 % 100 mL IVPB (has no administration in time range)  azithromycin (ZITHROMAX) 500 mg in sodium chloride 0.9 % 250 mL IVPB (has no administration in time range)  guaiFENesin (MUCINEX) 12 hr tablet 600 mg (has no administration in time range)  ipratropium-albuterol (DUONEB) 0.5-2.5 (3) MG/3ML nebulizer solution 3 mL (has no administration in time range)  albuterol (PROVENTIL) (2.5 MG/3ML) 0.083% nebulizer solution 2.5 mg (has no administration in time range)  cefTRIAXone (ROCEPHIN) 2 g in sodium chloride 0.9 % 100 mL IVPB (2 g Intravenous New Bag/Given 05/06/21 0853)  methylPREDNISolone sodium succinate (SOLU-MEDROL) 125 mg/2 mL injection 60 mg (60 mg Intravenous Given 05/06/21 0852)  pantoprazole (PROTONIX) injection 40 mg (40 mg Intravenous Given 05/06/21 0852)  lactated ringers bolus 500 mL (500 mLs Intravenous New Bag/Given 05/06/21 6948)    ED Course  I have reviewed the triage vital signs and the nursing notes.  Pertinent labs & imaging results that were available during my care of the patient were reviewed by me and considered in my medical decision making (see chart for details).    MDM Rules/Calculators/A&P                         This patient complains of dyspnea, AMS; this involves an extensive number of treatment options and is a complaint that carries with it a high risk of complications and morbidity. Vital signs reviewed. Patient unable to tolerate room air, she will de-saturate to upper 80's/low 90's% pulse ox. Continue 4L [place]. No home oxygen use. Serious etiologies considered.   I ordered, reviewed and interpreted labs. Mild leukocytosis on CBC. Elevated BNP.  I ordered imaging studies which included  CXR, CTH and I independently visualized and interpreted imaging which showed likely pneumonia on CXR. CTH unremarkable.  Previous records obtained and reviewed   Patient with acute respiratory failure requiring supplemental oxygen.  Likely community-acquired pneumonia.  Blood culture sent.  Started on Rocephin and azithromycin.  Procalcitonin also sent.  BNP mildly elevated 529.  Mild lower extremity edema.  Very small effusion on chest x-ray.  Doubt acute fluid overload at this time as etiology of patient's respiratory distress. She Na WNL and no elevation to her troponin.   I consulted hospitalist team and discussed lab and imaging findings, admission accepted.    Unable to reach family/DPOA, voicemail left requesting callback. Pt arrived with DNR paperwork from nursing facility  Final Clinical Impression(s) / ED Diagnoses Final diagnoses:  Encephalopathy  Community acquired pneumonia, unspecified laterality  Hypoxia    Rx / DC Orders ED Discharge Orders     None        Jeanell Sparrow, DO 05/06/21 [phone]

## 2021-05-06 NOTE — H&P (Signed)
Patient Demographics:    Susan Glass, is a 87 y.o. female  MRN: [id number]   DOB - 08/18/1916  Admit Date - 05/06/2021  Outpatient Primary MD for the patient is Caprice Renshaw, MD   Assessment & Plan:    Principal Problem:   Acute respiratory failure with hypoxia due to Covid PNA Active Problems:   COVID-19 virus infection   Pneumonia   Acute lower UTI   Essential hypertension   Hyperlipidemia    1)Acute hypoxic respiratory failure secondary to COVID-19 infection/Pneumonia---  -Currently requiring 4 L of oxygen via nasal cannula The treatment plan and use of medications  for treatment of COVID-19 infection and possible side effects were discussed with patient/daughter in Law [name] -----Patient and daughter in [place] [name] verbalizes understanding and agrees to treatment protocols   --Patient is positive for COVID-19 infection, chest x-ray with findings of infiltrates/opacities,  patient is tachypneic/hypoxic and requiring continuous supplemental oxygen---patient meets criteria for initiation of Remdesivir AND Steroid therapy per protocol  --Check and trend inflammatory markers including D-dimer, ferritin and  CRP---also follow CBC and CMP --Supplemental oxygen to keep O2 sats above 93% -Follow serial chest x-rays and ABGs as indicated --- Encourage prone positioning for More than 16 hours/day in increments of 2 to 3 hours at a time if able to tolerate --Attempt to maintain euvolemic state --Zinc and vitamin C as ordered -Albuterol inhaler as needed -Accu-Cheks/fingersticks while on high-dose steroids PPI for GI prophylaxis while on high-dose steroids  2)Social/Ethics--- patient's only son who is 36 years old passed away from [place] infection on 05/18/21 -Patient wishes to be a DNR/DNI with  full scope of treatment -This was verified with patient's daughter in [place] [name]  3)Sepsis secondary to presumed  UTI--UA suspicious for UTI,  POA--- on admission patient had tachycardia and tachypnea with hypoxia IV Rocephin as ordered pending culture data -WBC 11, lactic acid not elevated, PCT-0.19  4)HTN--continue Amlodipine 5 mg daily, labetalol IV as needed elevated BP  5)Grief/Anxiety/Adjustment Disorder due to death of patient's son--patient 87 year old son died from [place] infection on May 18, 2010 -Lorazepam as needed  6)H/o CAD/H/o HFpEF/chronic diastolic dysfunction CHF--- appears compensated -Echo from 04/03/2021 with EF of 50 to 55% -BNP noted -Troponin negative -No ACS type symptoms  7) CKD 3A---- renal function appears close to baseline - renally adjust medications, avoid nephrotoxic agents / dehydration  / hypotension  8) chronic iron deficiency anemia----Hgb 11.7, which is close to patient's baseline, no evidence of ongoing bleeding, continue iron supplementation  Disposition/Need for in-Hospital Stay- patient unable to be discharged at this time due to --acute Hypoxic Resp Failure due to covid PNA and UTI-requiring IV remdesivir, IV steroids supplemental oxygen as well as IV Rocephin for UTI  Status is: Inpatient  Remains inpatient appropriate because: Please see disposition above  Dispo: The patient is from: SNF  Anticipated d/c is to: SNF  [name]              Anticipated d/c date is: > 3 days              Patient currently is not medically stable to d/c. Barriers: Not Clinically Stable-    With History of - Reviewed by me  Past Medical History:  Diagnosis Date   CAD (coronary artery disease)    CHF (congestive heart failure) (HCC)    GERD (gastroesophageal reflux disease)    GI bleed    UGI   Hiatal hernia    large, sliding    Hyperlipidemia    hypercholesterolemia   Hypertension    IDA (iron deficiency anemia)       Past  Surgical History:  Procedure Laterality Date   APPENDECTOMY     CHOLECYSTECTOMY     COLONOSCOPY  2008   Dr. Gala Romney: Hyperplastic polyp removed, oozing erosion at the ileocecal valve with benign pathology, likely NSAID related.   ERCP N/A 03/22/2021   Procedure: ENDOSCOPIC RETROGRADE CHOLANGIOPANCREATOGRAPHY (ERCP);  Surgeon: Rogene Houston, MD;  Location: [facility];  Service: Gastroenterology;  Laterality: N/A;   ESOPHAGEAL DILATION N/A 11/28/2014   Procedure: ESOPHAGEAL DILATION;  Surgeon: Danie Binder, MD;  Location: [facility];  Service: Endoscopy;  Laterality: N/A;   ESOPHAGOGASTRODUODENOSCOPY N/A 11/28/2014   Dr.Fields- UGI bleed d/t severe distal erosive esophagitis in the setting of GE JXN stricture, large sliding hiatal hernia, mild non-erosive gastritis   SPHINCTEROTOMY N/A 03/22/2021   Procedure: SPHINCTEROTOMY;  Surgeon: Rogene Houston, MD;  Location: [facility];  Service: Gastroenterology;  Laterality: N/A;      Chief Complaint  Patient presents with   Altered Mental Status   Shortness of Breath      HPI:    Susan Glass  is a 87 y.o. female with past medical history relevant for chronic anemia of iron deficiency, HTN, HFpEF/chronic diastolic dysfunction CHF and history of CAD who presents from [facility] nursing home with fevers, cough, congestion and respiratory distress with shortness of breath over the last 24 to 48 hours -In the ED patient was found to be hypoxic requiring up to 4 L of oxygen -Chest x-ray was suggestive of pneumonia COVID-19 test is positive - Patient's 59 year old son died on May 15, 2021 from COVID-pneumonia however he has not been around patient for at least the last couple of months - -Additional history obtained from patient's daughter-in-law [name]  No Nausea, Vomiting or Diarrhea  -- In the ED UA suggestive of UTI, PCT 0.19 -Inflammatory markers requested and pending lactic acid not elevated -Troponin is not elevated, BNP is higher  than prior baseline    Review of systems:    In addition to the HPI above,   A full Review of  Systems was done, all other systems reviewed are negative except as noted above in HPI , .    Social History:  Reviewed by me    Social History   Tobacco Use   Smoking status: Never   Smokeless tobacco: Never   Tobacco comments:    Never smoked  Substance Use Topics   Alcohol use: No    Alcohol/week: 0.0 standard drinks       Family History :  Reviewed by me    Family History  Problem Relation Age of Onset   Hypertension Mother     Home Medications:   Prior to Admission medications   Medication Sig  Start Date End Date Taking? Authorizing Provider  acetaminophen (TYLENOL) 500 MG tablet Take 650 mg by mouth every 8 (eight) hours as needed for mild pain, moderate pain, headache or fever.   Yes [provider]  amLODipine (NORVASC) 5 MG tablet Take 5 mg by mouth daily. 03/23/21  Yes [provider]  aspirin 81 MG chewable tablet Chew 81 mg by mouth daily.    Yes [provider]  calcium carbonate (OSCAL) 1500 (600 Ca) MG TABS tablet Take 600 mg of elemental calcium by mouth daily with breakfast.   Yes [provider]  Cholecalciferol (VITAMIN D-3) 125 MCG (5000 UT) TABS Take 1 tablet by mouth daily.   Yes [provider]  Cranberry 450 MG TABS Take 1 tablet by mouth in the morning and at bedtime.   Yes [provider]  divalproex (DEPAKOTE) 125 MG DR tablet Take 125 mg by mouth 2 (two) times daily. 05/05/21  Yes [provider]  famotidine (PEPCID) 20 MG tablet Take 20 mg by mouth 2 (two) times daily.   Yes [provider]  ferrous sulfate (FERROUSUL) 325 (65 FE) MG tablet Take 1 tablet (325 mg total) by mouth daily with breakfast. 11/29/14  Yes Sinda Du, MD  furosemide (LASIX) 80 MG tablet TAKE ONE-HALF TABLET BY MOUTH ONCE DAILY 08/21/17  Yes Herminio Commons, MD  isosorbide mononitrate (IMDUR) 30 MG  24 hr tablet Take 0.5 tablets (15 mg total) by mouth daily. 01/01/18  Yes Sinda Du, MD  LORazepam (ATIVAN) 0.5 MG tablet Take 0.5 mg by mouth every 6 (six) hours as needed for anxiety.   Yes [provider]  Melatonin 5 MG TABS Take 10 mg by mouth at bedtime.   Yes [provider]  nitroGLYCERIN (NITROSTAT) 0.4 MG SL tablet Place 1 tablet (0.4 mg total) under the tongue every 5 (five) minutes as needed for chest pain. 01/01/18  Yes Sinda Du, MD  ondansetron (ZOFRAN) 4 MG tablet Take 4 mg by mouth every 8 (eight) hours as needed for nausea or vomiting. 12/10/20  Yes [provider]  phenol (CHLORASEPTIC) 1.4 % LIQD Use as directed 1 spray in the mouth or throat as needed for throat irritation / pain. 03/23/21  Yes Bonnielee Haff, MD  polyethylene glycol (MIRALAX / GLYCOLAX) 17 g packet Take 17 g by mouth 2 (two) times daily. Patient taking differently: Take 17 g by mouth daily. 03/23/21  Yes Bonnielee Haff, MD  rOPINIRole (REQUIP) 2 MG tablet Take 2 mg by mouth at bedtime. 02/23/21  Yes [provider]  senna-docusate (SENOKOT-S) 8.6-50 MG tablet Take 2 tablets by mouth 2 (two) times daily. 03/23/21  Yes Bonnielee Haff, MD  tamsulosin (FLOMAX) 0.4 MG CAPS capsule Take 0.4 mg by mouth daily. 03/08/21  Yes [provider]  aMILoride (MIDAMOR) 5 MG tablet Take 5 mg by mouth daily. Patient not taking: Reported on 05/06/2021 03/15/21   [provider]  Multiple Minerals-Vitamins (CITRACAL PLUS PO) Take 1 tablet by mouth daily. Patient not taking: Reported on 05/06/2021    [provider]     Allergies:    No Known Allergies   Physical Exam:   Vitals  Blood pressure (!) 96/56, pulse (!) 51, temperature 98.6 F (37 C), temperature source Oral, resp. rate (!) 22, height 5\' 2"  (1.575 m), weight 70.3 kg, SpO2 95 %.  Physical Examination: General appearance - alert, with some respiratory distress  mental status - alert, oriented to  person, place, and  time,  Nose- [place] 4L/min Eyes - sclera anicteric Neck - supple, no JVD elevation , Chest -diminished breath sounds with scattered wheezes , tachypneic heart - S1 and S2 normal, regular , tachycardic Abdomen - soft, nontender, nondistended, no masses or organomegaly Neurological - screening mental status exam normal, neck supple without rigidity, cranial nerves II through XII intact, DTR's normal and symmetric Extremities - no pedal edema noted, intact peripheral pulses  Skin - warm, dry     Data Review:    CBC Recent Labs  Lab 05/06/21 0745  WBC 11.0*  HGB 11.7*  HCT 36.5  PLT 185  MCV 95.8  MCH 30.7  MCHC 32.1  RDW 14.7  LYMPHSABS 2.0  MONOABS 0.8  EOSABS 0.6*  BASOSABS 0.0   ------------------------------------------------------------------------------------------------------------------  Chemistries  Recent Labs  Lab 05/06/21 0745  NA 137  K 4.2  CL 105  CO2 26  GLUCOSE 95  BUN 18  CREATININE 0.86  CALCIUM 8.5*  AST 15  ALT 13  ALKPHOS 66  BILITOT 0.6   ------------------------------------------------------------------------------------------------------------------ estimated creatinine clearance is 30.4 mL/min (by C-G formula based on SCr of 0.86 mg/dL). ------------------------------------------------------------------------------------------------------------------ No results for input(s): TSH, T4TOTAL, T3FREE, THYROIDAB in the last 72 hours.  Invalid input(s): FREET3   Coagulation profile Recent Labs  Lab 05/06/21 0745  INR 1.2   ------------------------------------------------------------------------------------------------------------------- No results for input(s): DDIMER in the last 72 hours. -------------------------------------------------------------------------------------------------------------------  Cardiac Enzymes No results for input(s): CKMB, TROPONINI, MYOGLOBIN in the last 168 hours.  Invalid input(s):  CK ------------------------------------------------------------------------------------------------------------------    Component Value Date/Time   BNP 529.0 (H) 05/06/2021 0746     ---------------------------------------------------------------------------------------------------------------  Urinalysis    Component Value Date/Time   COLORURINE YELLOW 05/06/2021 1527   APPEARANCEUR CLEAR 05/06/2021 1527   LABSPEC >1.030 (H) 05/06/2021 1527   PHURINE 5.5 05/06/2021 1527   GLUCOSEU 100 (A) 05/06/2021 1527   HGBUR NEGATIVE 05/06/2021 1527   [place] 05/06/2021 1527   KETONESUR NEGATIVE 05/06/2021 1527   PROTEINUR TRACE (A) 05/06/2021 1527   NITRITE POSITIVE (A) 05/06/2021 1527   LEUKOCYTESUR SMALL (A) 05/06/2021 1527    ----------------------------------------------------------------------------------------------------------------   Imaging Results:    DG Chest 1 View  Result Date: 05/06/2021 CLINICAL DATA:  Shortness of breath, altered mental status EXAM: CHEST  1 VIEW COMPARISON:  Chest radiograph 03/20/2021 FINDINGS: The cardiomediastinal silhouette is stable. There is calcified atherosclerotic plaque of the aortic arch. There is new consolidative retrocardiac opacity as well as patchy opacity in the left midlung. There is a small left and suspected trace right pleural effusion. There is no focal consolidation on the right. There is no pneumothorax. There is degenerative change of both shoulders. There is no acute osseous abnormality. IMPRESSION: Consolidative retrocardiac opacity and patchy opacity in the left mid lung suspicious for pneumonia with a small left parapneumonic effusion. Recommend follow-up radiographs to resolution. Electronically Signed   By: Valetta Mole M.D.   On: 05/06/2021 08:48   CT HEAD WO CONTRAST (5MM)  Result Date: 05/06/2021 CLINICAL DATA:  Delirium, altered mental status EXAM: CT HEAD WITHOUT CONTRAST TECHNIQUE: Contiguous axial images  were obtained from the base of the skull through the vertex without intravenous contrast. COMPARISON:  CT head 12/27/2017 FINDINGS: Brain: There is no evidence of acute intracranial hemorrhage, extra-axial fluid collection, or acute infarct. There is global parenchymal volume loss with commensurate enlargement of the ventricular system and CSF spaces. Confluent hypodensity in the subcortical and periventricular white matter likely reflects sequela of chronic white matter microangiopathy. There  is no mass lesion. There is no midline shift. Vascular: There is calcification of the bilateral cavernous ICAs. Skull: Normal. Negative for fracture or focal lesion. Sinuses/Orbits: There is complete opacification of the sphenoid sinuses with associated hyperostosis. There is layering fluid in the right sphenoid sinus. Bilateral lens implants are in place. The globes and orbits are otherwise unremarkable. Other: None IMPRESSION: 1. No acute intracranial pathology. 2. Global parenchymal volume loss and chronic white matter microangiopathy. 3. Chronic bilateral sphenoid sinusitis with layering fluid in the right sphenoid sinus which could reflect superimposed acute sinusitis in the correct clinical setting. Electronically Signed   By: Valetta Mole M.D.   On: 05/06/2021 08:40    Radiological Exams on Admission: DG Chest 1 View  Result Date: 05/06/2021 CLINICAL DATA:  Shortness of breath, altered mental status EXAM: CHEST  1 VIEW COMPARISON:  Chest radiograph 03/20/2021 FINDINGS: The cardiomediastinal silhouette is stable. There is calcified atherosclerotic plaque of the aortic arch. There is new consolidative retrocardiac opacity as well as patchy opacity in the left midlung. There is a small left and suspected trace right pleural effusion. There is no focal consolidation on the right. There is no pneumothorax. There is degenerative change of both shoulders. There is no acute osseous abnormality. IMPRESSION: Consolidative  retrocardiac opacity and patchy opacity in the left mid lung suspicious for pneumonia with a small left parapneumonic effusion. Recommend follow-up radiographs to resolution. Electronically Signed   By: Valetta Mole M.D.   On: 05/06/2021 08:48   CT HEAD WO CONTRAST (5MM)  Result Date: 05/06/2021 CLINICAL DATA:  Delirium, altered mental status EXAM: CT HEAD WITHOUT CONTRAST TECHNIQUE: Contiguous axial images were obtained from the base of the skull through the vertex without intravenous contrast. COMPARISON:  CT head 12/27/2017 FINDINGS: Brain: There is no evidence of acute intracranial hemorrhage, extra-axial fluid collection, or acute infarct. There is global parenchymal volume loss with commensurate enlargement of the ventricular system and CSF spaces. Confluent hypodensity in the subcortical and periventricular white matter likely reflects sequela of chronic white matter microangiopathy. There is no mass lesion. There is no midline shift. Vascular: There is calcification of the bilateral cavernous ICAs. Skull: Normal. Negative for fracture or focal lesion. Sinuses/Orbits: There is complete opacification of the sphenoid sinuses with associated hyperostosis. There is layering fluid in the right sphenoid sinus. Bilateral lens implants are in place. The globes and orbits are otherwise unremarkable. Other: None IMPRESSION: 1. No acute intracranial pathology. 2. Global parenchymal volume loss and chronic white matter microangiopathy. 3. Chronic bilateral sphenoid sinusitis with layering fluid in the right sphenoid sinus which could reflect superimposed acute sinusitis in the correct clinical setting. Electronically Signed   By: Valetta Mole M.D.   On: 05/06/2021 08:40    DVT Prophylaxis -SCD  /lovenox AM Labs Ordered, also please review Full Orders  Family Communication: Admission, patients condition and plan of care including tests being ordered have been discussed with the patient and daughter in-law who  indicate understanding and agree with the plan   Code Status - Full Code  Likely DC to  [facility]   Condition   stable  Roxan Hockey M.D on 05/06/2021 at 5:50 PM Go to [email or web address] -  for contact info  [facility] - Office  [phone]

## 2021-05-06 NOTE — ED Triage Notes (Signed)
Arrived by EMS; Pt started having difficulty breathing and AMS;pt baseline is a/o x4; recently given ativan last night

## 2021-05-07 DIAGNOSIS — J9601 Acute respiratory failure with hypoxia: Secondary | ICD-10-CM | POA: Diagnosis not present

## 2021-05-07 LAB — CBC WITH DIFFERENTIAL/PLATELET
Abs Immature Granulocytes: 0.11 10*3/uL — ABNORMAL HIGH (ref 0.00–0.07)
Basophils Absolute: 0 10*3/uL (ref 0.0–0.1)
Basophils Relative: 0 %
Eosinophils Absolute: 0 10*3/uL (ref 0.0–0.5)
Eosinophils Relative: 0 %
HCT: 36.5 % (ref 36.0–46.0)
Hemoglobin: 11.4 g/dL — ABNORMAL LOW (ref 12.0–15.0)
Immature Granulocytes: 2 %
Lymphocytes Relative: 5 %
Lymphs Abs: 0.4 10*3/uL — ABNORMAL LOW (ref 0.7–4.0)
MCH: 29.5 pg (ref 26.0–34.0)
MCHC: 31.2 g/dL (ref 30.0–36.0)
MCV: 94.3 fL (ref 80.0–100.0)
Monocytes Absolute: 0.2 10*3/uL (ref 0.1–1.0)
Monocytes Relative: 3 %
Neutro Abs: 6.7 10*3/uL (ref 1.7–7.7)
Neutrophils Relative %: 90 %
Platelets: 177 10*3/uL (ref 150–400)
RBC: 3.87 MIL/uL (ref 3.87–5.11)
RDW: 14.4 % (ref 11.5–15.5)
WBC: 7.4 10*3/uL (ref 4.0–10.5)
nRBC: 0 % (ref 0.0–0.2)

## 2021-05-07 LAB — COMPREHENSIVE METABOLIC PANEL
ALT: 48 U/L — ABNORMAL HIGH (ref 0–44)
AST: 64 U/L — ABNORMAL HIGH (ref 15–41)
Albumin: 3.6 g/dL (ref 3.5–5.0)
Alkaline Phosphatase: 78 U/L (ref 38–126)
Anion gap: 11 (ref 5–15)
BUN: 18 mg/dL (ref 8–23)
CO2: 22 mmol/L (ref 22–32)
Calcium: 8.7 mg/dL — ABNORMAL LOW (ref 8.9–10.3)
Chloride: 106 mmol/L (ref 98–111)
Creatinine, Ser: 0.74 mg/dL (ref 0.44–1.00)
GFR, Estimated: >60 mL/min (ref >60)
Glucose, Bld: 176 mg/dL — ABNORMAL HIGH (ref 70–99)
Potassium: 4.2 mmol/L (ref 3.5–5.1)
Sodium: 139 mmol/L (ref 135–145)
Total Bilirubin: 0.3 mg/dL (ref 0.3–1.2)
Total Protein: 6.6 g/dL (ref 6.5–8.1)

## 2021-05-07 LAB — C-REACTIVE PROTEIN: CRP: 3.3 mg/dL — ABNORMAL HIGH (ref <1.0)

## 2021-05-07 LAB — D-DIMER, QUANTITATIVE: D-Dimer, Quant: 1.42 ug/mL-FEU — ABNORMAL HIGH (ref 0.00–0.50)

## 2021-05-07 LAB — MAGNESIUM: Magnesium: 2.3 mg/dL (ref 1.7–2.4)

## 2021-05-07 LAB — FERRITIN: Ferritin: 101 ng/mL (ref 11–307)

## 2021-05-07 LAB — PHOSPHORUS: Phosphorus: 3.3 mg/dL (ref 2.5–4.6)

## 2021-05-07 MED ORDER — HALOPERIDOL LACTATE 5 MG/ML IJ SOLN
2.0000 mg | Freq: Once | INTRAMUSCULAR | Status: AC
  Administered 2021-05-07: 2 mg via INTRAMUSCULAR
  Filled 2021-05-07: qty 1

## 2021-05-07 MED ORDER — ALBUTEROL SULFATE (2.5 MG/3ML) 0.083% IN NEBU
INHALATION_SOLUTION | RESPIRATORY_TRACT | Status: AC
  Filled 2021-05-07: qty 3

## 2021-05-07 NOTE — Progress Notes (Signed)
Patient Demographics:    Susan Glass, is a 87 y.o. female, DOB - 11-18-1916, WIO:[id number]  Admit date - 05/06/2021   Admitting Physician Kenadee Gates Denton Brick, MD  Outpatient Primary MD for the patient is Caprice Renshaw, MD  LOS - 1   Chief Complaint  Patient presents with   Altered Mental Status   Shortness of Breath        Subjective:    Susan Glass today has no fevers, no emesis,  No chest pain,  -Cough and shortness of breath is not worse -oral intake is not great   Assessment  & Plan :    Principal Problem:   Acute respiratory failure with hypoxia due to Covid PNA Active Problems:   COVID-19 virus infection   Pneumonia   Acute lower UTI   Essential hypertension   Hyperlipidemia  Brief Summary:- 87 y.o. female with past medical history relevant for chronic anemia of iron deficiency, HTN, HFpEF/chronic diastolic dysfunction CHF and history of CAD who admitted from [facility] on 12/09/8339 with acute hypoxic respiratory failure secondary to COVID-pneumonia as well as found to have UTI   1)Acute hypoxic respiratory failure secondary to COVID-19 infection/Pneumonia---  -Currently requiring 2 L of oxygen via nasal cannula The treatment plan and use of medications  for treatment of COVID-19 infection and possible side effects were discussed with patient/daughter in Law Ms Susan Glass -----Patient and daughter in [place] Ms Susan Glass verbalizes understanding and agrees to treatment protocols   --Patient is positive for COVID-19 infection, chest x-ray with findings of infiltrates/opacities,  patient is tachypneic/hypoxic and requiring continuous supplemental oxygen---patient meets criteria for initiation of Remdesivir AND Steroid therapy per protocol  --Check and trend inflammatory markers including D-dimer, ferritin and  CRP---also follow CBC and CMP --Supplemental oxygen to keep O2 sats above  93% -Follow serial chest x-rays and ABGs as indicated --- Encourage prone positioning for More than 16 hours/day in increments of 2 to 3 hours at a time if able to tolerate --Attempt to maintain euvolemic state --Zinc and vitamin C as ordered -Albuterol inhaler as needed -Accu-Cheks/fingersticks while on high-dose steroids PPI for GI prophylaxis while on high-dose steroids COVID-19 Labs  Recent Labs    05/07/21 0550  DDIMER 1.42*  FERRITIN 101  CRP 3.3*    Lab Results  Component Value Date   SARSCOV2NAA POSITIVE (A) 05/06/2021   [place] NEGATIVE 03/18/2021      2)Social/Ethics--- patient's only son who was 40 years old passed away from [place] infection on 28-May-2021 -Patient wishes to be a DNR/DNI with full scope of treatment -This was verified with patient's daughter in [place] Ms Susan Glass   3)Sepsis secondary to presumed  UTI--UA suspicious for UTI,  POA--- on admission patient had tachycardia and tachypnea with hypoxia -Continue IV Rocephin pending urine culture -WBC 11>> 7.4  -lactic acid not elevated, PCT-0.19   4)HTN--continue Amlodipine 5 mg daily, labetalol IV as needed elevated BP   5)Grief/Anxiety/Adjustment Disorder due to death of patient's son--patient 35 year old son died from [place] infection on 05/28/10 -Lorazepam as needed   6)H/o CAD/H/o HFpEF/chronic diastolic dysfunction CHF--- appears compensated -Echo from 04/03/2021 with EF of 50 to 55% -BNP noted -Troponin negative -No ACS type symptoms   7) CKD 3A---- renal function  appears close to baseline - renally adjust medications, avoid nephrotoxic agents / dehydration  / hypotension   8) chronic iron deficiency anemia----Hgb stable above 11,  --which is close to patient's baseline, no evidence of ongoing bleeding, continue iron supplementation   Disposition/Need for in-Hospital Stay- patient unable to be discharged at this time due to --acute Hypoxic Resp Failure due to covid PNA and UTI-requiring IV  remdesivir, IV steroids supplemental oxygen as well as IV Rocephin for UTI   Status is: Inpatient   Remains inpatient appropriate because: Please see disposition above   Dispo: The patient is from: SNF              Anticipated d/c is to: SNF  [name]              Anticipated d/c date is: > 3 days              Patient currently is not medically stable to d/c. Barriers: Not Clinically Stable-     Code Status : -  Code Status: DNR   Family Communication:   patient is alert, awake and coherent) Discussed with daughter in law  Consults  :  na  DVT Prophylaxis  :   - SCDs   SCDs Start: 05/06/21 1733 Place TED hose Start: 05/06/21 1733 enoxaparin (LOVENOX) injection 40 mg Start: 05/06/21 1230 SCDs Start: 05/06/21 1223    Lab Results  Component Value Date   PLT 177 05/07/2021    Inpatient Medications  Scheduled Meds:  amLODipine  5 mg Oral Daily   vitamin C  500 mg Oral Daily   aspirin  81 mg Oral Daily   calcium carbonate  500 mg of elemental calcium Oral Q breakfast   cholecalciferol  5,000 Units Oral Daily   divalproex  125 mg Oral BID   enoxaparin (LOVENOX) injection  40 mg Subcutaneous Q24H   ferrous sulfate  325 mg Oral Q breakfast   folic acid  1 mg Oral Daily   guaiFENesin  600 mg Oral BID   Ipratropium-Albuterol  1 puff Inhalation Q6H   isosorbide mononitrate  15 mg Oral Daily   melatonin  9 mg Oral QHS   methylPREDNISolone (SOLU-MEDROL) injection  0.5 mg/kg Intravenous Q12H   Followed by   Derrill Memo ON 05/09/2021] predniSONE  50 mg Oral Daily   multivitamin with minerals  1 tablet Oral Daily   pantoprazole  40 mg Oral Daily   polyethylene glycol  17 g Oral BID   rOPINIRole  2 mg Oral QHS   senna-docusate  2 tablet Oral BID   sodium chloride flush  3 mL Intravenous Q12H   sodium chloride flush  3 mL Intravenous Q12H   tamsulosin  0.4 mg Oral Daily   thiamine  100 mg Oral Daily   zinc sulfate  220 mg Oral Daily   Continuous Infusions:  sodium chloride      azithromycin 500 mg (05/07/21 1038)   cefTRIAXone (ROCEPHIN)  IV 1 g (05/07/21 0944)   remdesivir 100 mg in NS 100 mL     PRN Meds:.sodium chloride, acetaminophen **OR** acetaminophen, acetaminophen, bisacodyl, chlorpheniramine-HYDROcodone, guaiFENesin-dextromethorphan, labetalol, LORazepam, nitroGLYCERIN, ondansetron **OR** ondansetron (ZOFRAN) IV, ondansetron, phenol, sodium chloride flush, traZODone    Anti-infectives (From admission, onward)    Start     Dose/Rate Route Frequency Ordered Stop   05/07/21 1000  remdesivir 100 mg in sodium chloride 0.9 % 100 mL IVPB  Status:  Discontinued       See Hyperspace for  full Linked Orders Report.   100 mg 200 mL/hr over 30 Minutes Intravenous Daily 05/06/21 1225 05/06/21 1244   05/07/21 1000  remdesivir 100 mg in sodium chloride 0.9 % 100 mL IVPB        100 mg 200 mL/hr over 30 Minutes Intravenous Daily 05/06/21 1246 05/11/21 0959   05/07/21 0930  cefTRIAXone (ROCEPHIN) 1 g in sodium chloride 0.9 % 100 mL IVPB        1 g 200 mL/hr over 30 Minutes Intravenous Every 24 hours 05/06/21 1739     05/06/21 1300  remdesivir 100 mg in sodium chloride 0.9 % 100 mL IVPB        100 mg 200 mL/hr over 30 Minutes Intravenous Every 30 min 05/06/21 1243 05/06/21 1445   05/06/21 1230  remdesivir 200 mg in sodium chloride 0.9% 250 mL IVPB  Status:  Discontinued       See Hyperspace for full Linked Orders Report.   200 mg 580 mL/hr over 30 Minutes Intravenous Once 05/06/21 1225 05/06/21 1245   05/06/21 0930  cefTRIAXone (ROCEPHIN) 1 g in sodium chloride 0.9 % 100 mL IVPB  Status:  Discontinued        1 g 200 mL/hr over 30 Minutes Intravenous Every 24 hours 05/06/21 0916 05/06/21 1739   05/06/21 0930  azithromycin (ZITHROMAX) 500 mg in sodium chloride 0.9 % 250 mL IVPB        500 mg 250 mL/hr over 60 Minutes Intravenous Every 24 hours 05/06/21 0916     05/06/21 0900  azithromycin (ZITHROMAX) 500 mg in sodium chloride 0.9 % 250 mL IVPB  Status:  Discontinued         500 mg 250 mL/hr over 60 Minutes Intravenous Every 24 hours 05/06/21 0852 05/06/21 0948   05/06/21 0800  cefTRIAXone (ROCEPHIN) 2 g in sodium chloride 0.9 % 100 mL IVPB        2 g 200 mL/hr over 30 Minutes Intravenous  Once 05/06/21 0747 05/06/21 0925         Objective:   Vitals:   05/06/21 2034 05/07/21 0101 05/07/21 0552 05/07/21 0753  BP: (!) 148/113 124/75 (!) 147/74 136/63  Pulse: 74 (!) 54 80 73  Resp: 20 20  18   Temp: 97.8 F (36.6 C) 97.6 F (36.4 C) 98.9 F (37.2 C) 97.8 F (36.6 C)  TempSrc: Oral Oral  Oral  SpO2: 99% 96% 93% 96%  Weight:      Height:        Wt Readings from Last 3 Encounters:  05/06/21 70.3 kg  03/19/21 64.3 kg  05/11/18 57.2 kg     Intake/Output Summary (Last 24 hours) at 05/07/2021 1135 Last data filed at 05/07/2021 0500 Gross per 24 hour  Intake --  Output 400 ml  Net -400 ml     Physical Exam  Gen:- Awake Alert,  in no apparent distress  HEENT:- [place].AT, No sclera icterus Nose- [place] 2L/min Neck-Supple Neck,No JVD,.  Lungs-diminished breath sounds, no wheezing  CV- S1, S2 normal, regular  Abd-  +ve B.Sounds, Abd Soft, No tenderness,    Extremity/Skin:- No  edema, pedal pulses present  Psych-affect is anxious, oriented x3 Neuro-generalized weakness, no new focal deficits, no tremors   Data Review:   Micro Results Recent Results (from the past 240 hour(s))  Blood culture (routine x 2)     Status: None (Preliminary result)   Collection Time: 05/06/21  7:33 AM   Specimen: Left Antecubital; Blood  Result Value  Ref Range Status   Specimen Description LEFT ANTECUBITAL  Final   Special Requests   Final    BOTTLES DRAWN AEROBIC AND ANAEROBIC Blood Culture results may not be optimal due to an excessive volume of blood received in culture bottles   Culture   Final    NO GROWTH < 24 HOURS Performed at [facility], [address], [place], [place] [address]    Report Status PENDING  Incomplete  Blood culture (routine x 2)      Status: None (Preliminary result)   Collection Time: 05/06/21  7:35 AM   Specimen: BLOOD LEFT WRIST  Result Value Ref Range Status   Specimen Description BLOOD LEFT WRIST  Final   Special Requests   Final    BOTTLES DRAWN AEROBIC AND ANAEROBIC Blood Culture results may not be optimal due to an excessive volume of blood received in culture bottles   Culture   Final    NO GROWTH < 24 HOURS Performed at [facility], [address], [place], [place] [address]    Report Status PENDING  Incomplete  Resp Panel by RT-PCR (Flu A&B, Covid) Nasopharyngeal Swab     Status: Abnormal   Collection Time: 05/06/21  8:02 AM   Specimen: Nasopharyngeal Swab; Nasopharyngeal(NP) swabs in vial transport medium  Result Value Ref Range Status   SARS Coronavirus 2 by RT PCR POSITIVE (A) NEGATIVE Final    Comment: BOOTH,B AT 0926 ON 9.22.22 BY RUCINSKI,B (NOTE) SARS-CoV-2 target nucleic acids are DETECTED.  The SARS-CoV-2 RNA is generally detectable in upper respiratory specimens during the acute phase of infection. Positive results are indicative of the presence of the identified virus, but do not rule out bacterial infection or co-infection with other pathogens not detected by the test. Clinical correlation with patient history and other diagnostic information is necessary to determine patient infection status. The expected result is Negative.  Fact Sheet for Patients: EntrepreneurPulse.com.au  Fact Sheet for Healthcare Providers: IncredibleEmployment.be  This test is not yet approved or cleared by the [place] FDA and  has been authorized for detection and/or diagnosis of SARS-CoV-2 by FDA under an Emergency Use Authorization (EUA).  This EUA will remain in effect (meaning this test can be used) for the duration of  the COVID-19 de claration under Section 564(b)(1) of the Act, 21 U.S.C. section 360bbb-3(b)(1), unless the authorization is terminated or revoked  sooner.     Influenza A by PCR NEGATIVE NEGATIVE Final   Influenza B by PCR NEGATIVE NEGATIVE Final    Comment: (NOTE) The Xpert Xpress SARS-CoV-2/FLU/RSV plus assay is intended as an aid in the diagnosis of influenza from Nasopharyngeal swab specimens and should not be used as a sole basis for treatment. Nasal washings and aspirates are unacceptable for Xpert Xpress SARS-CoV-2/FLU/RSV testing.  Fact Sheet for Patients: EntrepreneurPulse.com.au  Fact Sheet for Healthcare Providers: IncredibleEmployment.be  This test is not yet approved or cleared by the [place] FDA and has been authorized for detection and/or diagnosis of SARS-CoV-2 by FDA under an Emergency Use Authorization (EUA). This EUA will remain in effect (meaning this test can be used) for the duration of the COVID-19 declaration under Section 564(b)(1) of the Act, 21 U.S.C. section 360bbb-3(b)(1), unless the authorization is terminated or revoked.  Performed at [facility], [address], [place], [place] [address]   MRSA Next Gen by PCR, Nasal     Status: Abnormal   Collection Time: 05/06/21  6:26 PM   Specimen: Nasal Mucosa; Nasal Swab  Result Value Ref Range Status   MRSA by PCR Next Gen DETECTED (A) NOT DETECTED Final    Comment: RESULT CALLED TO, READ BACK BY AND VERIFIED WITH: JUSTIN MIZE RN,2051,05/06/2021, SELF S (NOTE) The GeneXpert MRSA Assay (FDA approved for NASAL specimens only), is one component of a comprehensive MRSA colonization surveillance program. It is not intended to diagnose MRSA infection nor to guide or monitor treatment for MRSA infections. Test performance is not FDA approved in patients less than 59 years old. Performed at [facility], [address] [place], [place] [address]     Radiology Reports DG Chest 1 View  Result Date: 05/06/2021 CLINICAL DATA:  Shortness of breath, altered mental status EXAM: CHEST  1 VIEW COMPARISON:  Chest  radiograph 03/20/2021 FINDINGS: The cardiomediastinal silhouette is stable. There is calcified atherosclerotic plaque of the aortic arch. There is new consolidative retrocardiac opacity as well as patchy opacity in the left midlung. There is a small left and suspected trace right pleural effusion. There is no focal consolidation on the right. There is no pneumothorax. There is degenerative change of both shoulders. There is no acute osseous abnormality. IMPRESSION: Consolidative retrocardiac opacity and patchy opacity in the left mid lung suspicious for pneumonia with a small left parapneumonic effusion. Recommend follow-up radiographs to resolution. Electronically Signed   By: Valetta Mole M.D.   On: 05/06/2021 08:48   CT HEAD WO CONTRAST (5MM)  Result Date: 05/06/2021 CLINICAL DATA:  Delirium, altered mental status EXAM: CT HEAD WITHOUT CONTRAST TECHNIQUE: Contiguous axial images were obtained from the base of the skull through the vertex without intravenous contrast. COMPARISON:  CT head 12/27/2017 FINDINGS: Brain: There is no evidence of acute intracranial hemorrhage, extra-axial fluid collection, or acute infarct. There is global parenchymal volume loss with commensurate enlargement of the ventricular system and CSF spaces. Confluent hypodensity in the subcortical and periventricular white matter likely reflects sequela of chronic white matter microangiopathy. There is no mass lesion. There is no midline shift. Vascular: There is calcification of the bilateral cavernous ICAs. Skull: Normal. Negative for fracture or focal lesion. Sinuses/Orbits: There is complete opacification of the sphenoid sinuses with associated hyperostosis. There is layering fluid in the right sphenoid sinus. Bilateral lens implants are in place. The globes and orbits are otherwise unremarkable. Other: None IMPRESSION: 1. No acute intracranial pathology. 2. Global parenchymal volume loss and chronic white matter microangiopathy. 3.  Chronic bilateral sphenoid sinusitis with layering fluid in the right sphenoid sinus which could reflect superimposed acute sinusitis in the correct clinical setting. Electronically Signed   By: Valetta Mole M.D.   On: 05/06/2021 08:40     CBC Recent Labs  Lab 05/06/21 0745 05/07/21 0550  WBC 11.0* 7.4  HGB 11.7* 11.4*  HCT 36.5 36.5  PLT 185 177  MCV 95.8 94.3  MCH 30.7 29.5  MCHC 32.1 31.2  RDW 14.7 14.4  LYMPHSABS 2.0 0.4*  MONOABS 0.8 0.2  EOSABS 0.6* 0.0  BASOSABS 0.0 0.0    Chemistries  Recent Labs  Lab 05/06/21 0745 05/07/21 0550  NA 137 139  K 4.2 4.2  CL 105 106  CO2 26 22  GLUCOSE 95 176*  BUN 18 18  CREATININE 0.86 0.74  CALCIUM 8.5* 8.7*  MG  --  2.3  AST 15 64*  ALT 13 48*  ALKPHOS 66 78  BILITOT 0.6 0.3   ------------------------------------------------------------------------------------------------------------------ No results for input(s): CHOL, HDL, LDLCALC, TRIG, CHOLHDL, LDLDIRECT in the last 72 hours.  No results found  for: HGBA1C ------------------------------------------------------------------------------------------------------------------ No results for input(s): TSH, T4TOTAL, T3FREE, THYROIDAB in the last 72 hours.  Invalid input(s): FREET3 ------------------------------------------------------------------------------------------------------------------ Recent Labs    05/07/21 0550  FERRITIN 101    Coagulation profile Recent Labs  Lab 05/06/21 0745  INR 1.2    Recent Labs    05/07/21 0550  DDIMER 1.42*    Cardiac Enzymes No results for input(s): CKMB, TROPONINI, MYOGLOBIN in the last 168 hours.  Invalid input(s): CK ------------------------------------------------------------------------------------------------------------------    Component Value Date/Time   BNP 529.0 (H) 05/06/2021 6644   Roxan Hockey M.D on 05/07/2021 at 11:35 AM  Go to [email or web address] - for contact info  [facility] - Office   [phone]

## 2021-05-07 NOTE — Progress Notes (Signed)
Patients monitor alerting asystole. Entered patient room to assess patient. Patient breathing regular rate and rhythm, and is able to wake up when name is called. Patient has no complaints at this time. Tele monitor adjusted, and new stickers placed. Patient states she is comfortable and has no needs at this time.

## 2021-05-07 NOTE — Progress Notes (Signed)
Patient is 87 years old and very confused. Did poorly trying to take combivent for wheezes. Gave neb with albuterol by Blow by. Therapist only person in room with patient. PPE in place.

## 2021-05-07 NOTE — Progress Notes (Signed)
Patient had a coughing spell and appeared SOB with expiratory wheeze. O2 was increased to 2.5L/M as SpO2 was 89 %on 1.5L. She did agree to take her Combivent DPI.

## 2021-05-08 ENCOUNTER — Other Ambulatory Visit: Payer: Self-pay

## 2021-05-08 DIAGNOSIS — J9601 Acute respiratory failure with hypoxia: Secondary | ICD-10-CM | POA: Diagnosis not present

## 2021-05-08 LAB — PHOSPHORUS: Phosphorus: 3.2 mg/dL (ref 2.5–4.6)

## 2021-05-08 LAB — URINE CULTURE

## 2021-05-08 LAB — COMPREHENSIVE METABOLIC PANEL
ALT: 49 U/L — ABNORMAL HIGH (ref 0–44)
AST: 53 U/L — ABNORMAL HIGH (ref 15–41)
Albumin: 3.5 g/dL (ref 3.5–5.0)
Alkaline Phosphatase: 67 U/L (ref 38–126)
Anion gap: 8 (ref 5–15)
BUN: 22 mg/dL (ref 8–23)
CO2: 24 mmol/L (ref 22–32)
Calcium: 9.1 mg/dL (ref 8.9–10.3)
Chloride: 109 mmol/L (ref 98–111)
Creatinine, Ser: 0.71 mg/dL (ref 0.44–1.00)
GFR, Estimated: >60 mL/min (ref >60)
Glucose, Bld: 129 mg/dL — ABNORMAL HIGH (ref 70–99)
Potassium: 4.6 mmol/L (ref 3.5–5.1)
Sodium: 141 mmol/L (ref 135–145)
Total Bilirubin: 0.4 mg/dL (ref 0.3–1.2)
Total Protein: 6.3 g/dL — ABNORMAL LOW (ref 6.5–8.1)

## 2021-05-08 LAB — CBC WITH DIFFERENTIAL/PLATELET
Abs Immature Granulocytes: 0.09 10*3/uL — ABNORMAL HIGH (ref 0.00–0.07)
Basophils Absolute: 0 10*3/uL (ref 0.0–0.1)
Basophils Relative: 0 %
Eosinophils Absolute: 0 10*3/uL (ref 0.0–0.5)
Eosinophils Relative: 0 %
HCT: 36.5 % (ref 36.0–46.0)
Hemoglobin: 11.5 g/dL — ABNORMAL LOW (ref 12.0–15.0)
Immature Granulocytes: 1 %
Lymphocytes Relative: 4 %
Lymphs Abs: 0.5 10*3/uL — ABNORMAL LOW (ref 0.7–4.0)
MCH: 29.7 pg (ref 26.0–34.0)
MCHC: 31.5 g/dL (ref 30.0–36.0)
MCV: 94.3 fL (ref 80.0–100.0)
Monocytes Absolute: 0.6 10*3/uL (ref 0.1–1.0)
Monocytes Relative: 6 %
Neutro Abs: 9.6 10*3/uL — ABNORMAL HIGH (ref 1.7–7.7)
Neutrophils Relative %: 89 %
Platelets: 176 10*3/uL (ref 150–400)
RBC: 3.87 MIL/uL (ref 3.87–5.11)
RDW: 14.7 % (ref 11.5–15.5)
WBC: 10.8 10*3/uL — ABNORMAL HIGH (ref 4.0–10.5)
nRBC: 0 % (ref 0.0–0.2)

## 2021-05-08 LAB — C-REACTIVE PROTEIN: CRP: 1.4 mg/dL — ABNORMAL HIGH (ref <1.0)

## 2021-05-08 LAB — GLUCOSE, CAPILLARY: Glucose-Capillary: 150 mg/dL — ABNORMAL HIGH (ref 70–99)

## 2021-05-08 LAB — FERRITIN: Ferritin: 99 ng/mL (ref 11–307)

## 2021-05-08 LAB — D-DIMER, QUANTITATIVE: D-Dimer, Quant: 1.01 ug/mL-FEU — ABNORMAL HIGH (ref 0.00–0.50)

## 2021-05-08 LAB — MAGNESIUM: Magnesium: 2.4 mg/dL (ref 1.7–2.4)

## 2021-05-08 MED ORDER — ALBUTEROL SULFATE (2.5 MG/3ML) 0.083% IN NEBU
INHALATION_SOLUTION | RESPIRATORY_TRACT | Status: AC
  Administered 2021-05-08: 2.5 mg via RESPIRATORY_TRACT
  Filled 2021-05-08: qty 3

## 2021-05-08 NOTE — Progress Notes (Signed)
Patient Demographics:    Susan Glass, is a 87 y.o. female, DOB - [birth year], MAU:[id number]  Admit date - 05/06/2021   Admitting Physician Charnelle Bergeman Denton Brick, MD  Outpatient Primary MD for the patient is Caprice Renshaw, MD  LOS - 2   Chief Complaint  Patient presents with   Altered Mental Status   Shortness of Breath        Subjective:    Susan Glass today has no fevers, no emesis,  No chest pain,  -sounds very congested -oral intake is not great -I called and updated daughter-in-law Susan Glass by phone   Assessment  & Plan :    Principal Problem:   Acute respiratory failure with hypoxia due to Covid PNA Active Problems:   COVID-19 virus infection   Pneumonia   Acute lower UTI   Essential hypertension   Hyperlipidemia  Brief Summary:- 87 y.o. female with past medical history relevant for chronic anemia of iron deficiency, HTN, HFpEF/chronic diastolic dysfunction CHF and history of CAD who admitted from [facility] on [facility]/63/8937 with acute hypoxic respiratory failure secondary to COVID-pneumonia as well as found to have UTI  A/p 1)Acute hypoxic respiratory failure secondary to COVID-19 infection/Pneumonia---  -Currently requiring 2 L of oxygen via nasal cannula The treatment plan and use of medications  for treatment of COVID-19 infection and possible side effects were discussed with patient/daughter in Law Ms Susan Glass -----Patient and daughter in [place] Ms Susan Glass verbalizes understanding and agrees to treatment protocols   --Patient is positive for COVID-19 infection, chest x-ray with findings of infiltrates/opacities,  patient is tachypneic/hypoxic and requiring continuous supplemental oxygen---patient meets criteria for initiation of Remdesivir AND Steroid therapy per protocol  --Check and trend inflammatory markers including D-dimer, ferritin and  CRP---also follow CBC and  CMP --Supplemental oxygen to keep O2 sats above 93% -Follow serial chest x-rays and ABGs as indicated --- Encourage prone positioning for More than 16 hours/day in increments of 2 to 3 hours at a time if able to tolerate --Attempt to maintain euvolemic state --Zinc and vitamin C as ordered -Albuterol inhaler as needed -Accu-Cheks/fingersticks while on high-dose steroids PPI for GI prophylaxis while on high-dose steroids COVID-19 Labs  Recent Labs    05/07/21 0550 05/08/21 0710  DDIMER 1.42* 1.01*  FERRITIN 101 99  CRP 3.3* 1.4*    Lab Results  Component Value Date   SARSCOV2NAA POSITIVE (A) 05/06/2021   [place] NEGATIVE 03/18/2021   2)Social/Ethics--- patient's only son who was 31 years old passed away from [place] infection on 05-18-21 -Patient wishes to be a DNR/DNI with full scope of treatment -This was verified with patient's daughter in [place] Ms Susan Glass   3)Sepsis secondary to presumed  UTI--UA suspicious for UTI,  POA--- on admission patient had tachycardia and tachypnea with hypoxia -Continue IV Rocephin pending urine culture -WBC 11>> 7.4  -lactic acid not elevated, PCT-0.19   4)HTN--continue Amlodipine 5 mg daily, labetalol IV as needed elevated BP   5)Grief/Anxiety/Adjustment Disorder due to death of patient's son--85 year old son died from [place] infection on 05/18/2010 -Lorazepam as needed   6)H/o CAD/H/o HFpEF/chronic diastolic dysfunction CHF--- appears compensated -Echo from 04/03/2021 with EF of 50 to 55% -BNP noted -Troponin negative -No ACS type symptoms  7) CKD 3A---- renal function appears close to baseline - renally adjust medications, avoid nephrotoxic agents / dehydration  / hypotension   8) chronic iron deficiency anemia----Hgb stable above 11,  --which is close to patient's baseline, no evidence of ongoing bleeding, continue iron supplementation   Disposition/Need for in-Hospital Stay- patient unable to be discharged at this time  due to --acute Hypoxic Resp Failure due to covid PNA and UTI-requiring IV remdesivir, IV steroids supplemental oxygen as well as IV Rocephin for UTI   Status is: Inpatient   Remains inpatient appropriate because: Please see disposition above   Dispo: The patient is from: SNF              Anticipated d/c is to: SNF  [name]              Anticipated d/c date is: > 3 days              Patient currently is not medically stable to d/c. Barriers: Not Clinically Stable-     Code Status : -  Code Status: DNR   Family Communication:   patient is alert, awake and coherent) Discussed with daughter in law-[name]  Consults  :  na  DVT Prophylaxis  :   - SCDs   SCDs Start: 05/06/21 1733 Place TED hose Start: 05/06/21 1733 enoxaparin (LOVENOX) injection 40 mg Start: 05/06/21 1230 SCDs Start: 05/06/21 1223    Lab Results  Component Value Date   PLT 176 05/08/2021    Inpatient Medications  Scheduled Meds:  amLODipine  5 mg Oral Daily   vitamin C  500 mg Oral Daily   aspirin  81 mg Oral Daily   calcium carbonate  500 mg of elemental calcium Oral Q breakfast   cholecalciferol  5,000 Units Oral Daily   divalproex  125 mg Oral BID   enoxaparin (LOVENOX) injection  40 mg Subcutaneous Q24H   ferrous sulfate  325 mg Oral Q breakfast   folic acid  1 mg Oral Daily   guaiFENesin  600 mg Oral BID   Ipratropium-Albuterol  1 puff Inhalation Q6H   isosorbide mononitrate  15 mg Oral Daily   melatonin  9 mg Oral QHS   methylPREDNISolone (SOLU-MEDROL) injection  0.5 mg/kg Intravenous Q12H   Followed by   Derrill Memo ON 05/09/2021] predniSONE  50 mg Oral Daily   multivitamin with minerals  1 tablet Oral Daily   pantoprazole  40 mg Oral Daily   polyethylene glycol  17 g Oral BID   rOPINIRole  2 mg Oral QHS   senna-docusate  2 tablet Oral BID   sodium chloride flush  3 mL Intravenous Q12H   sodium chloride flush  3 mL Intravenous Q12H   tamsulosin  0.4 mg Oral Daily   thiamine  100 mg Oral Daily    zinc sulfate  220 mg Oral Daily   Continuous Infusions:  sodium chloride     cefTRIAXone (ROCEPHIN)  IV 1 g (05/08/21 1048)   remdesivir 100 mg in NS 100 mL 100 mg (05/08/21 1316)   PRN Meds:.sodium chloride, acetaminophen **OR** acetaminophen, acetaminophen, bisacodyl, chlorpheniramine-HYDROcodone, guaiFENesin-dextromethorphan, labetalol, LORazepam, nitroGLYCERIN, ondansetron **OR** ondansetron (ZOFRAN) IV, ondansetron, phenol, sodium chloride flush, traZODone    Anti-infectives (From admission, onward)    Start     Dose/Rate Route Frequency Ordered Stop   05/07/21 1000  remdesivir 100 mg in sodium chloride 0.9 % 100 mL IVPB  Status:  Discontinued       See Hyperspace for  full Linked Orders Report.   100 mg 200 mL/hr over 30 Minutes Intravenous Daily 05/06/21 1225 05/06/21 1244   05/07/21 1000  remdesivir 100 mg in sodium chloride 0.9 % 100 mL IVPB        100 mg 200 mL/hr over 30 Minutes Intravenous Daily 05/06/21 1246 05/11/21 0959   05/07/21 0930  cefTRIAXone (ROCEPHIN) 1 g in sodium chloride 0.9 % 100 mL IVPB        1 g 200 mL/hr over 30 Minutes Intravenous Every 24 hours 05/06/21 1739     05/06/21 1300  remdesivir 100 mg in sodium chloride 0.9 % 100 mL IVPB        100 mg 200 mL/hr over 30 Minutes Intravenous Every 30 min 05/06/21 1243 05/06/21 1445   05/06/21 1230  remdesivir 200 mg in sodium chloride 0.9% 250 mL IVPB  Status:  Discontinued       See Hyperspace for full Linked Orders Report.   200 mg 580 mL/hr over 30 Minutes Intravenous Once 05/06/21 1225 05/06/21 1245   05/06/21 0930  cefTRIAXone (ROCEPHIN) 1 g in sodium chloride 0.9 % 100 mL IVPB  Status:  Discontinued        1 g 200 mL/hr over 30 Minutes Intravenous Every 24 hours 05/06/21 0916 05/06/21 1739   05/06/21 0930  azithromycin (ZITHROMAX) 500 mg in sodium chloride 0.9 % 250 mL IVPB  Status:  Discontinued        500 mg 250 mL/hr over 60 Minutes Intravenous Every 24 hours 05/06/21 0916 05/07/21 1203   05/06/21  0900  azithromycin (ZITHROMAX) 500 mg in sodium chloride 0.9 % 250 mL IVPB  Status:  Discontinued        500 mg 250 mL/hr over 60 Minutes Intravenous Every 24 hours 05/06/21 0852 05/06/21 0948   05/06/21 0800  cefTRIAXone (ROCEPHIN) 2 g in sodium chloride 0.9 % 100 mL IVPB        2 g 200 mL/hr over 30 Minutes Intravenous  Once 05/06/21 0747 05/06/21 0925         Objective:   Vitals:   05/07/21 2022 05/08/21 0609 05/08/21 1312 05/08/21 1344  BP:  130/67 (!) 115/57   Pulse:  77 76   Resp:  20 20   Temp:  (!) 97.5 F (36.4 C) 97.7 F (36.5 C)   TempSrc:  Oral Oral   SpO2: 95%  98% 100%  Weight:      Height:        Wt Readings from Last 3 Encounters:  05/06/21 70.3 kg  03/19/21 64.3 kg  05/11/18 57.2 kg     Intake/Output Summary (Last 24 hours) at 05/08/2021 1653 Last data filed at 05/08/2021 1100 Gross per 24 hour  Intake 400 ml  Output --  Net 400 ml     Physical Exam  Gen:- Awake Alert,  in no apparent distress  HEENT:- [place].AT, No sclera icterus Nose-  2L/min Neck-Supple Neck,No JVD,.  Lungs-sounds congested, rhonchorous, occasional wheezing  CV- S1, S2 normal, regular  Abd-  +ve B.Sounds, Abd Soft, No tenderness,    Extremity/Skin:- No  edema, pedal pulses present  Psych-affect is anxious, oriented x3 Neuro-generalized weakness, no new focal deficits, no tremors   Data Review:   Micro Results Recent Results (from the past 240 hour(s))  Blood culture (routine x 2)     Status: None (Preliminary result)   Collection Time: 05/06/21  7:33 AM   Specimen: Left Antecubital; Blood  Result Value Ref Range Status  Specimen Description LEFT ANTECUBITAL  Final   Special Requests   Final    BOTTLES DRAWN AEROBIC AND ANAEROBIC Blood Culture results may not be optimal due to an excessive volume of blood received in culture bottles   Culture   Final    NO GROWTH 2 DAYS Performed at [facility], [address] [place], [place] [address]    Report Status PENDING   Incomplete  Blood culture (routine x 2)     Status: None (Preliminary result)   Collection Time: 05/06/21  7:35 AM   Specimen: BLOOD LEFT WRIST  Result Value Ref Range Status   Specimen Description BLOOD LEFT WRIST  Final   Special Requests   Final    BOTTLES DRAWN AEROBIC AND ANAEROBIC Blood Culture results may not be optimal due to an excessive volume of blood received in culture bottles   Culture   Final    NO GROWTH 2 DAYS Performed at [facility], [address], [place], [place] [address]    Report Status PENDING  Incomplete  Resp Panel by RT-PCR (Flu A&B, Covid) Nasopharyngeal Swab     Status: Abnormal   Collection Time: 05/06/21  8:02 AM   Specimen: Nasopharyngeal Swab; Nasopharyngeal(NP) swabs in vial transport medium  Result Value Ref Range Status   SARS Coronavirus 2 by RT PCR POSITIVE (A) NEGATIVE Final    Comment: BOOTH,B AT 0926 ON 9.22.22 BY RUCINSKI,B (NOTE) SARS-CoV-2 target nucleic acids are DETECTED.  The SARS-CoV-2 RNA is generally detectable in upper respiratory specimens during the acute phase of infection. Positive results are indicative of the presence of the identified virus, but do not rule out bacterial infection or co-infection with other pathogens not detected by the test. Clinical correlation with patient history and other diagnostic information is necessary to determine patient infection status. The expected result is Negative.  Fact Sheet for Patients: EntrepreneurPulse.com.au  Fact Sheet for Healthcare Providers: IncredibleEmployment.be  This test is not yet approved or cleared by the [place] FDA and  has been authorized for detection and/or diagnosis of SARS-CoV-2 by FDA under an Emergency Use Authorization (EUA).  This EUA will remain in effect (meaning this test can be used) for the duration of  the COVID-19 de claration under Section 564(b)(1) of the Act, 21 U.S.C. section 360bbb-3(b)(1), unless the  authorization is terminated or revoked sooner.     Influenza A by PCR NEGATIVE NEGATIVE Final   Influenza B by PCR NEGATIVE NEGATIVE Final    Comment: (NOTE) The Xpert Xpress SARS-CoV-2/FLU/RSV plus assay is intended as an aid in the diagnosis of influenza from Nasopharyngeal swab specimens and should not be used as a sole basis for treatment. Nasal washings and aspirates are unacceptable for Xpert Xpress SARS-CoV-2/FLU/RSV testing.  Fact Sheet for Patients: EntrepreneurPulse.com.au  Fact Sheet for Healthcare Providers: IncredibleEmployment.be  This test is not yet approved or cleared by the [place] FDA and has been authorized for detection and/or diagnosis of SARS-CoV-2 by FDA under an Emergency Use Authorization (EUA). This EUA will remain in effect (meaning this test can be used) for the duration of the COVID-19 declaration under Section 564(b)(1) of the Act, 21 U.S.C. section 360bbb-3(b)(1), unless the authorization is terminated or revoked.  Performed at [facility], [address] [place],  [address]   Urine Culture     Status: Abnormal   Collection Time: 05/06/21  3:33 PM   Specimen: Urine, Clean Catch  Result Value Ref Range Status   Specimen Description  Final    URINE, CLEAN CATCH Performed at [facility], [address] [place], [place] [address]    Special Requests   Final    NONE Performed at [facility], [address], [place], [place] [address]    Culture MULTIPLE SPECIES PRESENT, SUGGEST RECOLLECTION (A)  Final   Report Status 05/08/2021 FINAL  Final  MRSA Next Gen by PCR, Nasal     Status: Abnormal   Collection Time: 05/06/21  6:26 PM   Specimen: Nasal Mucosa; Nasal Swab  Result Value Ref Range Status   MRSA by PCR Next Gen DETECTED (A) NOT DETECTED Final    Comment: RESULT CALLED TO, READ BACK BY AND VERIFIED WITH: JUSTIN MIZE RN,2051,05/06/2021, SELF S (NOTE) The GeneXpert MRSA Assay (FDA  approved for NASAL specimens only), is one component of a comprehensive MRSA colonization surveillance program. It is not intended to diagnose MRSA infection nor to guide or monitor treatment for MRSA infections. Test performance is not FDA approved in patients less than 45 years old. Performed at [facility], [address], [place],  [address]     Radiology Reports DG Chest 1 View  Result Date: 05/06/2021 CLINICAL DATA:  Shortness of breath, altered mental status EXAM: CHEST  1 VIEW COMPARISON:  Chest radiograph 03/20/2021 FINDINGS: The cardiomediastinal silhouette is stable. There is calcified atherosclerotic plaque of the aortic arch. There is new consolidative retrocardiac opacity as well as patchy opacity in the left midlung. There is a small left and suspected trace right pleural effusion. There is no focal consolidation on the right. There is no pneumothorax. There is degenerative change of both shoulders. There is no acute osseous abnormality. IMPRESSION: Consolidative retrocardiac opacity and patchy opacity in the left mid lung suspicious for pneumonia with a small left parapneumonic effusion. Recommend follow-up radiographs to resolution. Electronically Signed   By: Valetta Mole M.D.   On: 05/06/2021 08:48   CT HEAD WO CONTRAST (5MM)  Result Date: 05/06/2021 CLINICAL DATA:  Delirium, altered mental status EXAM: CT HEAD WITHOUT CONTRAST TECHNIQUE: Contiguous axial images were obtained from the base of the skull through the vertex without intravenous contrast. COMPARISON:  CT head 12/27/2017 FINDINGS: Brain: There is no evidence of acute intracranial hemorrhage, extra-axial fluid collection, or acute infarct. There is global parenchymal volume loss with commensurate enlargement of the ventricular system and CSF spaces. Confluent hypodensity in the subcortical and periventricular white matter likely reflects sequela of chronic white matter microangiopathy. There is no mass lesion.  There is no midline shift. Vascular: There is calcification of the bilateral cavernous ICAs. Skull: Normal. Negative for fracture or focal lesion. Sinuses/Orbits: There is complete opacification of the sphenoid sinuses with associated hyperostosis. There is layering fluid in the right sphenoid sinus. Bilateral lens implants are in place. The globes and orbits are otherwise unremarkable. Other: None IMPRESSION: 1. No acute intracranial pathology. 2. Global parenchymal volume loss and chronic white matter microangiopathy. 3. Chronic bilateral sphenoid sinusitis with layering fluid in the right sphenoid sinus which could reflect superimposed acute sinusitis in the correct clinical setting. Electronically Signed   By: Valetta Mole M.D.   On: 05/06/2021 08:40     CBC Recent Labs  Lab 05/06/21 0745 05/07/21 0550 05/08/21 0710  WBC 11.0* 7.4 10.8*  HGB 11.7* 11.4* 11.5*  HCT 36.5 36.5 36.5  PLT 185 177 176  MCV 95.8 94.3 94.3  MCH 30.7 29.5 29.7  MCHC 32.1 31.2 31.5  RDW 14.7 14.4 14.7  LYMPHSABS 2.0 0.4* 0.5*  MONOABS 0.8 0.2 0.6  EOSABS 0.6* 0.0 0.0  BASOSABS 0.0 0.0 0.0    Chemistries  Recent Labs  Lab 05/06/21 0745 05/07/21 0550 05/08/21 0710  NA 137 139 141  K 4.2 4.2 4.6  CL 105 106 109  CO2 26 22 24   GLUCOSE 95 176* 129*  BUN 18 18 22   CREATININE 0.86 0.74 0.71  CALCIUM 8.5* 8.7* 9.1  MG  --  2.3 2.4  AST 15 64* 53*  ALT 13 48* 49*  ALKPHOS 66 78 67  BILITOT 0.6 0.3 0.4   ------------------------------------------------------------------------------------------------------------------ No results for input(s): CHOL, HDL, LDLCALC, TRIG, CHOLHDL, LDLDIRECT in the last 72 hours.  No results found for: HGBA1C ------------------------------------------------------------------------------------------------------------------ No results for input(s): TSH, T4TOTAL, T3FREE, THYROIDAB in the last 72 hours.  Invalid input(s):  FREET3 ------------------------------------------------------------------------------------------------------------------ Recent Labs    05/07/21 0550 05/08/21 0710  FERRITIN 101 99    Coagulation profile Recent Labs  Lab 05/06/21 0745  INR 1.2    Recent Labs    05/07/21 0550 05/08/21 0710  DDIMER 1.42* 1.01*    Cardiac Enzymes No results for input(s): CKMB, TROPONINI, MYOGLOBIN in the last 168 hours.  Invalid input(s): CK ------------------------------------------------------------------------------------------------------------------    Component Value Date/Time   BNP 529.0 (H) 05/06/2021 0211   Roxan Hockey M.D on 05/08/2021 at 4:53 PM  Go to [email or web address] - for contact info  [facility] - Office  [phone]

## 2021-05-09 ENCOUNTER — Encounter (HOSPITAL_COMMUNITY): Payer: Self-pay | Admitting: Family Medicine

## 2021-05-09 DIAGNOSIS — J9601 Acute respiratory failure with hypoxia: Secondary | ICD-10-CM | POA: Diagnosis not present

## 2021-05-09 LAB — COMPREHENSIVE METABOLIC PANEL
ALT: 44 U/L (ref 0–44)
AST: 35 U/L (ref 15–41)
Albumin: 3.3 g/dL — ABNORMAL LOW (ref 3.5–5.0)
Alkaline Phosphatase: 61 U/L (ref 38–126)
Anion gap: 6 (ref 5–15)
BUN: 23 mg/dL (ref 8–23)
CO2: 26 mmol/L (ref 22–32)
Calcium: 8.8 mg/dL — ABNORMAL LOW (ref 8.9–10.3)
Chloride: 110 mmol/L (ref 98–111)
Creatinine, Ser: 0.74 mg/dL (ref 0.44–1.00)
GFR, Estimated: >60 mL/min (ref >60)
Glucose, Bld: 134 mg/dL — ABNORMAL HIGH (ref 70–99)
Potassium: 4.7 mmol/L (ref 3.5–5.1)
Sodium: 142 mmol/L (ref 135–145)
Total Bilirubin: 0.5 mg/dL (ref 0.3–1.2)
Total Protein: 5.9 g/dL — ABNORMAL LOW (ref 6.5–8.1)

## 2021-05-09 LAB — FERRITIN: Ferritin: 95 ng/mL (ref 11–307)

## 2021-05-09 LAB — PHOSPHORUS: Phosphorus: 3.4 mg/dL (ref 2.5–4.6)

## 2021-05-09 LAB — CBC WITH DIFFERENTIAL/PLATELET
Abs Immature Granulocytes: 0.06 10*3/uL (ref 0.00–0.07)
Basophils Absolute: 0 10*3/uL (ref 0.0–0.1)
Basophils Relative: 0 %
Eosinophils Absolute: 0 10*3/uL (ref 0.0–0.5)
Eosinophils Relative: 0 %
HCT: 38.4 % (ref 36.0–46.0)
Hemoglobin: 12.1 g/dL (ref 12.0–15.0)
Immature Granulocytes: 1 %
Lymphocytes Relative: 3 %
Lymphs Abs: 0.3 10*3/uL — ABNORMAL LOW (ref 0.7–4.0)
MCH: 29.7 pg (ref 26.0–34.0)
MCHC: 31.5 g/dL (ref 30.0–36.0)
MCV: 94.1 fL (ref 80.0–100.0)
Monocytes Absolute: 0.4 10*3/uL (ref 0.1–1.0)
Monocytes Relative: 4 %
Neutro Abs: 8.9 10*3/uL — ABNORMAL HIGH (ref 1.7–7.7)
Neutrophils Relative %: 92 %
Platelets: 192 10*3/uL (ref 150–400)
RBC: 4.08 MIL/uL (ref 3.87–5.11)
RDW: 14.9 % (ref 11.5–15.5)
WBC: 9.6 10*3/uL (ref 4.0–10.5)
nRBC: 0 % (ref 0.0–0.2)

## 2021-05-09 LAB — C-REACTIVE PROTEIN: CRP: 1 mg/dL — ABNORMAL HIGH (ref <1.0)

## 2021-05-09 LAB — D-DIMER, QUANTITATIVE: D-Dimer, Quant: 0.79 ug/mL-FEU — ABNORMAL HIGH (ref 0.00–0.50)

## 2021-05-09 LAB — GLUCOSE, CAPILLARY: Glucose-Capillary: 123 mg/dL — ABNORMAL HIGH (ref 70–99)

## 2021-05-09 LAB — MAGNESIUM: Magnesium: 2.6 mg/dL — ABNORMAL HIGH (ref 1.7–2.4)

## 2021-05-09 MED ORDER — IPRATROPIUM BROMIDE 0.02 % IN SOLN
RESPIRATORY_TRACT | Status: AC
  Administered 2021-05-09: 0.5 mg
  Filled 2021-05-09: qty 2.5

## 2021-05-09 MED ORDER — ALBUTEROL SULFATE (2.5 MG/3ML) 0.083% IN NEBU
INHALATION_SOLUTION | RESPIRATORY_TRACT | Status: AC
  Administered 2021-05-09: 2.5 mg
  Filled 2021-05-09: qty 3

## 2021-05-09 NOTE — Progress Notes (Signed)
Alert and sitting up in bed feeding self.  No choking noted on food but choking on thin liquids with straw.  Spoke with Dr. Maurene Capes and he ordered swallow eval.

## 2021-05-09 NOTE — Progress Notes (Addendum)
Patient Demographics:    Susan Glass, is a 87 y.o. female, DOB - June 01, 1917, [id number]  Admit date - 05/06/2021   Admitting Physician Kendarrius Tanzi Denton Brick, MD  Outpatient Primary MD for the patient is Caprice Renshaw, MD  LOS - 3   Chief Complaint  Patient presents with   Altered Mental Status   Shortness of Breath        Subjective:    Susan Glass today has no fevers, no emesis,  No chest pain,  ??aspiration concerns Sounds rattling at times   Assessment  & Plan :    Principal Problem:   Acute respiratory failure with hypoxia due to Covid PNA Active Problems:   COVID-19 virus infection   Pneumonia   Acute lower UTI   Essential hypertension   Hyperlipidemia  Brief Summary:- 87 y.o. female with past medical history relevant for chronic anemia of iron deficiency, HTN, HFpEF/chronic diastolic dysfunction CHF and history of CAD who admitted from [facility] on 1/63/8453 with acute hypoxic respiratory failure secondary to COVID-pneumonia as well as found to have UTI  A/p 1)Acute hypoxic respiratory failure secondary to COVID-19 infection/Pneumonia---  --Hypoxia is improving--- -Currently requiring 1 L of oxygen via nasal cannula The treatment plan and use of medications  for treatment of COVID-19 infection and possible side effects were discussed with patient/daughter in Law [name] -----Patient and daughter in [place] [name] verbalizes understanding and agrees to treatment protocols   --Patient is positive for COVID-19 infection, chest x-ray with findings of infiltrates/opacities,  patient is tachypneic/hypoxic and requiring continuous supplemental oxygen---patient meets criteria for initiation of Remdesivir AND Steroid therapy per protocol  --Check and trend inflammatory markers including D-dimer, ferritin and  CRP---also follow CBC and CMP --Supplemental oxygen to keep O2 sats  above 93% -Follow serial chest x-rays and ABGs as indicated --- Encourage prone positioning for More than 16 hours/day in increments of 2 to 3 hours at a time if able to tolerate --Attempt to maintain euvolemic state --Zinc and vitamin C as ordered -Albuterol inhaler as needed -Accu-Cheks/fingersticks while on high-dose steroids PPI for GI prophylaxis while on high-dose steroids COVID-19 Labs  Recent Labs    05/07/21 0550 05/08/21 0710 05/09/21 0620  DDIMER 1.42* 1.01* 0.79*  FERRITIN 101 99 95  CRP 3.3* 1.4* 1.0*    Lab Results  Component Value Date   SARSCOV2NAA POSITIVE (A) 05/06/2021   [place] NEGATIVE 03/18/2021   2)Social/Ethics--- patient's only son who was 8 years old passed away from [place] infection on 05/24/2021 -Patient wishes to be a DNR/DNI with full scope of treatment -This was verified with patient's daughter in [place] [name]   3)Suspicion of Sepsis secondary to presumed  UTI--UA suspicious for UTI,  --- on admission patient had tachycardia and tachypnea with hypoxia -Urine culture without growth -Sepsis ruled out SIRs pathophysiology was most likely due to COVID-19 respiratory failure with hypoxia -Stop IV Rocephin as urine culture is w/o growth -WBC 11>> 7.4 >> 9.6 -lactic acid not elevated, PCT-0.19   4)HTN--continue Amlodipine 5 mg daily, labetalol IV as needed elevated BP   5)Grief/Anxiety/Adjustment Disorder due to death of patient's son--patient 55 year old son died from [place] infection on May 24, 2010 -Lorazepam as needed   6)H/o CAD/H/o HFpEF/chronic diastolic  dysfunction CHF--- appears compensated -Echo from 04/03/2021 with EF of 50 to 55% -BNP noted -Troponin negative -No ACS type symptoms   7) CKD 3A---- renal function appears close to baseline - renally adjust medications, avoid nephrotoxic agents / dehydration  / hypotension   8) chronic iron deficiency anemia----Hgb stable above 11,  --which is close to patient's baseline, no  evidence of ongoing bleeding, continue iron supplementation  9)Dysphagia---?? Aspiration risk, diet changed to pured, speech/swallow eval requested   Disposition/Need for in-Hospital Stay- patient unable to be discharged at this time due to --acute Hypoxic Resp Failure due to covid PNA -requiring IV remdesivir, IV steroids supplemental oxygen    Status is: Inpatient   Remains inpatient appropriate because: Please see disposition above   Dispo: The patient is from: SNF              Anticipated d/c is to: SNF  [name]              Anticipated d/c date is: > 3 days              Patient currently is not medically stable to d/c. Barriers: Not Clinically Stable-     Code Status : -  Code Status: DNR   Family Communication:   patient is alert, awake and coherent) Discussed with daughter in law-[name]  Consults  :  na  DVT Prophylaxis  :   - SCDs   SCDs Start: 05/06/21 1733 Place TED hose Start: 05/06/21 1733 enoxaparin (LOVENOX) injection 40 mg Start: 05/06/21 1230 SCDs Start: 05/06/21 1223    Lab Results  Component Value Date   PLT 192 05/09/2021    Inpatient Medications  Scheduled Meds:  amLODipine  5 mg Oral Daily   vitamin C  500 mg Oral Daily   aspirin  81 mg Oral Daily   calcium carbonate  500 mg of elemental calcium Oral Q breakfast   cholecalciferol  5,000 Units Oral Daily   divalproex  125 mg Oral BID   enoxaparin (LOVENOX) injection  40 mg Subcutaneous Q24H   ferrous sulfate  325 mg Oral Q breakfast   folic acid  1 mg Oral Daily   guaiFENesin  600 mg Oral BID   Ipratropium-Albuterol  1 puff Inhalation Q6H   isosorbide mononitrate  15 mg Oral Daily   melatonin  9 mg Oral QHS   multivitamin with minerals  1 tablet Oral Daily   pantoprazole  40 mg Oral Daily   polyethylene glycol  17 g Oral BID   predniSONE  50 mg Oral Daily   rOPINIRole  2 mg Oral QHS   senna-docusate  2 tablet Oral BID   sodium chloride flush  3 mL Intravenous Q12H   sodium chloride flush   3 mL Intravenous Q12H   tamsulosin  0.4 mg Oral Daily   thiamine  100 mg Oral Daily   zinc sulfate  220 mg Oral Daily   Continuous Infusions:  sodium chloride     cefTRIAXone (ROCEPHIN)  IV 1 g (05/09/21 0849)   remdesivir 100 mg in NS 100 mL 100 mg (05/09/21 0923)   PRN Meds:.sodium chloride, acetaminophen **OR** acetaminophen, acetaminophen, bisacodyl, chlorpheniramine-HYDROcodone, guaiFENesin-dextromethorphan, labetalol, LORazepam, nitroGLYCERIN, ondansetron **OR** ondansetron (ZOFRAN) IV, ondansetron, phenol, sodium chloride flush, traZODone    Anti-infectives (From admission, onward)    Start     Dose/Rate Route Frequency Ordered Stop   05/07/21 1000  remdesivir 100 mg in sodium chloride 0.9 % 100 mL IVPB  Status:  Discontinued       See Hyperspace for full Linked Orders Report.   100 mg 200 mL/hr over 30 Minutes Intravenous Daily 05/06/21 1225 05/06/21 1244   05/07/21 1000  remdesivir 100 mg in sodium chloride 0.9 % 100 mL IVPB        100 mg 200 mL/hr over 30 Minutes Intravenous Daily 05/06/21 1246 05/11/21 0959   05/07/21 0930  cefTRIAXone (ROCEPHIN) 1 g in sodium chloride 0.9 % 100 mL IVPB        1 g 200 mL/hr over 30 Minutes Intravenous Every 24 hours 05/06/21 1739     05/06/21 1300  remdesivir 100 mg in sodium chloride 0.9 % 100 mL IVPB        100 mg 200 mL/hr over 30 Minutes Intravenous Every 30 min 05/06/21 1243 05/06/21 1445   05/06/21 1230  remdesivir 200 mg in sodium chloride 0.9% 250 mL IVPB  Status:  Discontinued       See Hyperspace for full Linked Orders Report.   200 mg 580 mL/hr over 30 Minutes Intravenous Once 05/06/21 1225 05/06/21 1245   05/06/21 0930  cefTRIAXone (ROCEPHIN) 1 g in sodium chloride 0.9 % 100 mL IVPB  Status:  Discontinued        1 g 200 mL/hr over 30 Minutes Intravenous Every 24 hours 05/06/21 0916 05/06/21 1739   05/06/21 0930  azithromycin (ZITHROMAX) 500 mg in sodium chloride 0.9 % 250 mL IVPB  Status:  Discontinued        500 mg 250  mL/hr over 60 Minutes Intravenous Every 24 hours 05/06/21 0916 05/07/21 1203   05/06/21 0900  azithromycin (ZITHROMAX) 500 mg in sodium chloride 0.9 % 250 mL IVPB  Status:  Discontinued        500 mg 250 mL/hr over 60 Minutes Intravenous Every 24 hours 05/06/21 0852 05/06/21 0948   05/06/21 0800  cefTRIAXone (ROCEPHIN) 2 g in sodium chloride 0.9 % 100 mL IVPB        2 g 200 mL/hr over 30 Minutes Intravenous  Once 05/06/21 0747 05/06/21 0925         Objective:   Vitals:   05/08/21 1959 05/08/21 2156 05/08/21 2230 05/09/21 0800  BP:  (!) 121/100 128/74   Pulse:  75    Resp:  20    Temp:  98.7 F (37.1 C)    TempSrc:  Oral    SpO2: 98% 100%  98%  Weight:      Height:        Wt Readings from Last 3 Encounters:  05/06/21 70.3 kg  03/19/21 64.3 kg  05/11/18 57.2 kg     Intake/Output Summary (Last 24 hours) at 05/09/2021 0936 Last data filed at 05/09/2021 0900 Gross per 24 hour  Intake 525 ml  Output --  Net 525 ml     Physical Exam  Gen:- Awake Alert,  in no apparent distress  HEENT:- [place].AT, No sclera icterus Nose- [place] 1L/min Neck-Supple Neck,No JVD,.  Lungs-sounds rhonchorous, no significant wheezing or rales  CV- S1, S2 normal, regular  Abd-  +ve B.Sounds, Abd Soft, No tenderness,    Extremity/Skin:- No  edema, pedal pulses present  Psych-affect is anxious, oriented x3 Neuro-generalized weakness, no new focal deficits, no tremors   Data Review:   Micro Results Recent Results (from the past 240 hour(s))  Blood culture (routine x 2)     Status: None (Preliminary result)   Collection Time: 05/06/21  7:33 AM   Specimen: Left  Antecubital; Blood  Result Value Ref Range Status   Specimen Description LEFT ANTECUBITAL  Final   Special Requests   Final    BOTTLES DRAWN AEROBIC AND ANAEROBIC Blood Culture results may not be optimal due to an excessive volume of blood received in culture bottles   Culture   Final    NO GROWTH 3 DAYS Performed at [facility],  [address] [place], [place] [address]    Report Status PENDING  Incomplete  Blood culture (routine x 2)     Status: None (Preliminary result)   Collection Time: 05/06/21  7:35 AM   Specimen: BLOOD LEFT WRIST  Result Value Ref Range Status   Specimen Description BLOOD LEFT WRIST  Final   Special Requests   Final    BOTTLES DRAWN AEROBIC AND ANAEROBIC Blood Culture results may not be optimal due to an excessive volume of blood received in culture bottles   Culture   Final    NO GROWTH 3 DAYS Performed at [facility], [address] [place], [place] [address]    Report Status PENDING  Incomplete  Resp Panel by RT-PCR (Flu A&B, Covid) Nasopharyngeal Swab     Status: Abnormal   Collection Time: 05/06/21  8:02 AM   Specimen: Nasopharyngeal Swab; Nasopharyngeal(NP) swabs in vial transport medium  Result Value Ref Range Status   SARS Coronavirus 2 by RT PCR POSITIVE (A) NEGATIVE Final    Comment: BOOTH,B AT 0926 ON 9.22.22 BY RUCINSKI,B (NOTE) SARS-CoV-2 target nucleic acids are DETECTED.  The SARS-CoV-2 RNA is generally detectable in upper respiratory specimens during the acute phase of infection. Positive results are indicative of the presence of the identified virus, but do not rule out bacterial infection or co-infection with other pathogens not detected by the test. Clinical correlation with patient history and other diagnostic information is necessary to determine patient infection status. The expected result is Negative.  Fact Sheet for Patients: EntrepreneurPulse.com.au  Fact Sheet for Healthcare Providers: IncredibleEmployment.be  This test is not yet approved or cleared by the [place] FDA and  has been authorized for detection and/or diagnosis of SARS-CoV-2 by FDA under an Emergency Use Authorization (EUA).  This EUA will remain in effect (meaning this test can be used) for the duration of  the COVID-19 de claration under Section  564(b)(1) of the Act, 21 U.S.C. section 360bbb-3(b)(1), unless the authorization is terminated or revoked sooner.     Influenza A by PCR NEGATIVE NEGATIVE Final   Influenza B by PCR NEGATIVE NEGATIVE Final    Comment: (NOTE) The Xpert Xpress SARS-CoV-2/FLU/RSV plus assay is intended as an aid in the diagnosis of influenza from Nasopharyngeal swab specimens and should not be used as a sole basis for treatment. Nasal washings and aspirates are unacceptable for Xpert Xpress SARS-CoV-2/FLU/RSV testing.  Fact Sheet for Patients: EntrepreneurPulse.com.au  Fact Sheet for Healthcare Providers: IncredibleEmployment.be  This test is not yet approved or cleared by the [place] FDA and has been authorized for detection and/or diagnosis of SARS-CoV-2 by FDA under an Emergency Use Authorization (EUA). This EUA will remain in effect (meaning this test can be used) for the duration of the COVID-19 declaration under Section 564(b)(1) of the Act, 21 U.S.C. section 360bbb-3(b)(1), unless the authorization is terminated or revoked.  Performed at [facility], [address] [place],  [address]   Urine Culture     Status: Abnormal   Collection Time: 05/06/21  3:33 PM   Specimen: Urine, Clean Catch  Result  Value Ref Range Status   Specimen Description   Final    URINE, CLEAN CATCH Performed at [facility], [address], [place], [place] [address]    Special Requests   Final    NONE Performed at [facility], [address], [place], [place] [address]    Culture MULTIPLE SPECIES PRESENT, SUGGEST RECOLLECTION (A)  Final   Report Status 05/08/2021 FINAL  Final  MRSA Next Gen by PCR, Nasal     Status: Abnormal   Collection Time: 05/06/21  6:26 PM   Specimen: Nasal Mucosa; Nasal Swab  Result Value Ref Range Status   MRSA by PCR Next Gen DETECTED (A) NOT DETECTED Final    Comment: RESULT CALLED TO, READ BACK BY AND VERIFIED WITH: JUSTIN MIZE  RN,2051,05/06/2021, SELF S (NOTE) The GeneXpert MRSA Assay (FDA approved for NASAL specimens only), is one component of a comprehensive MRSA colonization surveillance program. It is not intended to diagnose MRSA infection nor to guide or monitor treatment for MRSA infections. Test performance is not FDA approved in patients less than 72 years old. Performed at [facility], [address], [place], [place] [address]     Radiology Reports DG Chest 1 View  Result Date: 05/06/2021 CLINICAL DATA:  Shortness of breath, altered mental status EXAM: CHEST  1 VIEW COMPARISON:  Chest radiograph 03/20/2021 FINDINGS: The cardiomediastinal silhouette is stable. There is calcified atherosclerotic plaque of the aortic arch. There is new consolidative retrocardiac opacity as well as patchy opacity in the left midlung. There is a small left and suspected trace right pleural effusion. There is no focal consolidation on the right. There is no pneumothorax. There is degenerative change of both shoulders. There is no acute osseous abnormality. IMPRESSION: Consolidative retrocardiac opacity and patchy opacity in the left mid lung suspicious for pneumonia with a small left parapneumonic effusion. Recommend follow-up radiographs to resolution. Electronically Signed   By: Valetta Mole M.D.   On: 05/06/2021 08:48   CT HEAD WO CONTRAST (5MM)  Result Date: 05/06/2021 CLINICAL DATA:  Delirium, altered mental status EXAM: CT HEAD WITHOUT CONTRAST TECHNIQUE: Contiguous axial images were obtained from the base of the skull through the vertex without intravenous contrast. COMPARISON:  CT head 12/27/2017 FINDINGS: Brain: There is no evidence of acute intracranial hemorrhage, extra-axial fluid collection, or acute infarct. There is global parenchymal volume loss with commensurate enlargement of the ventricular system and CSF spaces. Confluent hypodensity in the subcortical and periventricular white matter likely reflects sequela of  chronic white matter microangiopathy. There is no mass lesion. There is no midline shift. Vascular: There is calcification of the bilateral cavernous ICAs. Skull: Normal. Negative for fracture or focal lesion. Sinuses/Orbits: There is complete opacification of the sphenoid sinuses with associated hyperostosis. There is layering fluid in the right sphenoid sinus. Bilateral lens implants are in place. The globes and orbits are otherwise unremarkable. Other: None IMPRESSION: 1. No acute intracranial pathology. 2. Global parenchymal volume loss and chronic white matter microangiopathy. 3. Chronic bilateral sphenoid sinusitis with layering fluid in the right sphenoid sinus which could reflect superimposed acute sinusitis in the correct clinical setting. Electronically Signed   By: Valetta Mole M.D.   On: 05/06/2021 08:40     CBC Recent Labs  Lab 05/06/21 0745 05/07/21 0550 05/08/21 0710 05/09/21 0620  WBC 11.0* 7.4 10.8* 9.6  HGB 11.7* 11.4* 11.5* 12.1  HCT 36.5 36.5 36.5 38.4  PLT 185 177 176 192  MCV 95.8 94.3 94.3 94.1  MCH 30.7  29.5 29.7 29.7  MCHC 32.1 31.2 31.5 31.5  RDW 14.7 14.4 14.7 14.9  LYMPHSABS 2.0 0.4* 0.5* 0.3*  MONOABS 0.8 0.2 0.6 0.4  EOSABS 0.6* 0.0 0.0 0.0  BASOSABS 0.0 0.0 0.0 0.0    Chemistries  Recent Labs  Lab 05/06/21 0745 05/07/21 0550 05/08/21 0710 05/09/21 0620  NA 137 139 141 142  K 4.2 4.2 4.6 4.7  CL 105 106 109 110  CO2 26 22 24 26   GLUCOSE 95 176* 129* 134*  BUN 18 18 22 23   CREATININE 0.86 0.74 0.71 0.74  CALCIUM 8.5* 8.7* 9.1 8.8*  MG  --  2.3 2.4 2.6*  AST 15 64* 53* 35  ALT 13 48* 49* 44  ALKPHOS 66 78 67 61  BILITOT 0.6 0.3 0.4 0.5   ------------------------------------------------------------------------------------------------------------------ No results for input(s): CHOL, HDL, LDLCALC, TRIG, CHOLHDL, LDLDIRECT in the last 72 hours.  No results found for:  HGBA1C ------------------------------------------------------------------------------------------------------------------ No results for input(s): TSH, T4TOTAL, T3FREE, THYROIDAB in the last 72 hours.  Invalid input(s): FREET3 ------------------------------------------------------------------------------------------------------------------ Recent Labs    05/08/21 0710 05/09/21 0620  FERRITIN 99 95    Coagulation profile Recent Labs  Lab 05/06/21 0745  INR 1.2    Recent Labs    05/08/21 0710 05/09/21 0620  DDIMER 1.01* 0.79*    Cardiac Enzymes No results for input(s): CKMB, TROPONINI, MYOGLOBIN in the last 168 hours.  Invalid input(s): CK ------------------------------------------------------------------------------------------------------------------    Component Value Date/Time   BNP 529.0 (H) 05/06/2021 1423   Roxan Hockey M.D on 05/09/2021 at 9:36 AM  Go to [email or web address] - for contact info  [facility] - Office  [phone]

## 2021-05-10 DIAGNOSIS — J9601 Acute respiratory failure with hypoxia: Secondary | ICD-10-CM | POA: Diagnosis not present

## 2021-05-10 LAB — CBC WITH DIFFERENTIAL/PLATELET
Abs Immature Granulocytes: 0.15 10*3/uL — ABNORMAL HIGH (ref 0.00–0.07)
Basophils Absolute: 0 10*3/uL (ref 0.0–0.1)
Basophils Relative: 0 %
Eosinophils Absolute: 0 10*3/uL (ref 0.0–0.5)
Eosinophils Relative: 0 %
HCT: 37.4 % (ref 36.0–46.0)
Hemoglobin: 11.6 g/dL — ABNORMAL LOW (ref 12.0–15.0)
Immature Granulocytes: 1 %
Lymphocytes Relative: 8 %
Lymphs Abs: 1.1 10*3/uL (ref 0.7–4.0)
MCH: 30.1 pg (ref 26.0–34.0)
MCHC: 31 g/dL (ref 30.0–36.0)
MCV: 96.9 fL (ref 80.0–100.0)
Monocytes Absolute: 1.3 10*3/uL — ABNORMAL HIGH (ref 0.1–1.0)
Monocytes Relative: 10 %
Neutro Abs: 10.1 10*3/uL — ABNORMAL HIGH (ref 1.7–7.7)
Neutrophils Relative %: 81 %
Platelets: 165 10*3/uL (ref 150–400)
RBC: 3.86 MIL/uL — ABNORMAL LOW (ref 3.87–5.11)
RDW: 15.2 % (ref 11.5–15.5)
WBC: 12.6 10*3/uL — ABNORMAL HIGH (ref 4.0–10.5)
nRBC: 0 % (ref 0.0–0.2)

## 2021-05-10 LAB — FERRITIN: Ferritin: 81 ng/mL (ref 11–307)

## 2021-05-10 LAB — COMPREHENSIVE METABOLIC PANEL
ALT: 39 U/L (ref 0–44)
AST: 26 U/L (ref 15–41)
Albumin: 3.3 g/dL — ABNORMAL LOW (ref 3.5–5.0)
Alkaline Phosphatase: 59 U/L (ref 38–126)
Anion gap: 7 (ref 5–15)
BUN: 25 mg/dL — ABNORMAL HIGH (ref 8–23)
CO2: 25 mmol/L (ref 22–32)
Calcium: 8.8 mg/dL — ABNORMAL LOW (ref 8.9–10.3)
Chloride: 111 mmol/L (ref 98–111)
Creatinine, Ser: 0.69 mg/dL (ref 0.44–1.00)
GFR, Estimated: >60 mL/min (ref >60)
Glucose, Bld: 98 mg/dL (ref 70–99)
Potassium: 4.2 mmol/L (ref 3.5–5.1)
Sodium: 143 mmol/L (ref 135–145)
Total Bilirubin: 0.3 mg/dL (ref 0.3–1.2)
Total Protein: 5.9 g/dL — ABNORMAL LOW (ref 6.5–8.1)

## 2021-05-10 LAB — C-REACTIVE PROTEIN: CRP: 0.7 mg/dL (ref <1.0)

## 2021-05-10 LAB — MAGNESIUM: Magnesium: 2.6 mg/dL — ABNORMAL HIGH (ref 1.7–2.4)

## 2021-05-10 LAB — PHOSPHORUS: Phosphorus: 3 mg/dL (ref 2.5–4.6)

## 2021-05-10 LAB — D-DIMER, QUANTITATIVE: D-Dimer, Quant: 0.75 ug/mL-FEU — ABNORMAL HIGH (ref 0.00–0.50)

## 2021-05-10 NOTE — Care Management Important Message (Signed)
Important Message  Patient Details  Name: Susan Glass MRN: [id number] Date of Birth: 1917-01-05   Medicare Important Message Given:  Yes - Important Message mailed due to current National Emergency     Tommy Medal 05/10/2021, 3:24 PM

## 2021-05-10 NOTE — Evaluation (Signed)
Clinical/Bedside Swallow Evaluation Patient Details  Name: Susan Glass MRN: [id number] Date of Birth: 09-07-1916  Today's Date: 05/10/2021 Time: SLP Start Time (ACUTE ONLY): 1430 SLP Stop Time (ACUTE ONLY): 1455 SLP Time Calculation (min) (ACUTE ONLY): 25 min  Past Medical History:  Past Medical History:  Diagnosis Date   CAD (coronary artery disease)    CHF (congestive heart failure) (HCC)    GERD (gastroesophageal reflux disease)    GI bleed    UGI   Hiatal hernia    large, sliding    Hyperlipidemia    hypercholesterolemia   Hypertension    IDA (iron deficiency anemia)    Past Surgical History:  Past Surgical History:  Procedure Laterality Date   APPENDECTOMY     CHOLECYSTECTOMY     COLONOSCOPY  2008   Dr. Gala Romney: Hyperplastic polyp removed, oozing erosion at the ileocecal valve with benign pathology, likely NSAID related.   ERCP N/A 03/22/2021   Procedure: ENDOSCOPIC RETROGRADE CHOLANGIOPANCREATOGRAPHY (ERCP);  Surgeon: Rogene Houston, MD;  Location: [facility];  Service: Gastroenterology;  Laterality: N/A;   ESOPHAGEAL DILATION N/A 11/28/2014   Procedure: ESOPHAGEAL DILATION;  Surgeon: Danie Binder, MD;  Location: [facility];  Service: Endoscopy;  Laterality: N/A;   ESOPHAGOGASTRODUODENOSCOPY N/A 11/28/2014   Dr.Fields- UGI bleed d/t severe distal erosive esophagitis in the setting of GE JXN stricture, large sliding hiatal hernia, mild non-erosive gastritis   SPHINCTEROTOMY N/A 03/22/2021   Procedure: SPHINCTEROTOMY;  Surgeon: Rogene Houston, MD;  Location: [facility];  Service: Gastroenterology;  Laterality: N/A;   HPI:  87 y.o. female with past medical history relevant for chronic anemia of iron deficiency, HTN, HFpEF/chronic diastolic dysfunction CHF and history of CAD who admitted from [facility] on 1/69/6789 with acute hypoxic respiratory failure secondary to COVID-pneumonia as well as found to have UTI. RN noted coughing with liquids on Sunday, so a BSE was ordered.     Assessment / Plan / Recommendation  Clinical Impression  Pt with audible wheeze/cough upon SLP arrival and prior to po administration. RT reports increased coughing after meals for breathing treatments. Clinical swallow evaluation completed at bedside. Pt HOH, but able to follow commands with visual cues. She was assessed with ice chips, thin water via cup/straw, NTL, puree, and regular textures. Pt able to self present, once placed in her hands. She exhibited one episode of strong coughing following sequential straw sips of thin water. She was cued to take small sips, however this was poorly executed. Suspect Pt with occasional aspiration events with thins at this time likely due to respiratory compromise. Will place her on on a conservative diet with plans to advance back to thins of D2 and NTL, but ok for sips of thin water only between meals and after oral care, all other liquids should be nectar-thick when also consuming meals. PO medications can be presented whole in puree or with NTL. SLP will continue to follow. SLP Visit Diagnosis: Dysphagia, unspecified (R13.10)    Aspiration Risk  Mild aspiration risk    Diet Recommendation Dysphagia 2 (Fine chop);Nectar-thick liquid   Liquid Administration via: Cup Medication Administration: Whole meds with puree Supervision: Patient able to self feed;Full supervision/cueing for compensatory strategies Compensations: Slow rate;Small sips/bites Postural Changes: Seated upright at 90 degrees;Remain upright for at least 30 minutes after po intake    Other  Recommendations Oral Care Recommendations: Oral care BID;Staff/trained caregiver to provide oral care Other Recommendations: Prohibited food (jello, ice cream, thin soups);Clarify dietary restrictions  Recommendations for follow up therapy are one component of a multi-disciplinary discharge planning process, led by the attending physician.  Recommendations may be updated based on patient status,  additional functional criteria and insurance authorization.  Follow up Recommendations 24 hour supervision/assistance      Frequency and Duration min 2x/week  1 week       Prognosis Prognosis for Safe Diet Advancement: Fair Barriers to Reach Goals:  (respiratory compromise)      Swallow Study   General Date of Onset: 05/06/21 HPI: 87 y.o. female with past medical history relevant for chronic anemia of iron deficiency, HTN, HFpEF/chronic diastolic dysfunction CHF and history of CAD who admitted from [facility] SNF on 9/47/0962 with acute hypoxic respiratory failure secondary to COVID-pneumonia as well as found to have UTI. RN noted coughing with liquids on Sunday, so a BSE was ordered. Type of Study: Bedside Swallow Evaluation Previous Swallow Assessment: BSE Aug 2022 reg/thin Diet Prior to this Study: Dysphagia 1 (puree);Thin liquids Temperature Spikes Noted: No Respiratory Status: Nasal cannula History of Recent Intubation: No Behavior/Cognition: Alert;Cooperative;Pleasant mood Oral Cavity Assessment: Within Functional Limits;Dry Oral Care Completed by SLP: Yes Oral Cavity - Dentition: Adequate natural dentition Vision: Functional for self-feeding Self-Feeding Abilities: Able to feed self Patient Positioning: Upright in bed Baseline Vocal Quality: Normal Volitional Cough: Strong;Congested (wheeze) Volitional Swallow: Able to elicit    Oral/Motor/Sensory Function Overall Oral Motor/Sensory Function: Within functional limits   Ice Chips Ice chips: Within functional limits Presentation: Spoon   Thin Liquid Thin Liquid: Impaired Presentation: Cup;Self Fed;Straw Pharyngeal  Phase Impairments: Cough - Immediate (one strong coughing episode after sequential straw sips thin)    Nectar Thick Nectar Thick Liquid: Within functional limits Presentation: Cup;Straw   Honey Thick Honey Thick Liquid: Not tested   Puree Puree: Within functional limits Presentation: Spoon   Solid      Solid: Within functional limits     Thank you,  Genene Churn, [place]  Torres Hardenbrook 05/10/2021,3:18 PM

## 2021-05-10 NOTE — TOC Initial Note (Signed)
Transition of Care [facility]) - Initial/Assessment Note    Patient Details  Name: Susan Glass MRN: [id number] Date of Birth: 07-11-1917  [facility]) CM/SW Contact:    Salome Arnt, [place] Phone Number: 05/10/2021, 9:18 AM  Clinical Narrative:  Pt admitted due to acute respiratory failure with hypoxia due to Covid PNA. LCSW spoke with pt's daughter-in-law, [name] to complete assessment as pt oriented to self only per chart. [name] reports pt has been a resident at [facility] for several years. She requests return to facility when medically stable. Per Jackelyn Poling at [facility], okay for return. Aware COVID +. Pt's son (only child) passed away last week due to [place]. [name] indicates pt was told, but she feels she is in denial. LCSW provided support to [place] and will continue to follow.                 Expected Discharge Plan: Long Term Nursing Home Barriers to Discharge: Continued Medical Work up   Patient Goals and CMS Choice Patient states their goals for this hospitalization and ongoing recovery are:: return to SNF      Expected Discharge Plan and Services Expected Discharge Plan: [place] In-house Referral: Clinical Social Work     Living arrangements for the past 2 months: [place]                 DME Arranged: N/A                    Prior Living Arrangements/Services Living arrangements for the past 2 months: [place] Lives with:: Facility Resident Patient language and need for interpreter reviewed:: Yes Do you feel safe going back to the place where you live?: Yes      Need for Family Participation in Patient Care: Yes (Comment) Care giver support system in place?: Yes (comment)      Activities of Daily Living Home Assistive Devices/Equipment: Environmental consultant (specify type) ADL Screening (condition at time of admission) Patient's cognitive ability adequate to safely complete daily activities?: No Is the patient  deaf or have difficulty hearing?: Yes Does the patient have difficulty seeing, even when wearing glasses/contacts?: No Does the patient have difficulty concentrating, remembering, or making decisions?: Yes Patient able to express need for assistance with ADLs?: Yes Does the patient have difficulty dressing or bathing?: Yes Independently performs ADLs?: No Communication: Independent Dressing (OT): Needs assistance Is this a change from baseline?: Pre-admission baseline Grooming: Needs assistance Is this a change from baseline?: Pre-admission baseline Feeding: Needs assistance Is this a change from baseline?: Pre-admission baseline Bathing: Needs assistance Is this a change from baseline?: Pre-admission baseline Toileting: Needs assistance Is this a change from baseline?: Pre-admission baseline In/Out Bed: Needs assistance Is this a change from baseline?: Pre-admission baseline Walks in Home: Needs assistance Is this a change from baseline?: Pre-admission baseline Does the patient have difficulty walking or climbing stairs?: Yes Weakness of Legs: Both Weakness of Arms/Hands: Both  Permission Sought/Granted Permission sought to share information with : Facility Art therapist granted to share information with : Yes, Verbal Permission Granted     Permission granted to share info w AGENCY: [name]  Permission granted to share info w Relationship: SNF     Emotional Assessment   Attitude/Demeanor/Rapport: Unable to Assess Affect (typically observed): Unable to Assess Orientation: : Oriented to Self Alcohol / Substance Use: Not Applicable Psych Involvement: No (comment)  Admission diagnosis:  Pneumonia [J18.9] Encephalopathy [G93.40] Hypoxia [R09.02]  Sepsis ([place]) [A41.9] Community acquired pneumonia, unspecified laterality [J18.9] Patient Active Problem List   Diagnosis Date Noted   Pneumonia 05/06/2021   COVID-19 virus infection 05/06/2021   Acute lower  UTI 05/06/2021   Acute respiratory failure with hypoxia due to Covid PNA 05/06/2021   Fever 03/19/2021   Abnormal LFTs 03/19/2021   Polycythemia 03/19/2021   Hyperlipidemia    CKD (chronic kidney disease) stage 3, GFR 30-59 ml/min (HCC)    Unspecified atrial fibrillation (HCC)    Dilation of biliary tract    Transaminitis    Constipation    Abnormal CT scan, esophagus    Diarrhea 03/29/2018   Angina pectoris, unstable ([place]) 47/82/9562   Metabolic encephalopathy 13/03/6577   Acute encephalopathy 12/27/2017   GERD (gastroesophageal reflux disease) 04/29/2015   Gastritis and gastroduodenitis 11/29/2014   Weakness 11/27/2014   Edema extremities 11/17/2010   CAD, NATIVE VESSEL 08/19/2009   COMBINED HEART FAILURE, CHRONIC 08/19/2009   HYPERCHOLESTEROLEMIA 08/18/2009   Essential hypertension 08/18/2009   PCP:  Caprice Renshaw, MD Pharmacy:   [facility] [place] [facility] [place] [id number] [place] [address] Phone: [phone] Fax: [phone]     Social Determinants of Health (SDOH) Interventions    Readmission Risk Interventions Readmission Risk Prevention Plan 05/10/2021 03/23/2021  Medication Screening - Complete  Transportation Screening Complete Complete  HRI or Home Care Consult Complete -  Social Work Consult for [place] Planning/Counseling Complete -  Palliative Care Screening Not Applicable -  Medication Review Press photographer) Complete -  Some recent data might be hidden

## 2021-05-10 NOTE — Progress Notes (Signed)
Patient Demographics:    Susan Glass, is a 87 y.o. female, DOB - 11/14/1916, ZDG:[id number]  Admit date - 05/06/2021   Admitting Physician Madilynne Mullan Denton Brick, MD  Outpatient Primary MD for the patient is Caprice Renshaw, MD  LOS - 4   Chief Complaint  Patient presents with   Altered Mental Status   Shortness of Breath        Subjective:    Susan Glass today has no fevers, no emesis,  No chest pain,  Resting comfortably, oral intake is fair --Anticipate discharge back to [facility] SNF in 1 to 2 days -    Assessment  & Plan :    Principal Problem:   Acute respiratory failure with hypoxia due to Covid PNA Active Problems:   COVID-19 virus infection   Pneumonia   Acute lower UTI   Essential hypertension   Hyperlipidemia  Brief Summary:- 87 y.o. female with past medical history relevant for chronic anemia of iron deficiency, HTN, HFpEF/chronic diastolic dysfunction CHF and history of CAD who admitted from [facility] on 5/95/6387 with acute hypoxic respiratory failure secondary to COVID-pneumonia as well as found to have UTI  A/p 1)Acute hypoxic respiratory failure secondary to COVID-19 infection/Pneumonia---  --Hypoxia is improving--- -Currently requiring 1 L of oxygen via nasal cannula The treatment plan and use of medications  for treatment of COVID-19 infection and possible side effects were discussed with patient/daughter in Law Susan Glass -----Patient and daughter in [place] Susan Glass verbalizes understanding and agrees to treatment protocols   --Patient is positive for COVID-19 infection, chest x-ray with findings of infiltrates/opacities,  patient is tachypneic/hypoxic and requiring continuous supplemental oxygen---patient meets criteria for initiation of Remdesivir AND Steroid therapy per protocol  --Check and trend inflammatory markers including D-dimer, ferritin and  CRP---also  follow CBC and CMP --Supplemental oxygen to keep O2 sats above 93% -Follow serial chest x-rays and ABGs as indicated --- Encourage prone positioning for More than 16 hours/day in increments of 2 to 3 hours at a time if able to tolerate --Attempt to maintain euvolemic state --Zinc and vitamin C as ordered -Albuterol inhaler as needed -Accu-Cheks/fingersticks while on high-dose steroids PPI for GI prophylaxis while on high-dose steroids COVID-19 Labs  Recent Labs    05/08/21 0710 05/09/21 0620 05/10/21 0622  DDIMER 1.01* 0.79* 0.75*  FERRITIN 99 95 81  CRP 1.4* 1.0* 0.7    Lab Results  Component Value Date   SARSCOV2NAA POSITIVE (A) 05/06/2021   [place] NEGATIVE 03/18/2021   2)Social/Ethics--- patient's only son who was 25 years old passed away from [place] infection on 05/05/2021 -Patient wishes to be a DNR/DNI with full scope of treatment -This was verified with patient's daughter in [place] Susan Glass discharge back to [facility] SN[facility] in 1 to 2 days   3)Suspicion of Sepsis secondary to presumed  UTI--UA suspicious for UTI,  --- on admission patient had tachycardia and tachypnea with hypoxia -Urine culture without growth -Sepsis ruled out SIRs pathophysiology was most likely due to COVID-19 respiratory failure with hypoxia -Stop IV Rocephin as urine culture is w/o growth -WBC 11>> 7.4 >> 9.6 -lactic acid not elevated, PCT-0.19   4)HTN--continue Amlodipine 5 mg daily, labetalol IV as needed elevated BP   5)Grief/Anxiety/Adjustment  Disorder due to death of patient's son--patient 9 year old son died from [place] infection on May 16, 2010 -Lorazepam as needed   6)H/o CAD/H/o HFpEF/chronic diastolic dysfunction CHF--- appears compensated -Echo from 04/03/2021 with EF of 50 to 55% -BNP noted -Troponin negative -No ACS type symptoms   7) CKD 3A---- renal function appears close to baseline - renally adjust medications, avoid nephrotoxic agents / dehydration  /  hypotension   8) chronic iron deficiency anemia----Hgb stable above 11,  --which is close to patient's baseline, no evidence of ongoing bleeding, continue iron supplementation  9)Dysphagia---?? Aspiration risk,  speech/swallow eval appreciated -Speech pathologist recommend dysphagia 2 (Fine chop);Nectar-thick liquid    Disposition/Need for in-Hospital Stay- patient unable to be discharged at this time due to --acute Hypoxic Resp Failure due to covid PNA -requiring IV remdesivir, IV steroids supplemental oxygen    Status is: Inpatient   Remains inpatient appropriate because: Please see disposition above   Dispo: The patient is from: SNF              Anticipated d/c is to: SNF  [name]              Anticipated d/c date is: 1 day              Patient currently is not medically stable to d/c. Barriers: Not Clinically Stable-     Code Status : -  Code Status: DNR   Family Communication:   patient is alert, awake and coherent) Discussed with daughter in law-Susan Glass  Consults  :  na  DVT Prophylaxis  :   - SCDs   SCDs Start: 05/06/21 1733 Place TED hose Start: 05/06/21 1733 enoxaparin (LOVENOX) injection 40 mg Start: 05/06/21 1230 SCDs Start: 05/06/21 1223    Lab Results  Component Value Date   PLT 165 05/10/2021    Inpatient Medications  Scheduled Meds:  amLODipine  5 mg Oral Daily   vitamin C  500 mg Oral Daily   aspirin  81 mg Oral Daily   calcium carbonate  500 mg of elemental calcium Oral Q breakfast   cholecalciferol  5,000 Units Oral Daily   divalproex  125 mg Oral BID   enoxaparin (LOVENOX) injection  40 mg Subcutaneous Q24H   ferrous sulfate  325 mg Oral Q breakfast   folic acid  1 mg Oral Daily   guaiFENesin  600 mg Oral BID   Ipratropium-Albuterol  1 puff Inhalation Q6H   isosorbide mononitrate  15 mg Oral Daily   melatonin  9 mg Oral QHS   multivitamin with minerals  1 tablet Oral Daily   pantoprazole  40 mg Oral Daily   polyethylene glycol  17 g Oral BID    predniSONE  50 mg Oral Daily   rOPINIRole  2 mg Oral QHS   senna-docusate  2 tablet Oral BID   sodium chloride flush  3 mL Intravenous Q12H   sodium chloride flush  3 mL Intravenous Q12H   tamsulosin  0.4 mg Oral Daily   thiamine  100 mg Oral Daily   zinc sulfate  220 mg Oral Daily   Continuous Infusions:  sodium chloride     PRN Meds:.sodium chloride, acetaminophen **OR** acetaminophen, acetaminophen, bisacodyl, chlorpheniramine-HYDROcodone, guaiFENesin-dextromethorphan, labetalol, LORazepam, nitroGLYCERIN, ondansetron **OR** ondansetron (ZOFRAN) IV, ondansetron, phenol, sodium chloride flush, traZODone    Anti-infectives (From admission, onward)    Start     Dose/Rate Route Frequency Ordered Stop   05/07/21 1000  remdesivir 100 mg in sodium chloride 0.9 %  100 mL IVPB  Status:  Discontinued       See Hyperspace for full Linked Orders Report.   100 mg 200 mL/hr over 30 Minutes Intravenous Daily 05/06/21 1225 05/06/21 1244   05/07/21 1000  remdesivir 100 mg in sodium chloride 0.9 % 100 mL IVPB        100 mg 200 mL/hr over 30 Minutes Intravenous Daily 05/06/21 1246 05/10/21 1128   05/07/21 0930  cefTRIAXone (ROCEPHIN) 1 g in sodium chloride 0.9 % 100 mL IVPB  Status:  Discontinued        1 g 200 mL/hr over 30 Minutes Intravenous Every 24 hours 05/06/21 1739 05/09/21 1244   05/06/21 1300  remdesivir 100 mg in sodium chloride 0.9 % 100 mL IVPB        100 mg 200 mL/hr over 30 Minutes Intravenous Every 30 min 05/06/21 1243 05/06/21 1445   05/06/21 1230  remdesivir 200 mg in sodium chloride 0.9% 250 mL IVPB  Status:  Discontinued       See Hyperspace for full Linked Orders Report.   200 mg 580 mL/hr over 30 Minutes Intravenous Once 05/06/21 1225 05/06/21 1245   05/06/21 0930  cefTRIAXone (ROCEPHIN) 1 g in sodium chloride 0.9 % 100 mL IVPB  Status:  Discontinued        1 g 200 mL/hr over 30 Minutes Intravenous Every 24 hours 05/06/21 0916 05/06/21 1739   05/06/21 0930  azithromycin  (ZITHROMAX) 500 mg in sodium chloride 0.9 % 250 mL IVPB  Status:  Discontinued        500 mg 250 mL/hr over 60 Minutes Intravenous Every 24 hours 05/06/21 0916 05/07/21 1203   05/06/21 0900  azithromycin (ZITHROMAX) 500 mg in sodium chloride 0.9 % 250 mL IVPB  Status:  Discontinued        500 mg 250 mL/hr over 60 Minutes Intravenous Every 24 hours 05/06/21 0852 05/06/21 0948   05/06/21 0800  cefTRIAXone (ROCEPHIN) 2 g in sodium chloride 0.9 % 100 mL IVPB        2 g 200 mL/hr over 30 Minutes Intravenous  Once 05/06/21 0747 05/06/21 0925         Objective:   Vitals:   05/10/21 0554 05/10/21 0950 05/10/21 1457 05/10/21 1500  BP: (!) 151/75  122/73   Pulse: 82  61   Resp: 19  18   Temp: 98.1 F (36.7 C)  97.9 F (36.6 C)   TempSrc: Oral     SpO2: 99% 98% 98% 98%  Weight:      Height:        Wt Readings from Last 3 Encounters:  05/06/21 70.3 kg  03/19/21 64.3 kg  05/11/18 57.2 kg     Intake/Output Summary (Last 24 hours) at 05/10/2021 1759 Last data filed at 05/10/2021 1330 Gross per 24 hour  Intake 480 ml  Output 550 ml  Net -70 ml     Physical Exam  Gen:- Awake Alert,  in no apparent distress  HEENT:- [place].AT, No sclera icterus Nose- [place] 1L/min Neck-Supple Neck,No JVD,.  Lungs-sounds rhonchorous, no significant wheezing or rales  CV- S1, S2 normal, regular  Abd-  +ve B.Sounds, Abd Soft, No tenderness,    Extremity/Skin:- No  edema, pedal pulses present  Psych-affect is anxious, oriented x3 Neuro-generalized weakness, no new focal deficits, no tremors   Data Review:   Micro Results Recent Results (from the past 240 hour(s))  Blood culture (routine x 2)     Status: None (  Preliminary result)   Collection Time: 05/06/21  7:33 AM   Specimen: Left Antecubital; Blood  Result Value Ref Range Status   Specimen Description LEFT ANTECUBITAL  Final   Special Requests   Final    BOTTLES DRAWN AEROBIC AND ANAEROBIC Blood Culture results may not be optimal due to an  excessive volume of blood received in culture bottles   Culture   Final    NO GROWTH 4 DAYS Performed at [facility], [address], [place], [place] [address]    Report Status PENDING  Incomplete  Blood culture (routine x 2)     Status: None (Preliminary result)   Collection Time: 05/06/21  7:35 AM   Specimen: BLOOD LEFT WRIST  Result Value Ref Range Status   Specimen Description BLOOD LEFT WRIST  Final   Special Requests   Final    BOTTLES DRAWN AEROBIC AND ANAEROBIC Blood Culture results may not be optimal due to an excessive volume of blood received in culture bottles   Culture   Final    NO GROWTH 4 DAYS Performed at [facility], [address], [place], [place] [address]    Report Status PENDING  Incomplete  Resp Panel by RT-PCR (Flu A&B, Covid) Nasopharyngeal Swab     Status: Abnormal   Collection Time: 05/06/21  8:02 AM   Specimen: Nasopharyngeal Swab; Nasopharyngeal(NP) swabs in vial transport medium  Result Value Ref Range Status   SARS Coronavirus 2 by RT PCR POSITIVE (A) NEGATIVE Final    Comment: BOOTH,B AT 0926 ON 9.22.22 BY RUCINSKI,B (NOTE) SARS-CoV-2 target nucleic acids are DETECTED.  The SARS-CoV-2 RNA is generally detectable in upper respiratory specimens during the acute phase of infection. Positive results are indicative of the presence of the identified virus, but do not rule out bacterial infection or co-infection with other pathogens not detected by the test. Clinical correlation with patient history and other diagnostic information is necessary to determine patient infection status. The expected result is Negative.  Fact Sheet for Patients: EntrepreneurPulse.com.au  Fact Sheet for Healthcare Providers: IncredibleEmployment.be  This test is not yet approved or cleared by the [place] FDA and  has been authorized for detection and/or diagnosis of SARS-CoV-2 by FDA under an Emergency Use Authorization (EUA).   This EUA will remain in effect (meaning this test can be used) for the duration of  the COVID-19 de claration under Section 564(b)(1) of the Act, 21 U.S.C. section 360bbb-3(b)(1), unless the authorization is terminated or revoked sooner.     Influenza A by PCR NEGATIVE NEGATIVE Final   Influenza B by PCR NEGATIVE NEGATIVE Final    Comment: (NOTE) The Xpert Xpress SARS-CoV-2/FLU/RSV plus assay is intended as an aid in the diagnosis of influenza from Nasopharyngeal swab specimens and should not be used as a sole basis for treatment. Nasal washings and aspirates are unacceptable for Xpert Xpress SARS-CoV-2/FLU/RSV testing.  Fact Sheet for Patients: EntrepreneurPulse.com.au  Fact Sheet for Healthcare Providers: IncredibleEmployment.be  This test is not yet approved or cleared by the [place] FDA and has been authorized for detection and/or diagnosis of SARS-CoV-2 by FDA under an Emergency Use Authorization (EUA). This EUA will remain in effect (meaning this test can be used) for the duration of the COVID-19 declaration under Section 564(b)(1) of the Act, 21 U.S.C. section 360bbb-3(b)(1), unless the authorization is terminated or revoked.  Performed at [facility], [address] [place], [place] [address]   Urine Culture     Status: Abnormal  Collection Time: 05/06/21  3:33 PM   Specimen: Urine, Clean Catch  Result Value Ref Range Status   Specimen Description   Final    URINE, CLEAN CATCH Performed at [facility], [address], [place], [place] [address]    Special Requests   Final    NONE Performed at [facility], [address] [place], [place] [address]    Culture MULTIPLE SPECIES PRESENT, SUGGEST RECOLLECTION (A)  Final   Report Status 05/08/2021 FINAL  Final  MRSA Next Gen by PCR, Nasal     Status: Abnormal   Collection Time: 05/06/21  6:26 PM   Specimen: Nasal Mucosa; Nasal Swab  Result Value Ref Range Status   MRSA by  PCR Next Gen DETECTED (A) NOT DETECTED Final    Comment: RESULT CALLED TO, READ BACK BY AND VERIFIED WITH: JUSTIN MIZE RN,2051,05/06/2021, SELF S (NOTE) The GeneXpert MRSA Assay (FDA approved for NASAL specimens only), is one component of a comprehensive MRSA colonization surveillance program. It is not intended to diagnose MRSA infection nor to guide or monitor treatment for MRSA infections. Test performance is not FDA approved in patients less than 85 years old. Performed at [facility], [address] [place],  [address]     Radiology Reports DG Chest 1 View  Result Date: 05/06/2021 CLINICAL DATA:  Shortness of breath, altered mental status EXAM: CHEST  1 VIEW COMPARISON:  Chest radiograph 03/20/2021 FINDINGS: The cardiomediastinal silhouette is stable. There is calcified atherosclerotic plaque of the aortic arch. There is new consolidative retrocardiac opacity as well as patchy opacity in the left midlung. There is a small left and suspected trace right pleural effusion. There is no focal consolidation on the right. There is no pneumothorax. There is degenerative change of both shoulders. There is no acute osseous abnormality. IMPRESSION: Consolidative retrocardiac opacity and patchy opacity in the left mid lung suspicious for pneumonia with a small left parapneumonic effusion. Recommend follow-up radiographs to resolution. Electronically Signed   By: Valetta Mole M.D.   On: 05/06/2021 08:48   CT HEAD WO CONTRAST (5MM)  Result Date: 05/06/2021 CLINICAL DATA:  Delirium, altered mental status EXAM: CT HEAD WITHOUT CONTRAST TECHNIQUE: Contiguous axial images were obtained from the base of the skull through the vertex without intravenous contrast. COMPARISON:  CT head 12/27/2017 FINDINGS: Brain: There is no evidence of acute intracranial hemorrhage, extra-axial fluid collection, or acute infarct. There is global parenchymal volume loss with commensurate enlargement of the ventricular  system and CSF spaces. Confluent hypodensity in the subcortical and periventricular white matter likely reflects sequela of chronic white matter microangiopathy. There is no mass lesion. There is no midline shift. Vascular: There is calcification of the bilateral cavernous ICAs. Skull: Normal. Negative for fracture or focal lesion. Sinuses/Orbits: There is complete opacification of the sphenoid sinuses with associated hyperostosis. There is layering fluid in the right sphenoid sinus. Bilateral lens implants are in place. The globes and orbits are otherwise unremarkable. Other: None IMPRESSION: 1. No acute intracranial pathology. 2. Global parenchymal volume loss and chronic white matter microangiopathy. 3. Chronic bilateral sphenoid sinusitis with layering fluid in the right sphenoid sinus which could reflect superimposed acute sinusitis in the correct clinical setting. Electronically Signed   By: Valetta Mole M.D.   On: 05/06/2021 08:40     CBC Recent Labs  Lab 05/06/21 0745 05/07/21 0550 05/08/21 0710 05/09/21 0620 05/10/21 0622  WBC 11.0* 7.4 10.8* 9.6 12.6*  HGB 11.7* 11.4* 11.5* 12.1 11.6*  HCT 36.5  36.5 36.5 38.4 37.4  PLT 185 177 176 192 165  MCV 95.8 94.3 94.3 94.1 96.9  MCH 30.7 29.5 29.7 29.7 30.1  MCHC 32.1 31.2 31.5 31.5 31.0  RDW 14.7 14.4 14.7 14.9 15.2  LYMPHSABS 2.0 0.4* 0.5* 0.3* 1.1  MONOABS 0.8 0.2 0.6 0.4 1.3*  EOSABS 0.6* 0.0 0.0 0.0 0.0  BASOSABS 0.0 0.0 0.0 0.0 0.0    Chemistries  Recent Labs  Lab 05/06/21 0745 05/07/21 0550 05/08/21 0710 05/09/21 0620 05/10/21 0622  NA 137 139 141 142 143  K 4.2 4.2 4.6 4.7 4.2  CL 105 106 109 110 111  CO2 26 22 24 26 25   GLUCOSE 95 176* 129* 134* 98  BUN 18 18 22 23  25*  CREATININE 0.86 0.74 0.71 0.74 0.69  CALCIUM 8.5* 8.7* 9.1 8.8* 8.8*  MG  --  2.3 2.4 2.6* 2.6*  AST 15 64* 53* 35 26  ALT 13 48* 49* 44 39  ALKPHOS 66 78 67 61 59  BILITOT 0.6 0.3 0.4 0.5 0.3    ------------------------------------------------------------------------------------------------------------------ No results for input(s): CHOL, HDL, LDLCALC, TRIG, CHOLHDL, LDLDIRECT in the last 72 hours.  No results found for: HGBA1C ------------------------------------------------------------------------------------------------------------------ No results for input(s): TSH, T4TOTAL, T3FREE, THYROIDAB in the last 72 hours.  Invalid input(s): FREET3 ------------------------------------------------------------------------------------------------------------------ Recent Labs    05/09/21 0620 05/10/21 0622  FERRITIN 95 81    Coagulation profile Recent Labs  Lab 05/06/21 0745  INR 1.2    Recent Labs    05/09/21 0620 05/10/21 0622  DDIMER 0.79* 0.75*    Cardiac Enzymes No results for input(s): CKMB, TROPONINI, MYOGLOBIN in the last 168 hours.  Invalid input(s): CK ------------------------------------------------------------------------------------------------------------------    Component Value Date/Time   BNP 529.0 (H) 05/06/2021 2440   Roxan Hockey M.D on 05/10/2021 at 5:59 PM  Go to [email or web address] - for contact info  [facility] - Office  [phone]

## 2021-05-10 NOTE — NC FL2 (Signed)
[place] LEVEL OF CARE SCREENING TOOL     IDENTIFICATION  Patient Name: KARIN PINEDO Birthdate: 20-Jan-1917 Sex: female Admission Date (Current Location): 05/06/2021  [facility] and [place] Number:  [employer] and Address:  [facility] [address] [place]      Provider Number: [id number]  Attending Physician Name and Address:  Roxan Hockey, MD  Relative Name and Phone Number:       Current Level of Care: Hospital Recommended Level of Care: Nursing Facility Prior Approval Number:    Date Approved/Denied:   PASRR Number:    Discharge Plan: SNF    Current Diagnoses: Patient Active Problem List   Diagnosis Date Noted   Pneumonia 05/06/2021   COVID-19 virus infection 05/06/2021   Acute lower UTI 05/06/2021   Acute respiratory failure with hypoxia due to Covid PNA 05/06/2021   Fever 03/19/2021   Abnormal LFTs 03/19/2021   Polycythemia 03/19/2021   Hyperlipidemia    CKD (chronic kidney disease) stage 3, GFR 30-59 ml/min (HCC)    Unspecified atrial fibrillation (HCC)    Dilation of biliary tract    Transaminitis    Constipation    Abnormal CT scan, esophagus    Diarrhea 03/29/2018   Angina pectoris, unstable ([place]) 99/35/7017   Metabolic encephalopathy 79/39/0300   Acute encephalopathy 12/27/2017   GERD (gastroesophageal reflux disease) 04/29/2015   Gastritis and gastroduodenitis 11/29/2014   Weakness 11/27/2014   Edema extremities 11/17/2010   CAD, NATIVE VESSEL 08/19/2009   COMBINED HEART FAILURE, CHRONIC 08/19/2009   HYPERCHOLESTEROLEMIA 08/18/2009   Essential hypertension 08/18/2009    Orientation RESPIRATION BLADDER Height & Weight     Self  O2 (3L) Incontinent Weight: 155 lb (70.3 kg) Height:  5\' 2"  (157.5 cm)  BEHAVIORAL SYMPTOMS/MOOD NEUROLOGICAL BOWEL NUTRITION STATUS      Incontinent Diet (Dysphagia 1 with thin liquids. See d/c summary for updates.)  AMBULATORY STATUS COMMUNICATION OF NEEDS  Skin   Extensive Assist Verbally Bruising                       Personal Care Assistance Level of Assistance  Bathing, Feeding, Dressing Bathing Assistance: Maximum assistance Feeding assistance: Limited assistance Dressing Assistance: Maximum assistance     Functional Limitations Info  Hearing, Sight, Speech Sight Info: Impaired Hearing Info: Impaired Speech Info: Adequate    SPECIAL CARE FACTORS FREQUENCY                       Contractures      Additional Factors Info  Code Status, Allergies, Psychotropic, Isolation Precautions Code Status Info: DNR Allergies Info: No known allergies. Psychotropic Info: Depakote, Ativan   Isolation Precautions Info: Airborne/contact (COVID positive)     Current Medications (05/10/2021):  This is the current hospital active medication list Current Facility-Administered Medications  Medication Dose Route Frequency Provider Last Rate Last Admin   0.9 %  sodium chloride infusion  250 mL Intravenous PRN Emokpae, Courage, MD       acetaminophen (TYLENOL) tablet 650 mg  650 mg Oral Q6H PRN Emokpae, Courage, MD   650 mg at 05/07/21 2126   Or   acetaminophen (TYLENOL) suppository 650 mg  650 mg Rectal Q6H PRN Emokpae, Courage, MD       acetaminophen (TYLENOL) tablet 650 mg  650 mg Oral Q8H PRN Emokpae, Courage, MD       amLODipine (NORVASC) tablet 5 mg  5 mg Oral  Daily Roxan Hockey, MD   5 mg at 05/09/21 0901   ascorbic acid (VITAMIN C) tablet 500 mg  500 mg Oral Daily Roxan Hockey, MD   500 mg at 05/09/21 1610   aspirin chewable tablet 81 mg  81 mg Oral Daily Emokpae, Courage, MD   81 mg at 05/09/21 0859   bisacodyl (DULCOLAX) suppository 10 mg  10 mg Rectal Daily PRN Roxan Hockey, MD       calcium carbonate (OS-CAL - dosed in mg of elemental calcium) tablet 500 mg of elemental calcium  500 mg of elemental calcium Oral Q breakfast Emokpae, Courage, MD   500 mg of elemental calcium at 05/09/21 0852    chlorpheniramine-HYDROcodone (TUSSIONEX) 10-8 MG/5ML suspension 5 mL  5 mL Oral Q12H PRN Roxan Hockey, MD       cholecalciferol (VITAMIN D3) tablet 5,000 Units  5,000 Units Oral Daily Emokpae, Courage, MD   5,000 Units at 05/09/21 0850   divalproex (DEPAKOTE) DR tablet 125 mg  125 mg Oral BID Roxan Hockey, MD   125 mg at 05/09/21 2130   enoxaparin (LOVENOX) injection 40 mg  40 mg Subcutaneous Q24H Emokpae, Courage, MD   40 mg at 05/09/21 1239   ferrous sulfate tablet 325 mg  325 mg Oral Q breakfast D[place] Brick, Courage, MD   325 mg at 96/04/54 0981   folic acid (FOLVITE) tablet 1 mg  1 mg Oral Daily Emokpae, Courage, MD   1 mg at 05/09/21 0900   guaiFENesin (MUCINEX) 12 hr tablet 600 mg  600 mg Oral BID Emokpae, Courage, MD   600 mg at 05/09/21 2130   guaiFENesin-dextromethorphan (ROBITUSSIN DM) 100-10 MG/5ML syrup 10 mL  10 mL Oral Q4H PRN Emokpae, Courage, MD       Ipratropium-Albuterol (COMBIVENT) respimat 1 puff  1 puff Inhalation Q6H Emokpae, Courage, MD   1 puff at 05/09/21 1933   isosorbide mononitrate (IMDUR) 24 hr tablet 15 mg  15 mg Oral Daily Emokpae, Courage, MD   15 mg at 05/09/21 0900   labetalol (NORMODYNE) injection 10 mg  10 mg Intravenous Q4H PRN Emokpae, Courage, MD       LORazepam (ATIVAN) tablet 0.5 mg  0.5 mg Oral Q6H PRN Emokpae, Courage, MD   0.5 mg at 05/09/21 2129   melatonin tablet 9 mg  9 mg Oral QHS Emokpae, Courage, MD   9 mg at 05/09/21 2130   multivitamin with minerals tablet 1 tablet  1 tablet Oral Daily Roxan Hockey, MD   1 tablet at 05/09/21 0855   nitroGLYCERIN (NITROSTAT) SL tablet 0.4 mg  0.4 mg Sublingual Q5 min PRN Emokpae, Courage, MD       ondansetron (ZOFRAN) tablet 4 mg  4 mg Oral Q6H PRN Emokpae, Courage, MD       Or   ondansetron (ZOFRAN) injection 4 mg  4 mg Intravenous Q6H PRN Emokpae, Courage, MD       ondansetron (ZOFRAN) tablet 4 mg  4 mg Oral Q8H PRN Emokpae, Courage, MD       pantoprazole (PROTONIX) EC tablet 40 mg  40 mg Oral Daily  Emokpae, Courage, MD   40 mg at 05/09/21 0855   phenol (CHLORASEPTIC) mouth spray 1 spray  1 spray Mouth/Throat PRN Emokpae, Courage, MD       polyethylene glycol (MIRALAX / GLYCOLAX) packet 17 g  17 g Oral BID Emokpae, Courage, MD   17 g at 05/09/21 2130   predniSONE (DELTASONE) tablet 50 mg  50 mg Oral  Daily Emokpae, Courage, MD   50 mg at 05/09/21 0851   remdesivir 100 mg in sodium chloride 0.9 % 100 mL IVPB  100 mg Intravenous Daily Emokpae, Courage, MD 200 mL/hr at 05/09/21 0923 100 mg at 05/09/21 0923   rOPINIRole (REQUIP) tablet 2 mg  2 mg Oral QHS Emokpae, Courage, MD   2 mg at 05/09/21 2129   senna-docusate (Senokot-S) tablet 2 tablet  2 tablet Oral BID Roxan Hockey, MD   2 tablet at 05/09/21 2130   sodium chloride flush (NS) 0.9 % injection 3 mL  3 mL Intravenous Q12H Emokpae, Courage, MD   3 mL at 05/08/21 1056   sodium chloride flush (NS) 0.9 % injection 3 mL  3 mL Intravenous PRN Emokpae, Courage, MD   3 mL at 05/07/21 2128   sodium chloride flush (NS) 0.9 % injection 3 mL  3 mL Intravenous Q12H Emokpae, Courage, MD   3 mL at 05/09/21 2130   tamsulosin (FLOMAX) capsule 0.4 mg  0.4 mg Oral Daily Emokpae, Courage, MD   0.4 mg at 05/09/21 0901   thiamine tablet 100 mg  100 mg Oral Daily Emokpae, Courage, MD   100 mg at 05/09/21 0855   traZODone (DESYREL) tablet 50 mg  50 mg Oral QHS PRN Roxan Hockey, MD   50 mg at 05/09/21 2130   zinc sulfate capsule 220 mg  220 mg Oral Daily Emokpae, Courage, MD   220 mg at 05/09/21 0902     Discharge Medications: Please see discharge summary for a list of discharge medications.  Relevant Imaging Results:  Relevant Lab Results:   Additional Information    Salome Arnt, LCSW

## 2021-05-11 DIAGNOSIS — J9601 Acute respiratory failure with hypoxia: Secondary | ICD-10-CM | POA: Diagnosis not present

## 2021-05-11 LAB — CULTURE, BLOOD (ROUTINE X 2)
Culture: NO GROWTH
Culture: NO GROWTH

## 2021-05-11 LAB — COMPREHENSIVE METABOLIC PANEL
ALT: 32 U/L (ref 0–44)
AST: 18 U/L (ref 15–41)
Albumin: 3.1 g/dL — ABNORMAL LOW (ref 3.5–5.0)
Alkaline Phosphatase: 55 U/L (ref 38–126)
Anion gap: 6 (ref 5–15)
BUN: 21 mg/dL (ref 8–23)
CO2: 27 mmol/L (ref 22–32)
Calcium: 8.6 mg/dL — ABNORMAL LOW (ref 8.9–10.3)
Chloride: 111 mmol/L (ref 98–111)
Creatinine, Ser: 0.62 mg/dL (ref 0.44–1.00)
GFR, Estimated: >60 mL/min (ref >60)
Glucose, Bld: 102 mg/dL — ABNORMAL HIGH (ref 70–99)
Potassium: 4.2 mmol/L (ref 3.5–5.1)
Sodium: 144 mmol/L (ref 135–145)
Total Bilirubin: 0.5 mg/dL (ref 0.3–1.2)
Total Protein: 5.6 g/dL — ABNORMAL LOW (ref 6.5–8.1)

## 2021-05-11 LAB — CBC WITH DIFFERENTIAL/PLATELET
Abs Immature Granulocytes: 0.27 10*3/uL — ABNORMAL HIGH (ref 0.00–0.07)
Basophils Absolute: 0 10*3/uL (ref 0.0–0.1)
Basophils Relative: 0 %
Eosinophils Absolute: 0 10*3/uL (ref 0.0–0.5)
Eosinophils Relative: 0 %
HCT: 36.3 % (ref 36.0–46.0)
Hemoglobin: 11.2 g/dL — ABNORMAL LOW (ref 12.0–15.0)
Immature Granulocytes: 4 %
Lymphocytes Relative: 11 %
Lymphs Abs: 0.9 10*3/uL (ref 0.7–4.0)
MCH: 29.6 pg (ref 26.0–34.0)
MCHC: 30.9 g/dL (ref 30.0–36.0)
MCV: 95.8 fL (ref 80.0–100.0)
Monocytes Absolute: 0.7 10*3/uL (ref 0.1–1.0)
Monocytes Relative: 9 %
Neutro Abs: 5.7 10*3/uL (ref 1.7–7.7)
Neutrophils Relative %: 76 %
Platelets: 137 10*3/uL — ABNORMAL LOW (ref 150–400)
RBC: 3.79 MIL/uL — ABNORMAL LOW (ref 3.87–5.11)
RDW: 14.8 % (ref 11.5–15.5)
WBC: 7.5 10*3/uL (ref 4.0–10.5)
nRBC: 0 % (ref 0.0–0.2)

## 2021-05-11 LAB — FERRITIN: Ferritin: 81 ng/mL (ref 11–307)

## 2021-05-11 LAB — MAGNESIUM: Magnesium: 2.6 mg/dL — ABNORMAL HIGH (ref 1.7–2.4)

## 2021-05-11 LAB — PHOSPHORUS: Phosphorus: 3 mg/dL (ref 2.5–4.6)

## 2021-05-11 LAB — D-DIMER, QUANTITATIVE: D-Dimer, Quant: 0.62 ug/mL-FEU — ABNORMAL HIGH (ref 0.00–0.50)

## 2021-05-11 LAB — C-REACTIVE PROTEIN: CRP: 1.1 mg/dL — ABNORMAL HIGH (ref <1.0)

## 2021-05-11 MED ORDER — LORAZEPAM 0.5 MG PO TABS: 0.5000 mg | ORAL_TABLET | Freq: Four times a day (QID) | ORAL | 0 refills | Status: AC | PRN

## 2021-05-11 MED ORDER — PREDNISONE 50 MG PO TABS: 50.0000 mg | ORAL_TABLET | Freq: Every day | ORAL | 0 refills | Status: AC

## 2021-05-11 MED ORDER — GUAIFENESIN-DM 100-10 MG/5ML PO SYRP: 10.0000 mL | ORAL_SOLUTION | ORAL | 0 refills | Status: AC | PRN

## 2021-05-11 MED ORDER — ASCORBIC ACID 500 MG PO TABS: 500.0000 mg | ORAL_TABLET | Freq: Every day | ORAL | 1 refills | Status: AC

## 2021-05-11 MED ORDER — FOLIC ACID 1 MG PO TABS: 1.0000 mg | ORAL_TABLET | Freq: Every day | ORAL | 2 refills | Status: AC

## 2021-05-11 MED ORDER — STIOLTO RESPIMAT 2.5-2.5 MCG/ACT IN AERS: 1.0000 | INHALATION_SPRAY | Freq: Two times a day (BID) | RESPIRATORY_TRACT | 1 refills | Status: AC

## 2021-05-11 MED ORDER — THIAMINE HCL 100 MG PO TABS: 100.0000 mg | ORAL_TABLET | Freq: Every day | ORAL | 1 refills | Status: AC

## 2021-05-11 MED ORDER — ADULT MULTIVITAMIN W/MINERALS CH: 1.0000 | ORAL_TABLET | Freq: Every day | ORAL | 3 refills | Status: AC

## 2021-05-11 MED ORDER — PANTOPRAZOLE SODIUM 40 MG PO TBEC: 40.0000 mg | DELAYED_RELEASE_TABLET | Freq: Every day | ORAL | 2 refills | Status: AC

## 2021-05-11 MED ORDER — ZINC SULFATE 220 (50 ZN) MG PO CAPS: 220.0000 mg | ORAL_CAPSULE | Freq: Every day | ORAL | 1 refills | Status: AC

## 2021-05-11 MED ORDER — GUAIFENESIN ER 600 MG PO TB12: 600.0000 mg | ORAL_TABLET | Freq: Two times a day (BID) | ORAL | 0 refills | Status: AC

## 2021-05-11 NOTE — Discharge Instructions (Signed)
1)Avoid ibuprofen/Advil/Aleve/Motrin/Goody Powders/Naproxen/BC powders/Meloxicam/Diclofenac/Indomethacin and other Nonsteroidal anti-inflammatory medications as these will make you more likely to bleed and can cause stomach ulcers, can also cause Kidney problems.   2) consider palliative/hospice consult  3) keep supplemental oxygen at 2 L/min via nasal cannula continuously  4)Dysphagia 2 (fine chop);Nectar-thick liquid Liquids provided via: Cup;Straw Medication Administration: Whole meds with puree Supervision: Patient able to self feed Compensations: Slow rate;Small sips/bites Postural Changes and/or Swallow Maneuvers: Seated upright 90 degrees

## 2021-05-11 NOTE — Discharge Summary (Signed)
Susan Glass, is a 87 y.o. female  DOB 1917/03/05  MRN [id number].  Admission date:  05/06/2021  Admitting Physician  Susan Hockey, MD  Discharge Date:  05/11/2021   Primary MD  Susan Renshaw, MD  Recommendations for primary care physician for things to follow:   1)Avoid ibuprofen/Advil/Aleve/Motrin/Goody Powders/Naproxen/BC powders/Meloxicam/Diclofenac/Indomethacin and other Nonsteroidal anti-inflammatory medications as these will make you more likely to bleed and can cause stomach ulcers, can also cause Kidney problems.   2) consider palliative/hospice consult  3) keep supplemental oxygen at 2 L/min via nasal cannula continuously  4)Dysphagia 2 (fine chop);Nectar-thick liquid Liquids provided via: Cup;Straw Medication Administration: Whole meds with puree Supervision: Patient able to self feed Compensations: Slow rate;Small sips/bites Postural Changes and/or Swallow Maneuvers: Seated upright 90 degrees   Admission Diagnosis  Pneumonia [J18.9] Encephalopathy [G93.40] Hypoxia [R09.02] Sepsis ([place]) [A41.9] Community acquired pneumonia, unspecified laterality [J18.9]   Discharge Diagnosis  Pneumonia [J18.9] Encephalopathy [G93.40] Hypoxia [R09.02] Sepsis ([place]) [A41.9] Community acquired pneumonia, unspecified laterality [J18.9]    Principal Problem:   Acute respiratory failure with hypoxia due to Covid PNA Active Problems:   COVID-19 virus infection   Pneumonia   Essential hypertension   Hyperlipidemia      Past Medical History:  Diagnosis Date   CAD (coronary artery disease)    CHF (congestive heart failure) (HCC)    GERD (gastroesophageal reflux disease)    GI bleed    UGI   Hiatal hernia    large, sliding    Hyperlipidemia    hypercholesterolemia   Hypertension    IDA (iron deficiency anemia)     Past Surgical History:  Procedure Laterality Date   APPENDECTOMY      CHOLECYSTECTOMY     COLONOSCOPY  2008   Dr. Gala Glass: Hyperplastic polyp removed, oozing erosion at the ileocecal valve with benign pathology, likely NSAID related.   ERCP N/A 03/22/2021   Procedure: ENDOSCOPIC RETROGRADE CHOLANGIOPANCREATOGRAPHY (ERCP);  Surgeon: Susan Houston, MD;  Location: [facility];  Service: Gastroenterology;  Laterality: N/A;   ESOPHAGEAL DILATION N/A 11/28/2014   Procedure: ESOPHAGEAL DILATION;  Surgeon: Susan Binder, MD;  Location: [facility];  Service: Endoscopy;  Laterality: N/A;   ESOPHAGOGASTRODUODENOSCOPY N/A 11/28/2014   Dr.Fields- UGI bleed d/t severe distal erosive esophagitis in the setting of GE JXN stricture, large sliding hiatal hernia, mild non-erosive gastritis   SPHINCTEROTOMY N/A 03/22/2021   Procedure: SPHINCTEROTOMY;  Surgeon: Susan Houston, MD;  Location: [facility];  Service: Gastroenterology;  Laterality: N/A;       HPI  from the history and physical done on the day of admission:     Susan Glass  is a 87 y.o. female with past medical history relevant for chronic anemia of iron deficiency, HTN, HFpEF/chronic diastolic dysfunction CHF and history of CAD who [facility] with fevers, cough, congestion and respiratory distress with shortness of breath over the last 24 to 48 hours -In the ED patient was found to be hypoxic requiring up to 4  L of oxygen -Chest x-ray was suggestive of pneumonia COVID-19 test is positive - Patient's 36 year old son died on 05/22/2021 from COVID-pneumonia however he has not been around patient for at least the last couple of months - -Additional history obtained from patient's daughter-in-law [name]   No Nausea, Vomiting or Diarrhea   -- In the ED UA suggestive of UTI, PCT 0.19 -Inflammatory markers requested and pending lactic acid not elevated -Troponin is not elevated, BNP is higher than prior baseline   Hospital Course:    Brief Summary:- 87 y.o. female with past medical  history relevant for chronic anemia of iron deficiency, HTN, HFpEF/chronic diastolic dysfunction CHF and history of CAD who admitted from [facility] on 7/51/0258 with acute hypoxic respiratory failure secondary to COVID-pneumonia as well as found to have UTI   A/p 1)Acute hypoxic respiratory failure secondary to COVID-19 infection/Pneumonia---  --Hypoxia is improving--- -Currently requiring 1 to 2 L of oxygen via nasal cannula The treatment plan and use of medications  for treatment of COVID-19 infection and possible side effects were discussed with patient/daughter in Law Ms Susan [facility] -----Patient and daughter in [place] Ms Susan Glass verbalizes understanding and agrees to treatment protocols   --Patient is positive for COVID-19 infection, chest x-ray with findings of infiltrates/opacities,  patient is tachypneic/hypoxic and requiring continuous supplemental oxygen---patient meets criteria for initiation of Remdesivir AND Steroid therapy per protocol  --Patient completed IV Remdesivir and Completed IV Solu-Medrol -Okay to discharge on p.o. prednisone --Zinc and vitamin C as ordered -Albuterol inhaler as needed COVID-19 Labs  Recent Labs    05/09/21 0620 05/10/21 0622 05/11/21 0545  DDIMER 0.79* 0.75* 0.62*  FERRITIN 95 81 81  CRP 1.0* 0.7 1.1*   Lab Results  Component Value Date   SARSCOV2NAA POSITIVE (A) 05/06/2021   [place] NEGATIVE 03/18/2021    2)Social/Ethics--- patient's only son who was 1 years old passed away from [place] infection on May 22, 2021 -Patient wishes to be a DNR/DNI with full scope of treatment -This was verified with patient's daughter in [place] Ms Susan Glass - discharge back to [facility]    3)Suspicion of Sepsis secondary to presumed  UTI--UA suspicious for UTI,  --- on admission patient had tachycardia and tachypnea with hypoxia -Urine culture without growth -Sepsis ruled out SIRs pathophysiology was most likely due to COVID-19 respiratory failure  with hypoxia -Stopped IV Rocephin as urine culture is w/o growth -WBC 11>> 7.4 >> 9.6 -lactic acid not elevated, PCT-0.19   4)HTN--continue Amlodipine 5 mg daily,    5)Grief/Anxiety/Adjustment Disorder due to death of patient's son--patient 33 year old son died from [place] infection on 2010/05/22 -Lorazepam as needed   6)H/o CAD/H/o HFpEF/chronic diastolic dysfunction CHF--- appears compensated -Echo from 04/03/2021 with EF of 50 to 55% -BNP noted -Troponin negative -No ACS type symptoms   7) CKD 3A---- renal function appears close to baseline - renally adjust medications, avoid nephrotoxic agents / dehydration  / hypotension   8) chronic iron deficiency anemia----Hgb stable above 11,  --which is close to patient's baseline, no evidence of ongoing bleeding, continue iron supplementation   9)Dysphagia---?? Aspiration risk,  speech/swallow eval appreciated -Speech pathologist recommend dysphagia 2 (Fine chop);Nectar-thick liquid  -Speech pathologist at facility to reevaluate patient and hopefully adjust diet as needed   Disposition--discharge to [facility]    Dispo: The patient is from: [facility]              [facility]  Code Status : -  Code Status: DNR    Family Communication:   patient is alert, awake and coherent) Discussed with daughter in law-[name]   Consults  :  na  Discharge Condition: Stable--- continue supplemental oxygen at 2 L/min via nasal cannula  Follow UP--- speech pathologist, hospice and palliative consult advised   Consults obtained -speech pathology  Diet and Activity recommendation:  As advised  Discharge Instructions    Discharge Instructions     Call MD for:  difficulty breathing, headache or visual disturbances   Complete by: As directed    Call MD for:  extreme fatigue   Complete by: As directed    Call MD for:  persistant nausea and vomiting   Complete by: As directed    Call MD for:  severe uncontrolled pain    Complete by: As directed    Call MD for:  temperature >100.4   Complete by: As directed    Diet general   Complete by: As directed    Dysphagia 2 (fine chop);Nectar-thick liquid Liquids provided via: Cup;Straw Medication Administration: Whole meds with puree Supervision: Patient able to self feed Compensations: Slow rate;Small sips/bites Postural Changes and/or Swallow Maneuvers: Seated upright 90 degrees   Discharge instructions   Complete by: As directed    1)Avoid ibuprofen/Advil/Aleve/Motrin/Goody Powders/Naproxen/BC powders/Meloxicam/Diclofenac/Indomethacin and other Nonsteroidal anti-inflammatory medications as these will make you more likely to bleed and can cause stomach ulcers, can also cause Kidney problems.   2) consider palliative/hospice consult  3) keep supplemental oxygen at 2 L/min via nasal cannula continuously  4)Dysphagia 2 (fine chop);Nectar-thick liquid Liquids provided via: Cup;Straw Medication Administration: Whole meds with puree Supervision: Patient able to self feed Compensations: Slow rate;Small sips/bites Postural Changes and/or Swallow Maneuvers: Seated upright 90 degrees   Increase activity slowly   Complete by: As directed          Discharge Medications     Allergies as of 05/11/2021   No Known Allergies      Medication List     STOP taking these medications    aMILoride 5 MG tablet Commonly known as: MIDAMOR       TAKE these medications    acetaminophen 500 MG tablet Commonly known as: TYLENOL Take 650 mg by mouth every 8 (eight) hours as needed for mild pain, moderate pain, headache or fever.   amLODipine 5 MG tablet Commonly known as: NORVASC Take 5 mg by mouth daily.   ascorbic acid 500 MG tablet Commonly known as: VITAMIN C Take 1 tablet (500 mg total) by mouth daily. Start taking on: May 12, 2021   aspirin 81 MG chewable tablet Chew 81 mg by mouth daily.   calcium carbonate 1500 (600 Ca) MG Tabs  tablet Commonly known as: OSCAL Take 600 mg of elemental calcium by mouth daily with breakfast.   CITRACAL PLUS PO Take 1 tablet by mouth daily.   Cranberry 450 MG Tabs Take 1 tablet by mouth in the morning and at bedtime.   divalproex 125 MG DR tablet Commonly known as: DEPAKOTE Take 125 mg by mouth 2 (two) times daily.   famotidine 20 MG tablet Commonly known as: PEPCID Take 20 mg by mouth 2 (two) times daily.   ferrous sulfate 325 (65 FE) MG tablet Commonly known as: FerrouSul Take 1 tablet (325 mg total) by mouth daily with breakfast.   folic acid 1 MG tablet Commonly known as: FOLVITE Take 1 tablet (1 mg total) by mouth daily. Start taking on:  May 12, 2021   furosemide 80 MG tablet Commonly known as: LASIX TAKE ONE-HALF TABLET BY MOUTH ONCE DAILY   guaiFENesin 600 MG 12 hr tablet Commonly known as: MUCINEX Take 1 tablet (600 mg total) by mouth 2 (two) times daily.   guaiFENesin-dextromethorphan 100-10 MG/5ML syrup Commonly known as: ROBITUSSIN DM Take 10 mLs by mouth every 4 (four) hours as needed for cough.   isosorbide mononitrate 30 MG 24 hr tablet Commonly known as: IMDUR Take 0.5 tablets (15 mg total) by mouth daily.   LORazepam 0.5 MG tablet Commonly known as: ATIVAN Take 0.5 mg by mouth every 6 (six) hours as needed for anxiety.   melatonin 5 MG Tabs Take 10 mg by mouth at bedtime.   multivitamin with minerals Tabs tablet Take 1 tablet by mouth daily. Start taking on: May 12, 2021   nitroGLYCERIN 0.4 MG SL tablet Commonly known as: NITROSTAT Place 1 tablet (0.4 mg total) under the tongue every 5 (five) minutes as needed for chest pain.   ondansetron 4 MG tablet Commonly known as: ZOFRAN Take 4 mg by mouth every 8 (eight) hours as needed for nausea or vomiting.   pantoprazole 40 MG tablet Commonly known as: PROTONIX Take 1 tablet (40 mg total) by mouth daily. Start taking on: May 12, 2021   phenol 1.4 % Liqd Commonly  known as: CHLORASEPTIC Use as directed 1 spray in the mouth or throat as needed for throat irritation / pain.   polyethylene glycol 17 g packet Commonly known as: MIRALAX / GLYCOLAX Take 17 g by mouth 2 (two) times daily. What changed: when to take this   predniSONE 50 MG tablet Commonly known as: DELTASONE Take 1 tablet (50 mg total) by mouth daily with breakfast.   rOPINIRole 2 MG tablet Commonly known as: REQUIP Take 2 mg by mouth at bedtime.   senna-docusate 8.6-50 MG tablet Commonly known as: Senokot-S Take 2 tablets by mouth 2 (two) times daily.   Stiolto Respimat 2.5-2.5 MCG/ACT Aers Generic drug: Tiotropium Bromide-Olodaterol Inhale 1 Inhaler into the lungs 2 (two) times daily.   tamsulosin 0.4 MG Caps capsule Commonly known as: FLOMAX Take 0.4 mg by mouth daily.   thiamine 100 MG tablet Take 1 tablet (100 mg total) by mouth daily. Start taking on: May 12, 2021   Vitamin D-3 125 MCG (5000 UT) Tabs Take 1 tablet by mouth daily.   zinc sulfate 220 (50 Zn) MG capsule Take 1 capsule (220 mg total) by mouth daily. Start taking on: May 12, 2021        Major procedures and Radiology Reports - PLEASE review detailed and final reports for all details, in brief -   DG Chest 1 View  Result Date: 05/06/2021 CLINICAL DATA:  Shortness of breath, altered mental status EXAM: CHEST  1 VIEW COMPARISON:  Chest radiograph 03/20/2021 FINDINGS: The cardiomediastinal silhouette is stable. There is calcified atherosclerotic plaque of the aortic arch. There is new consolidative retrocardiac opacity as well as patchy opacity in the left midlung. There is a small left and suspected trace right pleural effusion. There is no focal consolidation on the right. There is no pneumothorax. There is degenerative change of both shoulders. There is no acute osseous abnormality. IMPRESSION: Consolidative retrocardiac opacity and patchy opacity in the left mid lung suspicious for pneumonia  with a small left parapneumonic effusion. Recommend follow-up radiographs to resolution. Electronically Signed   By: Valetta Mole M.D.   On: 05/06/2021 08:48   CT HEAD  WO CONTRAST (5MM)  Result Date: 05/06/2021 CLINICAL DATA:  Delirium, altered mental status EXAM: CT HEAD WITHOUT CONTRAST TECHNIQUE: Contiguous axial images were obtained from the base of the skull through the vertex without intravenous contrast. COMPARISON:  CT head 12/27/2017 FINDINGS: Brain: There is no evidence of acute intracranial hemorrhage, extra-axial fluid collection, or acute infarct. There is global parenchymal volume loss with commensurate enlargement of the ventricular system and CSF spaces. Confluent hypodensity in the subcortical and periventricular white matter likely reflects sequela of chronic white matter microangiopathy. There is no mass lesion. There is no midline shift. Vascular: There is calcification of the bilateral cavernous ICAs. Skull: Normal. Negative for fracture or focal lesion. Sinuses/Orbits: There is complete opacification of the sphenoid sinuses with associated hyperostosis. There is layering fluid in the right sphenoid sinus. Bilateral lens implants are in place. The globes and orbits are otherwise unremarkable. Other: None IMPRESSION: 1. No acute intracranial pathology. 2. Global parenchymal volume loss and chronic white matter microangiopathy. 3. Chronic bilateral sphenoid sinusitis with layering fluid in the right sphenoid sinus which could reflect superimposed acute sinusitis in the correct clinical setting. Electronically Signed   By: Valetta Mole M.D.   On: 05/06/2021 08:40    Micro Results   Recent Results (from the past 240 hour(s))  Blood culture (routine x 2)     Status: None   Collection Time: 05/06/21  7:33 AM   Specimen: Left Antecubital; Blood  Result Value Ref Range Status   Specimen Description LEFT ANTECUBITAL  Final   Special Requests   Final    BOTTLES DRAWN AEROBIC AND ANAEROBIC  Blood Culture results may not be optimal due to an excessive volume of blood received in culture bottles   Culture   Final    NO GROWTH 5 DAYS Performed at [facility], [address] [place], [place] [address]    Report Status 05/11/2021 FINAL  Final  Blood culture (routine x 2)     Status: None   Collection Time: 05/06/21  7:35 AM   Specimen: BLOOD LEFT WRIST  Result Value Ref Range Status   Specimen Description BLOOD LEFT WRIST  Final   Special Requests   Final    BOTTLES DRAWN AEROBIC AND ANAEROBIC Blood Culture results may not be optimal due to an excessive volume of blood received in culture bottles   Culture   Final    NO GROWTH 5 DAYS Performed at [facility], [address] [place], [place] [address]    Report Status 05/11/2021 FINAL  Final  Resp Panel by RT-PCR (Flu A&B, Covid) Nasopharyngeal Swab     Status: Abnormal   Collection Time: 05/06/21  8:02 AM   Specimen: Nasopharyngeal Swab; Nasopharyngeal(NP) swabs in vial transport medium  Result Value Ref Range Status   SARS Coronavirus 2 by RT PCR POSITIVE (A) NEGATIVE Final    Comment: BOOTH,B AT 0926 ON 9.22.22 BY RUCINSKI,B (NOTE) SARS-CoV-2 target nucleic acids are DETECTED.  The SARS-CoV-2 RNA is generally detectable in upper respiratory specimens during the acute phase of infection. Positive results are indicative of the presence of the identified virus, but do not rule out bacterial infection or co-infection with other pathogens not detected by the test. Clinical correlation with patient history and other diagnostic information is necessary to determine patient infection status. The expected result is Negative.  Fact Sheet for Patients: EntrepreneurPulse.com.au  Fact Sheet for Healthcare Providers: IncredibleEmployment.be  This test is not yet approved or cleared by the [place] FDA  and  has been authorized for detection and/or diagnosis of SARS-CoV-2 by FDA under an  Emergency Use Authorization (EUA).  This EUA will remain in effect (meaning this test can be used) for the duration of  the COVID-19 de claration under Section 564(b)(1) of the Act, 21 U.S.C. section 360bbb-3(b)(1), unless the authorization is terminated or revoked sooner.     Influenza A by PCR NEGATIVE NEGATIVE Final   Influenza B by PCR NEGATIVE NEGATIVE Final    Comment: (NOTE) The Xpert Xpress SARS-CoV-2/FLU/RSV plus assay is intended as an aid in the diagnosis of influenza from Nasopharyngeal swab specimens and should not be used as a sole basis for treatment. Nasal washings and aspirates are unacceptable for Xpert Xpress SARS-CoV-2/FLU/RSV testing.  Fact Sheet for Patients: EntrepreneurPulse.com.au  Fact Sheet for Healthcare Providers: IncredibleEmployment.be  This test is not yet approved or cleared by the [place] FDA and has been authorized for detection and/or diagnosis of SARS-CoV-2 by FDA under an Emergency Use Authorization (EUA). This EUA will remain in effect (meaning this test can be used) for the duration of the COVID-19 declaration under Section 564(b)(1) of the Act, 21 U.S.C. section 360bbb-3(b)(1), unless the authorization is terminated or revoked.  Performed at [facility], [address] [place], [place] [address]   Urine Culture     Status: Abnormal   Collection Time: 05/06/21  3:33 PM   Specimen: Urine, Clean Catch  Result Value Ref Range Status   Specimen Description   Final    URINE, CLEAN CATCH Performed at [facility], [address] [place], [place] [address]    Special Requests   Final    NONE Performed at [facility], [address] [place], [place] [address]    Culture MULTIPLE SPECIES PRESENT, SUGGEST RECOLLECTION (A)  Final   Report Status 05/08/2021 FINAL  Final  MRSA Next Gen by PCR, Nasal     Status: Abnormal   Collection Time: 05/06/21  6:26 PM   Specimen: Nasal Mucosa; Nasal Swab   Result Value Ref Range Status   MRSA by PCR Next Gen DETECTED (A) NOT DETECTED Final    Comment: RESULT CALLED TO, READ BACK BY AND VERIFIED WITH: JUSTIN MIZE RN,2051,05/06/2021, SELF S (NOTE) The GeneXpert MRSA Assay (FDA approved for NASAL specimens only), is one component of a comprehensive MRSA colonization surveillance program. It is not intended to diagnose MRSA infection nor to guide or monitor treatment for MRSA infections. Test performance is not FDA approved in patients less than 51 years old. Performed at [facility], [address], [place], [place] [address]        Today   Subjective    [facility] today has no new complaints No fever  Or chills  -Speech pathologist at bedside No Nausea, Vomiting or Diarrhea -Cough and shortness of breath improving -Currently requiring 1 to 2 L of oxygen via nasal cannula        Patient has been seen and examined prior to discharge   Objective   Blood pressure 131/72, pulse 60, temperature 97.8 F (36.6 C), temperature source Oral, resp. rate 20, height 5\' 2"  (1.575 m), weight 70.3 kg, SpO2 95 %.   Intake/Output Summary (Last 24 hours) at 05/11/2021 1427 Last data filed at 05/11/2021 1030 Gross per 24 hour  Intake 243 ml  Output --  Net 243 ml    Exam HEENT:- [place].AT, No sclera icterus Nose- [place] 1L/min Neck-Supple Neck,No JVD,.  Lungs-improved air movement, no wheezing no rales CV-  S1, S2 normal, regular  Abd-  +ve B.Sounds, Abd Soft, No tenderness,    Extremity/Skin:- No  edema, pedal pulses present  Psych-affect is anxious, oriented x3 Neuro-generalized weakness, no new focal deficits, no tremors   Data Review   CBC w Diff:  Lab Results  Component Value Date   WBC 7.5 05/11/2021   HGB 11.2 (L) 05/11/2021   HCT 36.3 05/11/2021   HCT 41.9 12/27/2017   PLT 137 (L) 05/11/2021   LYMPHOPCT 11 05/11/2021   MONOPCT 9 05/11/2021   EOSPCT 0 05/11/2021   BASOPCT 0 05/11/2021    CMP:  Lab Results  Component  Value Date   NA 144 05/11/2021   K 4.2 05/11/2021   CL 111 05/11/2021   CO2 27 05/11/2021   BUN 21 05/11/2021   CREATININE 0.62 05/11/2021   CREATININE 0.77 01/14/2014   PROT 5.6 (L) 05/11/2021   ALBUMIN 3.1 (L) 05/11/2021   BILITOT 0.5 05/11/2021   ALKPHOS 55 05/11/2021   AST 18 05/11/2021   ALT 32 05/11/2021  . Total Discharge time is about 33 minutes  Susan Glass M.D on 05/11/2021 at 2:27 PM  Go to [email or web address] -  for contact info  [facility] - Office  [phone]

## 2021-05-11 NOTE — Plan of Care (Signed)

## 2021-05-11 NOTE — Progress Notes (Signed)
Pt had been on 3 lpm [place] O2 with SaO2 reading of 100%. O2 decreased to 1 lpm and SaO2 rechecked after 15 minutes, pt maintains SaO2 93-95%, no resp difficulty. MD Courage aware.

## 2021-05-11 NOTE — Progress Notes (Signed)
Speech Language Pathology Treatment: Dysphagia  Patient Details Name: Susan Glass MRN: [id number] DOB: 1917/05/14 Today's Date: 05/11/2021 Time: [id number] SLP Time Calculation (min) (ACUTE ONLY): 30 min  Assessment / Plan / Recommendation Clinical Impression  Ongoing diagnostic dysphagia therapy provided today; Pt was provided trials of thin liquids and with cup and tsp sips demonstrated immediate and delayed coughing with thin trials. No coughing or throat clearing was noted with NTL. Pt consumed puree textures without incident. Audible wheezing was present prior to PO trials. Suspect coughing and airway compromise is likely related to coordination of respiration and swallowing d/t compromised respiratory status. Recommend continue with conservative diet of D2/fine chop and NTL at this time with plan to advance to thin liquids clinically as appropriate. Recommend free water after oral care in between meals to facilitate hydration and QOL. SLP contacted SLP at SNF, [name] to communicate plan in the case that she is d/c before another treatment/opportunity to upgrade. ST will continue to follow,     HPI HPI: 87 y.o. female with past medical history relevant for chronic anemia of iron deficiency, HTN, HFpEF/chronic diastolic dysfunction CHF and history of CAD who admitted from [facility] on 8/82/8003 with acute hypoxic respiratory failure secondary to COVID-pneumonia as well as found to have UTI. RN noted coughing with liquids on Sunday, so a BSE was ordered.      SLP Plan  Continue with current plan of care      Recommendations for follow up therapy are one component of a multi-disciplinary discharge planning process, led by the attending physician.  Recommendations may be updated based on patient status, additional functional criteria and insurance authorization.    Recommendations  Diet recommendations: Dysphagia 2 (fine chop);Nectar-thick liquid Liquids provided via:  Cup;Straw Medication Administration: Whole meds with puree Supervision: Patient able to self feed Compensations: Slow rate;Small sips/bites Postural Changes and/or Swallow Maneuvers: Seated upright 90 degrees                Oral Care Recommendations: Oral care BID;Staff/trained caregiver to provide oral care Follow up Recommendations: 24 hour supervision/assistance SLP Visit Diagnosis: Dysphagia, unspecified (R13.10) Plan: Continue with current plan of care       Susan Glass H. Roddie Mc, CCC-SLP Speech Language Pathologist    Susan Glass  05/11/2021, 10:44 AM

## 2021-05-11 NOTE — TOC Transition Note (Signed)
Transition of Care [facility]) - CM/SW Discharge Note   Patient Details  Name: Susan Glass MRN: [id number] Date of Birth: Jul 18, 1917  [facility] CM/SW Contact:  Ihor Gully, LCSW Phone Number: 05/11/2021, 1:34 PM   Clinical Narrative:    Discharge clinicals sent to facility. Advised Daughter in law that patient was discharging. DIL indicated that patient's son had died and would be buried tomorrow. TOC signing off.   Final next level of care: Skilled Nursing Facility Barriers to Discharge: No Barriers Identified   Patient Goals and CMS Choice Patient states their goals for this hospitalization and ongoing recovery are:: return to SNF      Discharge Placement              Patient chooses bed at: Other - please specify in the comment section below: Susan Glass) Patient to be transferred to facility by: [facility] Name of family member notified: Susan Glass Patient and family notified of of transfer: 05/11/21  Discharge Plan and Services In-house Referral: Clinical Social Work              DME Arranged: N/A                    Social Determinants of Health (SDOH) Interventions     Readmission Risk Interventions Readmission Risk Prevention Plan 05/10/2021 03/23/2021  Medication Screening - Complete  Transportation Screening Complete Complete  HRI or Home Care Consult Complete -  Social Work Consult for [place] Planning/Counseling Complete -  Palliative Care Screening Not Applicable -  Medication Review [employer]) Complete -  Some recent data might be hidden

## 2021-06-08 ENCOUNTER — Encounter (HOSPITAL_COMMUNITY): Payer: Self-pay | Admitting: Radiology

## 2021-07-15 DEATH — deceased

## 2022-07-11 IMAGING — CT CT HEAD W/O CM
4 series · 16 of 47 positions shown, 18 images · non-contrast
Comparison: CT head 12/27/2017

CLINICAL DATA: Delirium, altered mental status

EXAM:
CT HEAD WITHOUT CONTRAST
TECHNIQUE: Contiguous axial images were obtained from the base of the skull
through the vertex without intravenous contrast.

[Series 2: head bone · axial · 0.43mm/px · z∈[-57,-25]mm · 3 of 80 slices shown]
[im 8/80  bone]
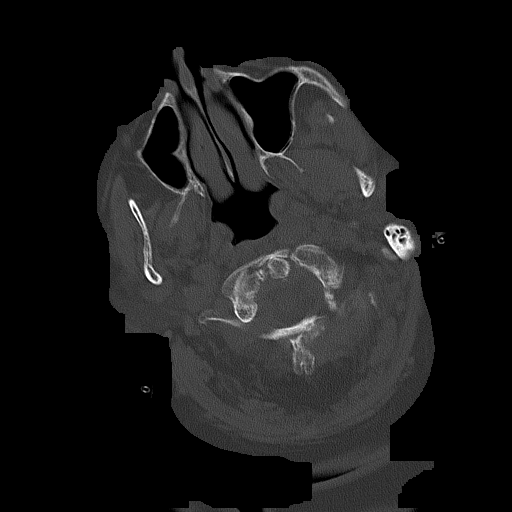
[im 16/80  bone]
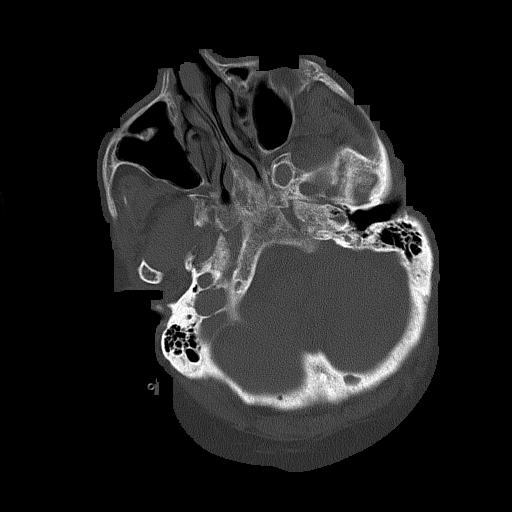
[im 24/80  bone]
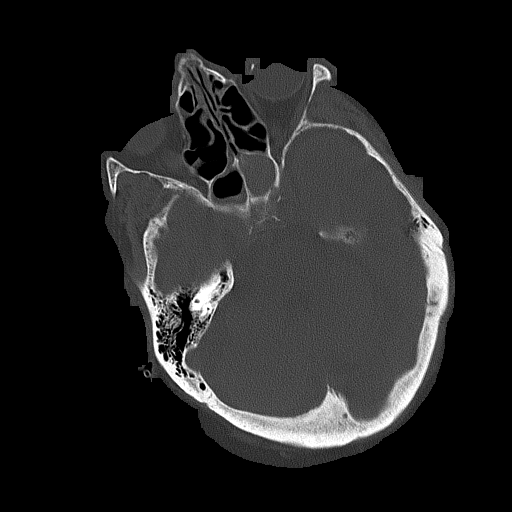

[Series 3: coronal soft · coronal · 0.33mm/px · 3 of 84 slices shown]
[im 28/84  brain]
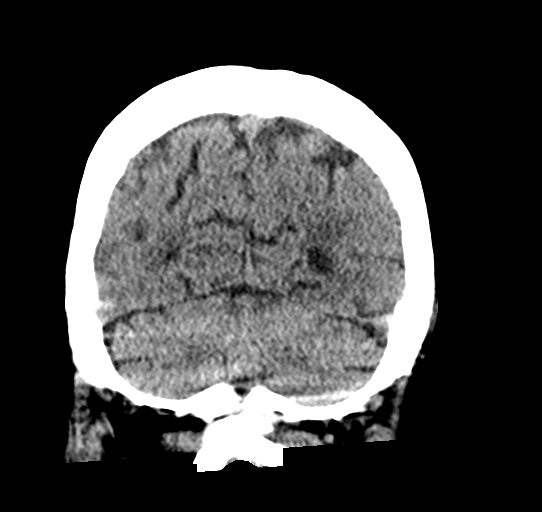
[im 37/84  brain]
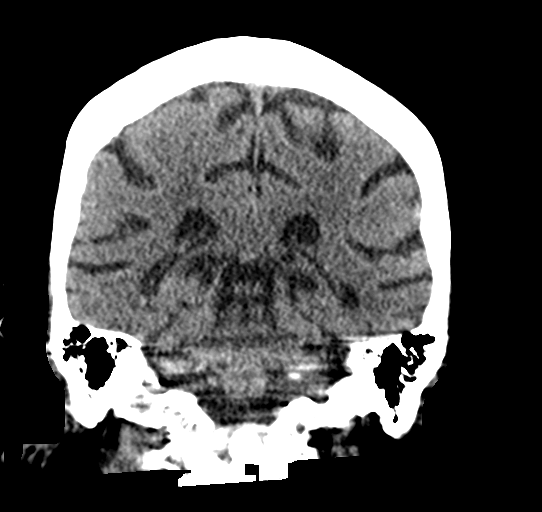
[im 47/84  brain]
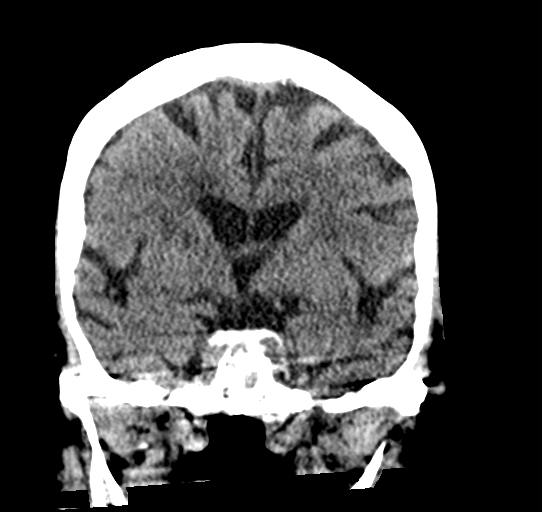

[Series 4: sagittal soft · sagittal · 0.33mm/px · 3 of 56 slices shown]
[im 19/56  brain]
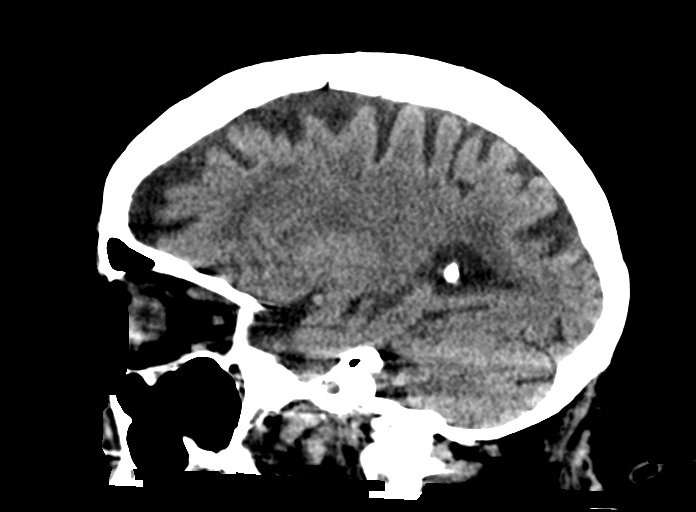
[im 28/56  brain]
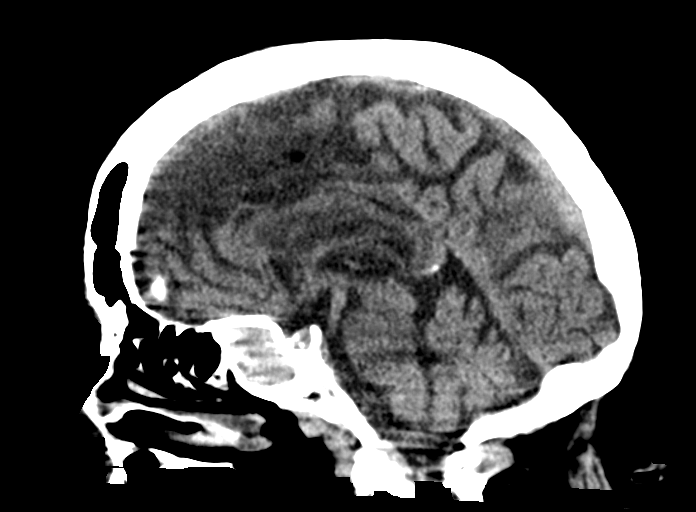
[im 37/56  brain]
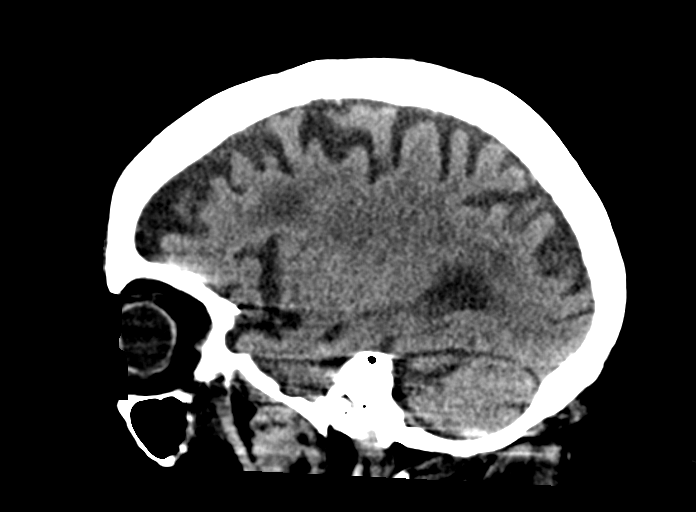

[Series 5: head w o · axial · 0.43mm/px · z∈[-56,+64]mm · 7 of 32 slices shown, 9 images]
[im 4/32  brain]
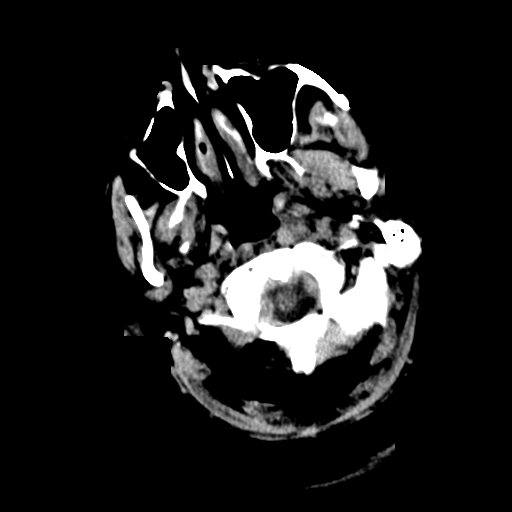
[im 4/32  bone]
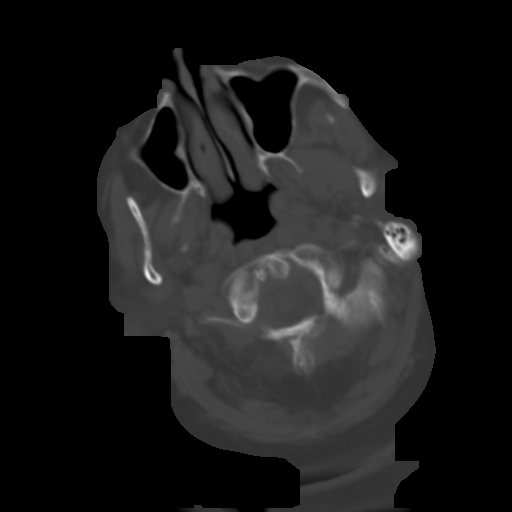
[im 8/32  brain]
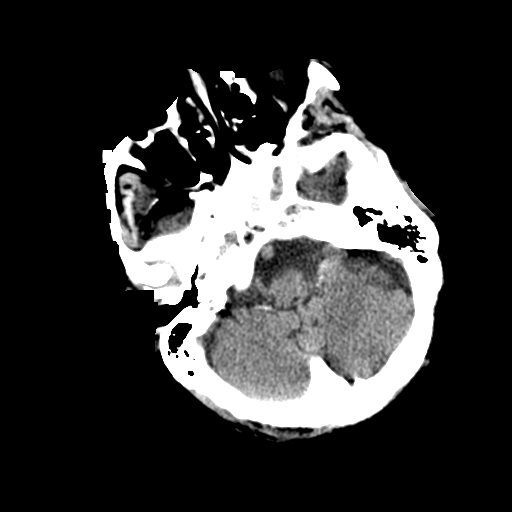
[im 12/32  brain]
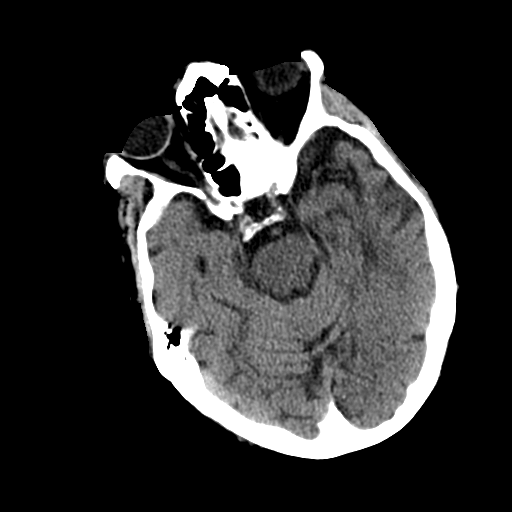
[im 16/32  brain]
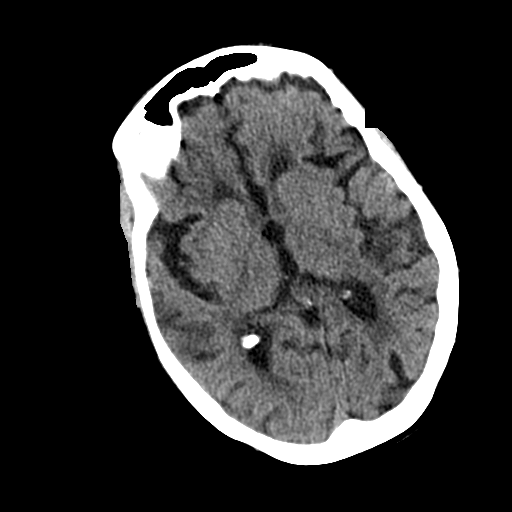
[im 20/32  brain]
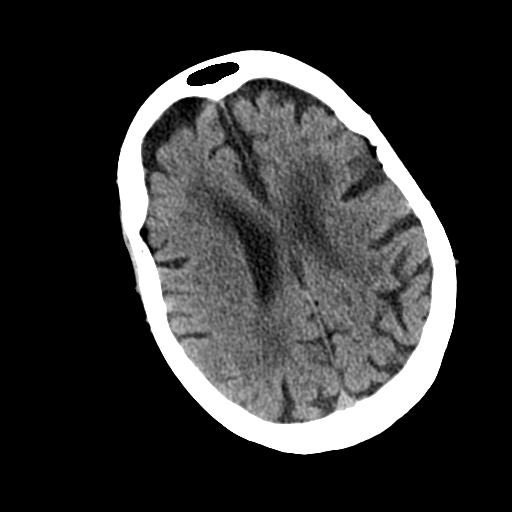
[im 20/32  bone]
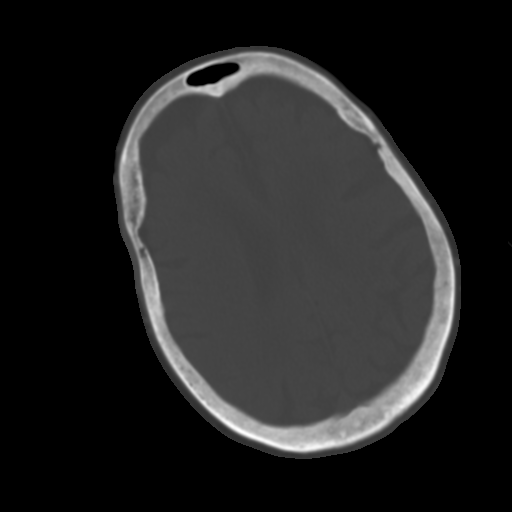
[im 24/32  brain]
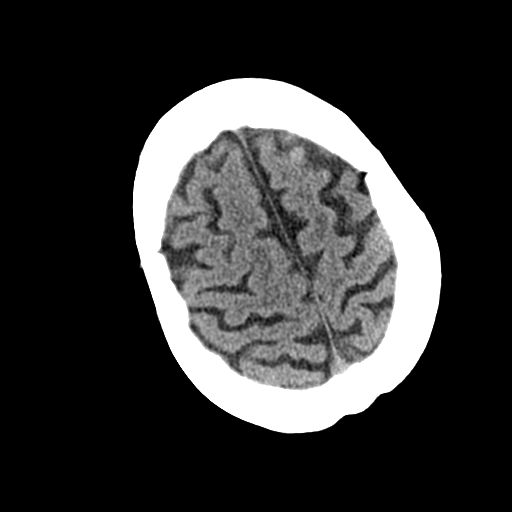
[im 28/32  brain]
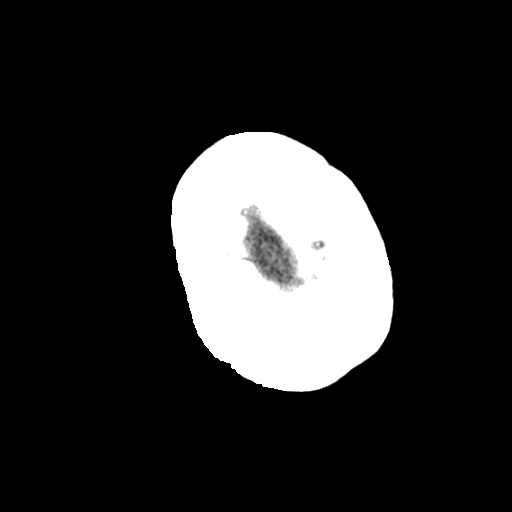

[16 of 47 positions shown; findings below may reference images not displayed]

FINDINGS: Brain: There is no evidence of acute intracranial hemorrhage,
extra-axial fluid collection, or acute infarct.

There is global parenchymal volume loss with commensurate
enlargement of the ventricular system and CSF spaces. Confluent
hypodensity in the subcortical and periventricular white matter
likely reflects sequela of chronic white matter microangiopathy.
There is no mass lesion. There is no midline shift.

Vascular: There is calcification of the bilateral cavernous ICAs.

Skull: Normal. Negative for fracture or focal lesion.

Sinuses/Orbits: There is complete opacification of the sphenoid
sinuses with associated hyperostosis. There is layering fluid in the
right sphenoid sinus. Bilateral lens implants are in place. The
globes and orbits are otherwise unremarkable.

Other: None
IMPRESSION: 1. No acute intracranial pathology.
2. Global parenchymal volume loss and chronic white matter
microangiopathy.
3. Chronic bilateral sphenoid sinusitis with layering fluid in the
right sphenoid sinus which could reflect superimposed acute
sinusitis in the correct clinical setting.
